# Patient Record
Sex: Female | Born: 1955 | Race: White | Hispanic: No | Smoking: Former smoker
Health system: Southern US, Community
[De-identification: ages and names within clinical notes are randomized; demographics above are authoritative.]

## PROBLEM LIST (undated history)

## (undated) DIAGNOSIS — G4730 Sleep apnea, unspecified: Secondary | ICD-10-CM

## (undated) DIAGNOSIS — T783XXA Angioneurotic edema, initial encounter: Secondary | ICD-10-CM

## (undated) DIAGNOSIS — L509 Urticaria, unspecified: Secondary | ICD-10-CM

## (undated) DIAGNOSIS — E039 Hypothyroidism, unspecified: Secondary | ICD-10-CM

## (undated) DIAGNOSIS — Z8669 Personal history of other diseases of the nervous system and sense organs: Secondary | ICD-10-CM

## (undated) DIAGNOSIS — I10 Essential (primary) hypertension: Secondary | ICD-10-CM

## (undated) DIAGNOSIS — F3181 Bipolar II disorder: Secondary | ICD-10-CM

## (undated) DIAGNOSIS — I313 Pericardial effusion (noninflammatory): Secondary | ICD-10-CM

## (undated) DIAGNOSIS — F329 Major depressive disorder, single episode, unspecified: Secondary | ICD-10-CM

## (undated) DIAGNOSIS — F410 Panic disorder [episodic paroxysmal anxiety] without agoraphobia: Secondary | ICD-10-CM

## (undated) DIAGNOSIS — R000 Tachycardia, unspecified: Secondary | ICD-10-CM

## (undated) DIAGNOSIS — T8859XA Other complications of anesthesia, initial encounter: Secondary | ICD-10-CM

## (undated) DIAGNOSIS — T56891A Toxic effect of other metals, accidental (unintentional), initial encounter: Secondary | ICD-10-CM

## (undated) DIAGNOSIS — G4733 Obstructive sleep apnea (adult) (pediatric): Secondary | ICD-10-CM

## (undated) DIAGNOSIS — E785 Hyperlipidemia, unspecified: Secondary | ICD-10-CM

## (undated) DIAGNOSIS — I309 Acute pericarditis, unspecified: Secondary | ICD-10-CM

## (undated) DIAGNOSIS — F419 Anxiety disorder, unspecified: Secondary | ICD-10-CM

## (undated) DIAGNOSIS — T4145XA Adverse effect of unspecified anesthetic, initial encounter: Secondary | ICD-10-CM

## (undated) HISTORY — PX: CERVICAL FUSION: SHX112

## (undated) HISTORY — DX: Obstructive sleep apnea (adult) (pediatric): G47.33

## (undated) HISTORY — PX: VAGINAL HYSTERECTOMY: SUR661

## (undated) HISTORY — DX: Angioneurotic edema, initial encounter: T78.3XXA

## (undated) HISTORY — DX: Tachycardia, unspecified: R00.0

## (undated) HISTORY — PX: NASAL SINUS SURGERY: SHX719

## (undated) HISTORY — DX: Toxic effect of other metals, accidental (unintentional), initial encounter: T56.891A

## (undated) HISTORY — DX: Bipolar II disorder: F31.81

## (undated) HISTORY — DX: Urticaria, unspecified: L50.9

## (undated) HISTORY — DX: Panic disorder (episodic paroxysmal anxiety): F41.0

## (undated) HISTORY — PX: BREAST SURGERY: SHX581

## (undated) HISTORY — PX: CHOLECYSTECTOMY: SHX55

## (undated) SURGERY — COLONOSCOPY
Anesthesia: Moderate Sedation

---

## 2012-11-02 DIAGNOSIS — R079 Chest pain, unspecified: Secondary | ICD-10-CM

## 2013-04-16 ENCOUNTER — Ambulatory Visit (INDEPENDENT_AMBULATORY_CARE_PROVIDER_SITE_OTHER): Payer: Medicare Other | Admitting: Psychiatry

## 2013-04-16 ENCOUNTER — Encounter (HOSPITAL_COMMUNITY): Payer: Self-pay | Admitting: Psychiatry

## 2013-04-16 VITALS — BP 140/80 | Ht 67.0 in | Wt 157.0 lb

## 2013-04-16 DIAGNOSIS — F332 Major depressive disorder, recurrent severe without psychotic features: Secondary | ICD-10-CM

## 2013-04-16 DIAGNOSIS — F32A Depression, unspecified: Secondary | ICD-10-CM

## 2013-04-16 DIAGNOSIS — F329 Major depressive disorder, single episode, unspecified: Secondary | ICD-10-CM

## 2013-04-16 HISTORY — DX: Major depressive disorder, single episode, unspecified: F32.9

## 2013-04-16 MED ORDER — ALPRAZOLAM 1 MG PO TABS
2.0000 mg | ORAL_TABLET | Freq: Every day | ORAL | Status: DC
Start: 2013-04-16 — End: 2013-04-16

## 2013-04-16 MED ORDER — VENLAFAXINE HCL ER 75 MG PO CP24: ORAL_CAPSULE | ORAL | Status: DC

## 2013-04-16 MED ORDER — ARIPIPRAZOLE 5 MG PO TABS: 5.0000 mg | ORAL_TABLET | Freq: Every day | ORAL | Status: DC

## 2013-04-16 MED ORDER — BUPROPION HCL ER (SR) 150 MG PO TB12: 150.0000 mg | ORAL_TABLET | Freq: Every day | ORAL | Status: DC

## 2013-04-16 MED ORDER — ALPRAZOLAM 1 MG PO TABS: 2.0000 mg | ORAL_TABLET | Freq: Every day | ORAL | Status: DC
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
## 2013-05-04 ENCOUNTER — Ambulatory Visit (INDEPENDENT_AMBULATORY_CARE_PROVIDER_SITE_OTHER): Payer: Medicare Other | Admitting: Psychiatry

## 2013-05-04 ENCOUNTER — Encounter (HOSPITAL_COMMUNITY): Payer: Self-pay | Admitting: Psychiatry

## 2013-05-04 DIAGNOSIS — F339 Major depressive disorder, recurrent, unspecified: Secondary | ICD-10-CM
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
## 2013-05-04 NOTE — Patient Instructions (Signed)
Discussed orally 

## 2013-05-14 ENCOUNTER — Encounter (HOSPITAL_COMMUNITY): Payer: Self-pay | Admitting: Psychiatry

## 2013-05-14 ENCOUNTER — Ambulatory Visit (INDEPENDENT_AMBULATORY_CARE_PROVIDER_SITE_OTHER): Payer: Medicare Other | Admitting: Psychiatry

## 2013-05-14 VITALS — BP 120/80 | Ht 67.0 in | Wt 160.0 lb

## 2013-05-14 DIAGNOSIS — F339 Major depressive disorder, recurrent, unspecified: Secondary | ICD-10-CM

## 2013-05-14 DIAGNOSIS — F332 Major depressive disorder, recurrent severe without psychotic features: Secondary | ICD-10-CM

## 2013-05-14 MED ORDER — VENLAFAXINE HCL ER 225 MG PO TB24: 225.0000 mg | ORAL_TABLET | ORAL | Status: DC
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
## 2013-05-19 ENCOUNTER — Ambulatory Visit (HOSPITAL_COMMUNITY): Payer: Self-pay | Admitting: Psychiatry

## 2013-05-28 ENCOUNTER — Ambulatory Visit (INDEPENDENT_AMBULATORY_CARE_PROVIDER_SITE_OTHER): Payer: Medicare Other | Admitting: Psychiatry

## 2013-05-28 DIAGNOSIS — F339 Major depressive disorder, recurrent, unspecified: Secondary | ICD-10-CM
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
## 2013-05-28 NOTE — Patient Instructions (Signed)
Discussed orally 

## 2013-06-16 ENCOUNTER — Ambulatory Visit (INDEPENDENT_AMBULATORY_CARE_PROVIDER_SITE_OTHER): Payer: Medicare Other | Admitting: Psychiatry

## 2013-06-16 DIAGNOSIS — F339 Major depressive disorder, recurrent, unspecified: Secondary | ICD-10-CM | POA: Diagnosis not present

## 2013-06-17 NOTE — Patient Instructions (Signed)
Discussed orally 

## 2013-06-17 NOTE — Progress Notes (Signed)
   THERAPIST PROGRESS NOTE  Session Time: Wednesday 06/16/2013 3:15 PM - 4:00 PM  Participation Level: Active  Behavioral Response: CasualAlertEuthymic  Type of Therapy: Individual Therapy  Treatment Goals addressed:  Improve ability to identify and verbalize anger and underlying feelings  Interventions: CBT and Supportive  Summary: Teresa Galloway is a 58 y.o. female who presents with  with a long-standing history of recurrent periods of depression and one psychiatric hospitalization.. She reports most recent episode was in 2012 but reports worsening symptoms began about 3-4 months ago  Patient reports ongoing stress and financial issues related to separation from her husband in 2013. However, she has experienced decreased anger and anxiety in the past 2-3 weeks as husband has been more cooperative and prompt regarding financial support. She stills worry about the future as she does know if husband will remain cooperative. Patient reports she has begun dating. She also reports improved concentration stating that she has resumed her interest in reading. She expresses frustration with self due to poor motivation regarding completing certain tasks. She also continues to have difficulty identifying and managing emotions especially anger as she reports tendency to internalize.  .   Suicidal/Homicidal: No  Therapist Response: Therapist works with patient to develop treatment plan, explore thinking patterns and effects on mood and behavior, identify realistic expectations of self, and increase emotional awareness.  Plan: Return again in 3 weeks.  Diagnosis: Axis I: Major depressive disorder    Axis II: Deferred    Teresa Labus, LCSW 06/17/2013

## 2013-06-24 ENCOUNTER — Ambulatory Visit (INDEPENDENT_AMBULATORY_CARE_PROVIDER_SITE_OTHER): Payer: Medicare Other | Admitting: Psychiatry

## 2013-06-24 ENCOUNTER — Encounter (HOSPITAL_COMMUNITY): Payer: Self-pay | Admitting: Psychiatry

## 2013-06-24 VITALS — BP 130/80 | Ht 67.0 in | Wt 158.0 lb

## 2013-06-24 DIAGNOSIS — F339 Major depressive disorder, recurrent, unspecified: Secondary | ICD-10-CM

## 2013-06-24 DIAGNOSIS — F332 Major depressive disorder, recurrent severe without psychotic features: Secondary | ICD-10-CM

## 2013-06-24 MED ORDER — VENLAFAXINE HCL ER 225 MG PO TB24: 225.0000 mg | ORAL_TABLET | ORAL | Status: DC

## 2013-06-24 MED ORDER — ALPRAZOLAM 1 MG PO TABS: 2.0000 mg | ORAL_TABLET | Freq: Every day | ORAL | Status: DC

## 2013-06-24 MED ORDER — BUPROPION HCL ER (SR) 150 MG PO TB12: 150.0000 mg | ORAL_TABLET | Freq: Every day | ORAL | Status: DC

## 2013-06-24 MED ORDER — ARIPIPRAZOLE 5 MG PO TABS: 5.0000 mg | ORAL_TABLET | Freq: Every day | ORAL | Status: DC
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
## 2013-08-24 ENCOUNTER — Ambulatory Visit (HOSPITAL_COMMUNITY): Payer: Self-pay | Admitting: Psychiatry

## 2013-12-07 DIAGNOSIS — I309 Acute pericarditis, unspecified: Secondary | ICD-10-CM

## 2013-12-07 HISTORY — DX: Acute pericarditis, unspecified: I30.9

## 2014-01-02 ENCOUNTER — Encounter (HOSPITAL_COMMUNITY): Payer: Self-pay | Admitting: Cardiology

## 2014-01-02 ENCOUNTER — Observation Stay (HOSPITAL_COMMUNITY)
Admission: AD | Admit: 2014-01-02 | Discharge: 2014-01-03 | Disposition: A | Discharge status: Home / Self Care | Payer: Medicare Other | Source: Other Acute Inpatient Hospital | Attending: Cardiology | Admitting: Cardiology

## 2014-01-02 DIAGNOSIS — I4581 Long QT syndrome: Secondary | ICD-10-CM

## 2014-01-02 DIAGNOSIS — I3139 Other pericardial effusion (noninflammatory): Secondary | ICD-10-CM | POA: Diagnosis present

## 2014-01-02 DIAGNOSIS — Z79899 Other long term (current) drug therapy: Secondary | ICD-10-CM | POA: Diagnosis not present

## 2014-01-02 DIAGNOSIS — R072 Precordial pain: Secondary | ICD-10-CM | POA: Diagnosis not present

## 2014-01-02 DIAGNOSIS — Z8669 Personal history of other diseases of the nervous system and sense organs: Secondary | ICD-10-CM

## 2014-01-02 DIAGNOSIS — Z87891 Personal history of nicotine dependence: Secondary | ICD-10-CM | POA: Insufficient documentation

## 2014-01-02 DIAGNOSIS — E079 Disorder of thyroid, unspecified: Secondary | ICD-10-CM | POA: Diagnosis not present

## 2014-01-02 DIAGNOSIS — I319 Disease of pericardium, unspecified: Principal | ICD-10-CM | POA: Insufficient documentation

## 2014-01-02 DIAGNOSIS — I309 Acute pericarditis, unspecified: Secondary | ICD-10-CM

## 2014-01-02 DIAGNOSIS — I313 Pericardial effusion (noninflammatory): Secondary | ICD-10-CM | POA: Diagnosis present

## 2014-01-02 DIAGNOSIS — R079 Chest pain, unspecified: Secondary | ICD-10-CM | POA: Diagnosis present

## 2014-01-02 DIAGNOSIS — E039 Hypothyroidism, unspecified: Secondary | ICD-10-CM | POA: Diagnosis present

## 2014-01-02 HISTORY — DX: Personal history of other diseases of the nervous system and sense organs: Z86.69

## 2014-01-02 HISTORY — DX: Hypothyroidism, unspecified: E03.9

## 2014-01-02 HISTORY — DX: Pericardial effusion (noninflammatory): I31.3

## 2014-01-02 HISTORY — DX: Acute pericarditis, unspecified: I30.9

## 2014-01-02 HISTORY — DX: Anxiety disorder, unspecified: F41.9

## 2014-01-02 HISTORY — DX: Hyperlipidemia, unspecified: E78.5

## 2014-01-02 HISTORY — DX: Major depressive disorder, single episode, unspecified: F32.9

## 2014-01-02 LAB — TROPONIN I: Troponin I: <0.3 ng/mL (ref <0.30)

## 2014-01-02 LAB — TSH: TSH: 2.61 u[IU]/mL (ref 0.350–4.500)

## 2014-01-02 LAB — SEDIMENTATION RATE: Sed Rate: 42 mm/hr — ABNORMAL HIGH (ref 0–22)

## 2014-01-02 MED ORDER — PROPRANOLOL HCL ER BEADS 80 MG PO CP24: 80.0000 mg | ORAL_CAPSULE | Freq: Every morning | ORAL | Status: DC

## 2014-01-02 MED ORDER — LEVOTHYROXINE SODIUM 75 MCG PO TABS
75.0000 ug | ORAL_TABLET | Freq: Every day | ORAL | Status: DC
  Administered 2014-01-02 – 2014-01-03 (×2): 75 ug via ORAL
  Filled 2014-01-02 (×3): qty 1

## 2014-01-02 MED ORDER — VENLAFAXINE HCL ER 150 MG PO CP24
300.0000 mg | ORAL_CAPSULE | Freq: Every day | ORAL | Status: DC
  Administered 2014-01-02 – 2014-01-03 (×2): 300 mg via ORAL
  Filled 2014-01-02 (×3): qty 2

## 2014-01-02 MED ORDER — MORPHINE SULFATE 2 MG/ML IJ SOLN
1.0000 mg | INTRAMUSCULAR | Status: DC | PRN
  Administered 2014-01-02 (×2): 2 mg via INTRAVENOUS
  Filled 2014-01-02 (×2): qty 1

## 2014-01-02 MED ORDER — PANTOPRAZOLE SODIUM 40 MG PO TBEC
40.0000 mg | DELAYED_RELEASE_TABLET | Freq: Every day | ORAL | Status: DC
Start: 2014-01-02 — End: 2014-01-03
  Administered 2014-01-02 – 2014-01-03 (×2): 40 mg via ORAL
  Filled 2014-01-02 (×2): qty 1

## 2014-01-02 MED ORDER — VENLAFAXINE HCL ER 150 MG PO CP24: 150.0000 mg | ORAL_CAPSULE | Freq: Two times a day (BID) | ORAL | Status: DC

## 2014-01-02 MED ORDER — COLCHICINE 0.6 MG PO TABS
0.6000 mg | ORAL_TABLET | Freq: Two times a day (BID) | ORAL | Status: DC
  Administered 2014-01-02 – 2014-01-03 (×3): 0.6 mg via ORAL
  Filled 2014-01-02 (×4): qty 1

## 2014-01-02 MED ORDER — ACETAMINOPHEN 325 MG PO TABS: 650.0000 mg | ORAL_TABLET | ORAL | Status: DC | PRN

## 2014-01-02 MED ORDER — ALPRAZOLAM 0.5 MG PO TABS
0.5000 mg | ORAL_TABLET | Freq: Two times a day (BID) | ORAL | Status: DC | PRN
  Administered 2014-01-02 – 2014-01-03 (×2): 0.5 mg via ORAL
  Filled 2014-01-02 (×2): qty 1

## 2014-01-02 MED ORDER — IBUPROFEN 400 MG PO TABS
400.0000 mg | ORAL_TABLET | Freq: Three times a day (TID) | ORAL | Status: DC
  Administered 2014-01-02 (×3): 400 mg via ORAL
  Filled 2014-01-02 (×6): qty 1

## 2014-01-02 MED ORDER — BUPROPION HCL ER (SR) 150 MG PO TB12
150.0000 mg | ORAL_TABLET | Freq: Every day | ORAL | Status: DC
  Administered 2014-01-02 – 2014-01-03 (×2): 150 mg via ORAL
  Filled 2014-01-02 (×2): qty 1

## 2014-01-02 MED ORDER — ONDANSETRON HCL 4 MG/2ML IJ SOLN: 4.0000 mg | Freq: Four times a day (QID) | INTRAMUSCULAR | Status: DC | PRN

## 2014-01-02 MED ORDER — PROPRANOLOL HCL ER 80 MG PO CP24
80.0000 mg | ORAL_CAPSULE | Freq: Every day | ORAL | Status: DC
  Administered 2014-01-02: 80 mg via ORAL
  Filled 2014-01-02 (×2): qty 1

## 2014-01-02 NOTE — Progress Notes (Signed)
  Echocardiogram 2D Echocardiogram has been performed.  Mauricio Po 01/02/2014, 9:39 AM

## 2014-01-02 NOTE — Progress Notes (Signed)
Subjective:  Still was inserted chest pain, worse if bends forward. No tachycardia.  Objective:  Vital Signs in the last 24 hours: BP 84/57  Pulse 84  Temp(Src) 97.7 F (36.5 C) (Oral)  Resp 16  Ht 5' 6.5" (1.689 m)  Wt 70.943 kg (156 lb 6.4 oz)  BMI 24.87 kg/m2  SpO2 99%  Physical Exam: Pleasant, mildly anxious white female in no acute distress Lungs:  Clear Neck: Neck veins not elevated Cardiac:  Regular rhythm, normal S1 and S2, no S3, no rub heard Abdomen:  Soft, nontender, no masses Extremities:  No edema present  Weight Filed Weights   01/02/14 0552  Weight: 70.943 kg (156 lb 6.4 oz)   Telemetry: Sinus rhythm  Assessment/Plan:  1. Pleuritic chest pain which is positional suggestive of pericarditis  2. Moderate pericardial effusion by CT scan-echo pending, blood pressure is on the low side 3. Hypothyroidism under treatment  Recommendations:  Await echocardiogram. Obtain ANA, sedimentation rate and TSH. Continue anti-inflammatory and colchicine .     Kerry Hough  MD [facility]  01/02/2014, 8:43 AM

## 2014-01-02 NOTE — Progress Notes (Signed)
Utilization Review Completed.   Zephaniah Lubrano, RN, BSN Nurse Case Manager  

## 2014-01-02 NOTE — H&P (Signed)
CARDIOLOGY ADMISSION NOTE  Patient ID: CADIENCE Galloway MRN: [id number] DOB/AGE: July 04, 1955 58 y.o.  Admit date: 01/02/2014 Primary Physician   Dr. Woody Seller Primary Cardiologist   None Chief Complaint    Chest pain  HPI:  The patient has no past cardiac history.  She did have a diagnosis of pleurisy recently.  She said that two days ago chest pain started that was the same as her pleuritic pain.  It was worse with deep breathing and bending over.  It would be 9/10 with a deep breath.  It was sharp and mid chest.  She could not take a deep breath but was not having PND or orthopnea.  She denies neck or arm pain.  She has had low grade fever.  She presented to [facility].  CT of the chest did not demonstrate PE but did suggest a moderate pericardial effusion.  Cardiac enzymes were negative x 1.  BNP was normal.     Past Medical History  Diagnosis Date  . Thyroid disease   . EPPIRJJO(841.6)     Past Surgical History  Procedure Laterality Date  . Abdominal hysterectomy    . Breast surgery    . Spine surgery    . Cholecystectomy    . Nasal sinus surgery      Allergies  Allergen Reactions  . Sulfa Antibiotics    No current facility-administered medications on file prior to encounter.   Current Outpatient Prescriptions on File Prior to Encounter  Medication Sig Dispense Refill  . ALPRAZolam (XANAX) 1 MG tablet Take 2 tablets (2 mg total) by mouth at bedtime.  60 tablet  2  . ARIPiprazole (ABILIFY) 5 MG tablet Take 1 tablet (5 mg total) by mouth daily.  30 tablet  2  . buPROPion (WELLBUTRIN SR) 150 MG 12 hr tablet Take 1 tablet (150 mg total) by mouth daily.  30 tablet  2  . esomeprazole (NEXIUM) 40 MG capsule Take 40 mg by mouth daily at 12 noon.      Marland Kitchen levothyroxine (SYNTHROID, LEVOTHROID) 75 MCG tablet Take 75 mcg by mouth daily before breakfast.      . propranolol (INNOPRAN XL) 80 MG 24 hr capsule Take 80 mg by mouth every morning.      . Venlafaxine HCl 225 MG TB24 Take 1 tablet (225  mg total) by mouth every morning.  30 each  2   History   Social History  . Marital Status: Married    Spouse Name: N/A    Number of Children: N/A  . Years of Education: N/A   Occupational History  . Not on file.   Social History Main Topics  . Smoking status: Former Research scientist (life sciences)  . Smokeless tobacco: Not on file  . Alcohol Use: 1.8 oz/week    3 Glasses of wine per week  . Drug Use: No  . Sexual Activity: No   Other Topics Concern  . Not on file   Social History Narrative  . No narrative on file    Family History  Problem Relation Age of Onset  . Depression Mother   . Depression Father   . Depression Sister   . Anxiety disorder Sister   . Alcohol abuse Maternal Uncle   . Alcohol abuse Paternal Uncle   . Alcohol abuse Maternal Grandfather   . Depression Maternal Grandmother      ROS:  Heavy sweating.  Otherwise as stated in the HPI and negative for all other systems.  Physical Exam: Blood pressure  84/57, pulse 84, temperature 97.7 F (36.5 C), temperature source Oral, height 5' 6.5" (1.689 m), weight 156 lb 6.4 oz (70.943 kg), SpO2 99.00%.  GENERAL:  Well appearing HEENT:  Pupils equal round and reactive, fundi not visualized, oral mucosa unremarkable NECK:  No jugular venous distention, waveform within normal limits, carotid upstroke brisk and symmetric, no bruits, no thyromegaly LYMPHATICS:  No cervical, inguinal adenopathy LUNGS:  Clear to auscultation bilaterally BACK:  No CVA tenderness CHEST:  Unremarkable HEART:  PMI not displaced or sustained,S1 and S2 within normal limits, no S3, no S4, no clicks, no rubs, no murmurs ABD:  Flat, positive bowel sounds normal in frequency in pitch, no bruits, no rebound, no guarding, no midline pulsatile mass, no hepatomegaly, no splenomegaly EXT:  2 plus pulses throughout, no edema, no cyanosis no clubbing SKIN:  No rashes no nodules NEURO:  Cranial nerves II through XII grossly intact, motor grossly intact throughout PSYCH:   Cognitively intact, oriented to person place and time  Labs:  Na 133, BUN 16, creat .77, potassium 4.1, WBC 11.3, Hbg 12.6, platelets 238.      Radiology:  CT:  Emphysematous changes, fibrosis, moderate effusion, no pulmonary embolism  EKG:  NSR, RATE 104, AXIS wnl, QT prolonged, nonspecific diffuse ST T wave changes.  01/01/2014  ASSESSMENT AND PLAN:    CHEST PAIN:  I suspect that this is pleuritic and related possibly to pericarditis although I do not appreciate a rub and there are no diagnostic EKG changes.  I will treat with Motrin and colchicine.  Check echocardiogram.  Continue to cycle enzymes.  PROLONGED QT:   Avoid further QT prolonging drugs.  I suspect this is med related.  She has had no symptoms to suggest syndrome.  DEPRESSION:  Continue previous meds.   SignedMinus Breeding 01/02/2014, 6:16 AM

## 2014-01-03 ENCOUNTER — Encounter (HOSPITAL_COMMUNITY): Payer: Self-pay | Admitting: Physician Assistant

## 2014-01-03 DIAGNOSIS — E785 Hyperlipidemia, unspecified: Secondary | ICD-10-CM | POA: Insufficient documentation

## 2014-01-03 DIAGNOSIS — I319 Disease of pericardium, unspecified: Secondary | ICD-10-CM | POA: Diagnosis not present

## 2014-01-03 DIAGNOSIS — I3139 Other pericardial effusion (noninflammatory): Secondary | ICD-10-CM

## 2014-01-03 DIAGNOSIS — F411 Generalized anxiety disorder: Secondary | ICD-10-CM | POA: Insufficient documentation

## 2014-01-03 DIAGNOSIS — R072 Precordial pain: Secondary | ICD-10-CM

## 2014-01-03 DIAGNOSIS — I309 Acute pericarditis, unspecified: Secondary | ICD-10-CM

## 2014-01-03 DIAGNOSIS — E039 Hypothyroidism, unspecified: Secondary | ICD-10-CM | POA: Diagnosis present

## 2014-01-03 DIAGNOSIS — I313 Pericardial effusion (noninflammatory): Secondary | ICD-10-CM

## 2014-01-03 DIAGNOSIS — Z8669 Personal history of other diseases of the nervous system and sense organs: Secondary | ICD-10-CM

## 2014-01-03 DIAGNOSIS — F419 Anxiety disorder, unspecified: Secondary | ICD-10-CM | POA: Insufficient documentation

## 2014-01-03 HISTORY — DX: Pericardial effusion (noninflammatory): I31.3

## 2014-01-03 HISTORY — DX: Other pericardial effusion (noninflammatory): I31.39

## 2014-01-03 LAB — ANA: Anti Nuclear Antibody(ANA): NEGATIVE

## 2014-01-03 MED ORDER — IBUPROFEN 600 MG PO TABS
600.0000 mg | ORAL_TABLET | Freq: Three times a day (TID) | ORAL | Status: DC
  Administered 2014-01-03: 600 mg via ORAL
  Filled 2014-01-03 (×3): qty 1

## 2014-01-03 MED ORDER — COLCHICINE 0.6 MG PO TABS: 0.6000 mg | ORAL_TABLET | Freq: Two times a day (BID) | ORAL | Status: DC

## 2014-01-03 MED ORDER — PROPRANOLOL HCL ER 60 MG PO CP24: 60.0000 mg | ORAL_CAPSULE | Freq: Every day | ORAL | Status: DC

## 2014-01-03 MED ORDER — IBUPROFEN 600 MG PO TABS: 600.0000 mg | ORAL_TABLET | Freq: Three times a day (TID) | ORAL | Status: DC

## 2014-01-03 MED ORDER — PROPRANOLOL HCL ER 60 MG PO CP24
60.0000 mg | ORAL_CAPSULE | Freq: Every day | ORAL | Status: DC
  Filled 2014-01-03: qty 1

## 2014-01-03 NOTE — Progress Notes (Signed)
  Patient: Teresa Galloway / Admit Date: 01/02/2014 / Date of Encounter: 01/03/2014, 8:00 AM   Subjective: Still with mild CP on inspiration but nowhere near where it was before. No SOB, orthopnea, LEE. Reports dx of pleurisy about a month ago with improvement, then this syndrome.   Objective: Telemetry: NSR Physical Exam: Blood pressure 95/51, pulse 88, temperature 98.3 F (36.8 C), temperature source Oral, resp. rate 18, height 5' 6.5" (1.689 m), weight 156 lb (70.761 kg), SpO2 98.00%. General: Well developed, well nourished, in no acute distress. Well-appearing. Head: Normocephalic, atraumatic, sclera non-icteric, no xanthomas, nares are without discharge. Neck: JVP not elevated. Lungs: Clear bilaterally to auscultation without wheezes, rales, or rhonchi. Breathing is unlabored. Heart: RRR S1 S2 without murmurs, rubs, or gallops.  Abdomen: Soft, non-tender, non-distended with normoactive bowel sounds. No rebound/guarding. Extremities: No clubbing or cyanosis. No edema. Distal pedal pulses are 2+ and equal bilaterally. Neuro: Alert and oriented X 3. Moves all extremities spontaneously. Psych:  Responds to questions appropriately with a normal affect.   Intake/Output Summary (Last 24 hours) at 01/03/14 0800 Last data filed at 01/02/14 2836  Gross per 24 hour  Intake    120 ml  Output      0 ml  Net    120 ml    Inpatient Medications:  . buPROPion  150 mg Oral Daily  . colchicine  0.6 mg Oral BID  . ibuprofen  400 mg Oral TID  . levothyroxine  75 mcg Oral QAC breakfast  . pantoprazole  40 mg Oral Daily  . propranolol ER  80 mg Oral Daily  . venlafaxine XR  300 mg Oral Q breakfast   Infusions:    Labs: troponins negative. ESR 32. TSH 2.610.   Recent Labs  01/02/14 0850 01/02/14 1051 01/02/14 1524  TROPONINI <0.30 <0.30 <0.30   Radiology/Studies:  2D echo 01/02/14 - Left ventricle: The cavity size was normal. Systolic function was normal. The estimated ejection  fraction was in the range of 55% to 60%. Wall motion was normal; there were no regional wall motion abnormalities. - Pericardium, extracardiac: A moderate pericardial effusion was identified. There was no echocardiographic evidence for tamponade physiology.    Assessment and Plan  1. Acute pericarditis 2. Pericardial effusion, moderate by echo 01/02/14 3. Hypothyroidism, TSH wnl 01/02/14 4. Migraines, on propranolol  ESR mildly elevated at 42. ANA pending - may not be back by time of DC. Being treated with moderate dose ibuprofen ($RemoveBeforeD'400mg'[id number]$ ) TID and colchicine. Might consider going up a little on the ibuprofen at DC. Would continue this until seen back in the office, and continue colchicine for 3 months. Echo with moderate effusion but no tamponade physiology. BP soft but not tachycardic - will cut propranolol to $RemoveBefore'60mg'[id number]$  daily. She prefers to f/u in [place] where she lives.  Signed, Melina Copa PA-C

## 2014-01-03 NOTE — Progress Notes (Signed)
Discharge Summary   Patient ID: Teresa Galloway MRN: [id number], DOB/AGE: 08/06/1955 58 y.o. Admit date: 01/02/2014 D/C date:     01/03/2014  Primary Care Provider: No PCP Per Patient Primary Cardiologist: Will be Dr. Harl Bowie in [place]  Primary Discharge Diagnoses:  1. Acute pericarditis - chest pain due to this 2. Pericardial effusion, moderate by echo 01/02/14  3. Hypothyroidism, TSH wnl 01/02/14  4. Migraines, on propranolol - dose decreased due to soft BPs  Secondary Discharge Diagnoses:  1. Reported h/o dyslipidemia 2. Depression 3. Anxiety  Hospital Course: [name] is a 58 y/o F with history of hypothyroidism, migraines, dyslipidemia, depression/anxiety who presented to [facility] initially on 01/01/2014 with chest pain. She did have a diagnosis of pleurisy recently but had improved from that. Two days prior to this admission, the chest pain started up again that was the same sharp quality as her pleuritic pain although it radiated upward to her chest instead of staying in the lower sternal region. It was worse with deep breathing up to 9/10 as well as bending over. She denied neck or arm pain. She has had low grade fever. At [facility], CT of the chest did not demonstrate PE but did suggest a moderate pericardial effusion. Cardiac enzymes were negative x 1. BNP was normal and EKG showed NSR with nonspecific ST-T changes. QTc was reported as prolonged at 503 on [facility] EKG, but handcalculated was 384m. She did not have any symptoms to suggest syndrome. WBC was 11.3. [facility]. Troponins were negative x 3. TSH was wnl. She was started on ibuproften TID and colchicine with clinical improvement in symptoms. ESR 42. 2D echo showed EF 55-60%, no RWMA, moderate pericardial effusion. She had softer BPs so her propranolol was cut from 844mto 6053maily (she takes this for migraines). She was not tachycardic and had no  evidence of tamponade or hemodynamic compromise. Today she feels much better. Dr. TurRadford Paxs seen and examined the patient today and feels she is stable for discharge. ANA is pending at time of DC.  The plan is to continue ibuprofen 600m64mD until re-evaluated in the office, and to continue colchicine for 3 months. She will be seen back in the office in about a week at which time we can determine timing of f/u echo to reassess effusion. She does not currently work so a work note was not needed. I gave her the following specific instructions on her D/c paperwork: - Since your blood pressure was on the lower side, we lowered your dose of Propranolol (new prescription). - You will continue ibuprofen every 8 hours *scheduled* until seen in the office - at your appointment, talk to your doctor about whether you can stop this medicine or taper down. Please take this with food. If you notice any unusual rectal bleeding, dark stools, or stomach upset, call your doctor. - The plan is to take colchicine for 3 months. - In general, you should be careful about taking black cohosh and ibuprofen together since black cohosh can increase risk of liver damage. If possible, please stop black cohosh while taking ibuprofen.    Discharge Vitals: Blood pressure 115/52, pulse 87, temperature 98.4 F (36.9 C), temperature source Oral, resp. rate 18, height 5' 6.5" (1.689 m), weight 156 lb (70.761 kg), SpO2 97.00%.  Labs: Albumin 4.0, alk phos 76, ALT 25, AST 18, BNP 56, BUN 16, Calcium 8.7, Cl 101, CPK  0.8, Cr 0.77, D-dimer 0.58, glucose 111, Hgb 12.6, Hct 37.5, Na 133, Total bili 1.0, Total protein 7.0, troponin negative, WBC 11.3 BNP 56   Recent Labs  01/02/14 0850 01/02/14 1051 01/02/14 1524  TROPONINI <0.30 <0.30 <0.30    Diagnostic Studies/Procedures   CTA of Chest 01/02/14: no evidence of significant PE. Moderate sized pericardial effusion. Emphysematous changes and fibrosis in the lungs. Atelectasis in the  lung bases.  CXR 01/01/14: no active CP disease.  2D Echo 01/02/14 - Left ventricle: The cavity size was normal. Systolic function was normal. The estimated ejection fraction was in the range of 55% to 60%. Wall motion was normal; there were no regional wall motion abnormalities. - Pericardium, extracardiac: A moderate pericardial effusion was identified. There was no echocardiographic evidence for tamponade physiology.  Discharge Medications   Current Discharge Medication List    START taking these medications   Details  colchicine 0.6 MG tablet Take 1 tablet (0.6 mg total) by mouth 2 (two) times daily. Qty: 60 tablet, Refills: 2    propranolol ER (INDERAL LA) 60 MG 24 hr capsule Take 1 capsule (60 mg total) by mouth daily. Qty: 30 capsule, Refills: 2      CONTINUE these medications which have CHANGED   Details  ibuprofen (ADVIL,MOTRIN) 600 MG tablet Take 1 tablet (600 mg total) by mouth every 8 (eight) hours. Qty: 45 tablet, Refills: 0      CONTINUE these medications which have NOT CHANGED   Details  ALPRAZolam (XANAX) 0.5 MG tablet Take 0.5 mg by mouth at bedtime as needed for anxiety.    Black Cohosh 40 MG CAPS Take 1 capsule by mouth 2 (two) times daily.    buPROPion (WELLBUTRIN SR) 200 MG 12 hr tablet Take 200 mg by mouth daily.    HYDROcodone-acetaminophen (NORCO/VICODIN) 5-325 MG per tablet Take 2 tablets by mouth every 6 (six) hours as needed for moderate pain.    levothyroxine (SYNTHROID, LEVOTHROID) 75 MCG tablet Take 75 mcg by mouth daily before breakfast.    pantoprazole (PROTONIX) 40 MG tablet Take 40 mg by mouth daily.    venlafaxine XR (EFFEXOR-XR) 150 MG 24 hr capsule Take 300 mg by mouth daily with breakfast.      STOP taking these medications     propranolol (INNOPRAN XL) 80 MG 24 hr capsule - replaced with 66m capsule      esomeprazole (NEXIUM) 40 MG capsule - patient now taking protonix at home         Disposition   The patient will be  discharged in stable condition to home. Discharge Instructions   Diet - low sodium heart healthy    Complete by:  As directed      Increase activity slowly    Complete by:  As directed   - Since your blood pressure was on the lower side, we lowered your dose of Propranolol (new prescription). - You will continue ibuprofen every 8 hours *scheduled* until seen in the office - at your appointment, talk to your doctor about whether you can stop this medicine or taper down. Please take this with food. If you notice any unusual rectal bleeding, dark stools, or stomach upset, call your doctor. - The plan is to take colchicine for 3 months. - In general, you should be careful about taking black cohosh and ibuprofen together since black cohosh can increase risk of liver damage. If possible, please stop black cohosh while taking ibuprofen.  Follow-up Information   Follow up with Arnoldo Lenis, MD. ([facility] office) - 01/12/14 at 2:20pm)    Specialty:  Cardiology   Contact information:   [place] [id number] [phone]         Duration of Discharge Encounter: Greater than 30 minutes including physician and PA time.  Signed, Mercie Balsley PA-C 01/03/2014, 9:12 AM

## 2014-01-03 NOTE — Progress Notes (Signed)
Patient seen and personally examined and chart reviewed.  Chest pain better this am on Ibuprofen and colchicine.  2D echo with moderate pericardial effusion with no tamponade.  EKG with nonspecific T wave changes in V1 and V2 most likely due to pericarditis.  Sed rate elevated at 42.  Cardiac enzymes neg x 3.  Symptomatically improved and stable for D/C.  She will followup with Cardiology in [place] in 1 week with repeat echo to make sure effusion is resolving.

## 2014-01-12 ENCOUNTER — Telehealth: Payer: Self-pay | Admitting: *Deleted

## 2014-01-12 ENCOUNTER — Ambulatory Visit: Payer: Self-pay | Admitting: Cardiology

## 2014-01-12 ENCOUNTER — Encounter: Payer: Self-pay | Admitting: Cardiology

## 2014-01-12 ENCOUNTER — Ambulatory Visit (INDEPENDENT_AMBULATORY_CARE_PROVIDER_SITE_OTHER): Payer: Medicare Other | Admitting: Cardiology

## 2014-01-12 VITALS — BP 118/74 | HR 108 | Ht 66.0 in | Wt 155.0 lb

## 2014-01-12 DIAGNOSIS — I313 Pericardial effusion (noninflammatory): Secondary | ICD-10-CM

## 2014-01-12 DIAGNOSIS — I309 Acute pericarditis, unspecified: Secondary | ICD-10-CM

## 2014-01-12 DIAGNOSIS — I3139 Other pericardial effusion (noninflammatory): Secondary | ICD-10-CM

## 2014-01-12 DIAGNOSIS — I319 Disease of pericardium, unspecified: Secondary | ICD-10-CM

## 2014-01-12 MED ORDER — COLCHICINE 0.6 MG PO TABS: 0.6000 mg | ORAL_TABLET | Freq: Every day | ORAL | Status: DC

## 2014-01-12 NOTE — Patient Instructions (Signed)
   Stop Ibuprofen  Decrease Colchicine to 0.6mg  once a day. New sent to pharmacy. Continue all other medications.   Your physician has requested that you have an echocardiogram. Echocardiography is a painless test that uses sound waves to create images of your heart. It provides your doctor with information about the size and shape of your heart and how well your heart's chambers and valves are working. This procedure takes approximately one hour. There are no restrictions for this procedure. Office will contact with results via phone or letter.   Your physician wants you to follow up in:  3 months.  You will receive a reminder letter in the mail one-two months in advance.  If you don't receive a letter, please call our office to schedule the follow up appointment.

## 2014-01-12 NOTE — Telephone Encounter (Signed)
It is fine to have a flu shot   Zandra Abts MD

## 2014-01-12 NOTE — Telephone Encounter (Signed)
Pt.notified

## 2014-01-12 NOTE — Telephone Encounter (Signed)
Pt called today asking if it would okay for her to have a flu shot? Pt states that while she was in the hospital she was told to hold off because of everything that was going on with her.  Please advise.

## 2014-01-12 NOTE — Progress Notes (Signed)
Clinical Summary [name] is a 58 y.o.female last seen by PA Dunn, this is our first visit together. She is seen for the following medical problems.  1. Pericarditis - seen in [facility] 12/2013 with chest pain, clinically suspicious for pericarditis. CT at the time showed a moderate pericardial effusion, cardiac enzymes negative. EKG non-specific ST/T changes. ESR 42, ANA negative. Transferred to [facility] for management.  - echo LVEF 55-60%, moderate effusion, no tamponade physiology - started on ibuprofen and colchicine for pericarditis with improved symptoms. Plan from discharge symptoms was ibuprofen 660m tid, with extended course of colchicine x 3 months. Diarrhea started a few days ago. No stomach pain, just frequent loose stools - compliant with meds. Chest pain resolved since Saturday.      Past Medical History  Diagnosis Date  . Hypothyroid   . Hx of migraines     a. On propranolol.  . Dyslipidemia   . Depression   . Anxiety   . Acute pericarditis     a. Dx 12/2013.  .Marland KitchenPericardial effusion     a. Dx 12/2013 - moderate without hemodynamic compromise.     Allergies  Allergen Reactions  . Other Swelling    Walnuts  . Seroquel [Quetiapine] Other (See Comments)    Pychosis (foggy, uncoordinated, slurred speech)  . Sulfa Antibiotics   . Sunscreens   . Tape      Current Outpatient Prescriptions  Medication Sig Dispense Refill  . ALPRAZolam (XANAX) 0.5 MG tablet Take 0.5 mg by mouth at bedtime as needed for anxiety.      . Black Cohosh 40 MG CAPS Take 1 capsule by mouth 2 (two) times daily.      .Marland KitchenbuPROPion (WELLBUTRIN SR) 200 MG 12 hr tablet Take 200 mg by mouth daily.      . colchicine 0.6 MG tablet Take 1 tablet (0.6 mg total) by mouth 2 (two) times daily.  60 tablet  2  . HYDROcodone-acetaminophen (NORCO/VICODIN) 5-325 MG per tablet Take 2 tablets by mouth every 6 (six) hours as needed for moderate pain.      .Marland Kitchenibuprofen (ADVIL,MOTRIN) 600 MG tablet Take 1  tablet (600 mg total) by mouth every 8 (eight) hours.  45 tablet  0  . levothyroxine (SYNTHROID, LEVOTHROID) 75 MCG tablet Take 75 mcg by mouth daily before breakfast.      . pantoprazole (PROTONIX) 40 MG tablet Take 40 mg by mouth daily.      . propranolol ER (INDERAL LA) 60 MG 24 hr capsule Take 1 capsule (60 mg total) by mouth daily.  30 capsule  2  . venlafaxine XR (EFFEXOR-XR) 150 MG 24 hr capsule Take 300 mg by mouth daily with breakfast.       No current facility-administered medications for this visit.     Past Surgical History  Procedure Laterality Date  . Abdominal hysterectomy    . Breast surgery    . Cervical fusion    . Cholecystectomy    . Nasal sinus surgery       Allergies  Allergen Reactions  . Other Swelling    Walnuts  . Seroquel [Quetiapine] Other (See Comments)    Pychosis (foggy, uncoordinated, slurred speech)  . Sulfa Antibiotics   . Sunscreens   . Tape       Family History  Problem Relation Age of Onset  . Depression Mother   . Depression Father   . Depression Sister   . Anxiety disorder Sister   .  Alcohol abuse Maternal Uncle   . Alcohol abuse Paternal Uncle   . Alcohol abuse Maternal Grandfather   . Depression Maternal Grandmother      Social History [name] reports that she has quit smoking. Her smoking use included Cigarettes. She has a 30 pack-year smoking history. She does not have any smokeless tobacco history on file. [name] reports that she drinks about 1.8 ounces of alcohol per week.   Review of Systems CONSTITUTIONAL: No weight loss, fever, chills, weakness or fatigue.  HEENT: Eyes: No visual loss, blurred vision, double vision or yellow sclerae.No hearing loss, sneezing, congestion, runny nose or sore throat.  SKIN: No rash or itching.  CARDIOVASCULAR: per HPI RESPIRATORY: No shortness of breath, cough or sputum.  GASTROINTESTINAL: No anorexia, nausea, vomiting or diarrhea. No abdominal pain or blood.    GENITOURINARY: No burning on urination, no polyuria NEUROLOGICAL: No headache, dizziness, syncope, paralysis, ataxia, numbness or tingling in the extremities. No change in bowel or bladder control.  MUSCULOSKELETAL: No muscle, back pain, joint pain or stiffness.  LYMPHATICS: No enlarged nodes. No history of splenectomy.  PSYCHIATRIC: No history of depression or anxiety.  ENDOCRINOLOGIC: No reports of sweating, cold or heat intolerance. No polyuria or polydipsia.  Marland Kitchen   Physical Examination p 108 bp 118/74 Wt 155 lbs BMI 25 Gen: resting comfortably, no acute distress HEENT: no scleral icterus, pupils equal round and reactive, no palptable cervical adenopathy,  CV: regular, rate 105, no m/r/g, no JVD Resp: Clear to auscultation bilaterally GI: abdomen is soft, non-tender, non-distended, normal bowel sounds, no hepatosplenomegaly MSK: extremities are warm, no edema.  Skin: warm, no rash Neuro:  no focal deficits Psych: appropriate affect   Diagnostic Studies 01/02/14 Echo Study Conclusions  - Left ventricle: The cavity size was normal. Systolic function was normal. The estimated ejection fraction was in the range of 55% to 60%. Wall motion was normal; there were no regional wall motion abnormalities. - Pericardium, extracardiac: A moderate pericardial effusion was identified. There was no echocardiographic evidence for tamponade physiology.      Assessment and Plan  1. Acute Pericarditis - likely idiopathic/viral, symptoms have resolved on NSAIDs and colchicine. Wills top ibuprofen, continue colchicine for  61month, will decrease to once daily due to diarrhea - repeat echo next week to reassess effusion size  2. Sinus tachycardia - patient very anxious and stressed today regarding her health. Also likely dehydrated from ongoing diarrhea. Denies any lightheadedness, dizziness, or SOB.  Encouraged to drink more fluids, includin electrolyte rich fluids.   F/u 3  months     JArnoldo Lenis M.D.

## 2014-01-27 ENCOUNTER — Other Ambulatory Visit (INDEPENDENT_AMBULATORY_CARE_PROVIDER_SITE_OTHER): Payer: Medicare Other

## 2014-01-27 ENCOUNTER — Other Ambulatory Visit: Payer: Self-pay

## 2014-01-27 DIAGNOSIS — I313 Pericardial effusion (noninflammatory): Secondary | ICD-10-CM

## 2014-01-27 DIAGNOSIS — I319 Disease of pericardium, unspecified: Secondary | ICD-10-CM

## 2014-01-27 DIAGNOSIS — I3139 Other pericardial effusion (noninflammatory): Secondary | ICD-10-CM

## 2014-02-02 ENCOUNTER — Telehealth: Payer: Self-pay | Admitting: *Deleted

## 2014-02-02 NOTE — Telephone Encounter (Signed)
Message copied by Merlene Laughter on Wed Feb 02, 2014  1:29 PM ------      Message from: [place] F      Created: Mon Jan 31, 2014 10:32 AM       Echo looks good, the fluid around her heart is nearly gone. Continue current meds            Zandra Abts MD ------

## 2014-02-02 NOTE — Telephone Encounter (Signed)
Patient informed. 

## 2014-03-01 ENCOUNTER — Encounter (HOSPITAL_COMMUNITY): Payer: Self-pay | Admitting: Emergency Medicine

## 2014-03-01 ENCOUNTER — Emergency Department (HOSPITAL_COMMUNITY)
Admission: EM | Admit: 2014-03-01 | Discharge: 2014-03-01 | Discharge status: Against Medical Advice | Payer: Medicare Other | Attending: Emergency Medicine | Admitting: Emergency Medicine

## 2014-03-01 ENCOUNTER — Emergency Department (HOSPITAL_COMMUNITY): Payer: Medicare Other

## 2014-03-01 DIAGNOSIS — F419 Anxiety disorder, unspecified: Secondary | ICD-10-CM | POA: Diagnosis not present

## 2014-03-01 DIAGNOSIS — I3139 Other pericardial effusion (noninflammatory): Secondary | ICD-10-CM

## 2014-03-01 DIAGNOSIS — R000 Tachycardia, unspecified: Secondary | ICD-10-CM | POA: Insufficient documentation

## 2014-03-01 DIAGNOSIS — Z87891 Personal history of nicotine dependence: Secondary | ICD-10-CM | POA: Diagnosis not present

## 2014-03-01 DIAGNOSIS — F329 Major depressive disorder, single episode, unspecified: Secondary | ICD-10-CM | POA: Insufficient documentation

## 2014-03-01 DIAGNOSIS — Z79899 Other long term (current) drug therapy: Secondary | ICD-10-CM | POA: Diagnosis not present

## 2014-03-01 DIAGNOSIS — I313 Pericardial effusion (noninflammatory): Secondary | ICD-10-CM | POA: Diagnosis not present

## 2014-03-01 DIAGNOSIS — E039 Hypothyroidism, unspecified: Secondary | ICD-10-CM | POA: Insufficient documentation

## 2014-03-01 DIAGNOSIS — I471 Supraventricular tachycardia: Secondary | ICD-10-CM

## 2014-03-01 DIAGNOSIS — R079 Chest pain, unspecified: Secondary | ICD-10-CM

## 2014-03-01 DIAGNOSIS — I319 Disease of pericardium, unspecified: Secondary | ICD-10-CM | POA: Insufficient documentation

## 2014-03-01 DIAGNOSIS — I309 Acute pericarditis, unspecified: Secondary | ICD-10-CM | POA: Insufficient documentation

## 2014-03-01 DIAGNOSIS — G43909 Migraine, unspecified, not intractable, without status migrainosus: Secondary | ICD-10-CM | POA: Insufficient documentation

## 2014-03-01 LAB — COMPREHENSIVE METABOLIC PANEL
ALBUMIN: 3.8 g/dL (ref 3.5–5.2)
ALT: 18 U/L (ref 0–35)
ANION GAP: 15 (ref 5–15)
AST: 18 U/L (ref 0–37)
Alkaline Phosphatase: 124 U/L — ABNORMAL HIGH (ref 39–117)
BUN: 18 mg/dL (ref 6–23)
CO2: 22 mEq/L (ref 19–32)
CREATININE: 0.69 mg/dL (ref 0.50–1.10)
Calcium: 9.3 mg/dL (ref 8.4–10.5)
Chloride: 99 mEq/L (ref 96–112)
GFR calc Af Amer: >90 mL/min (ref >90)
GFR calc non Af Amer: >90 mL/min (ref >90)
Glucose, Bld: 126 mg/dL — ABNORMAL HIGH (ref 70–99)
POTASSIUM: 3.9 meq/L (ref 3.7–5.3)
Sodium: 136 mEq/L — ABNORMAL LOW (ref 137–147)
Total Bilirubin: <0.2 mg/dL — ABNORMAL LOW (ref 0.3–1.2)
Total Protein: 7.1 g/dL (ref 6.0–8.3)

## 2014-03-01 LAB — CBC WITH DIFFERENTIAL/PLATELET
Basophils Absolute: 0 10*3/uL (ref 0.0–0.1)
Basophils Relative: 0 % (ref 0–1)
Eosinophils Absolute: 0.2 10*3/uL (ref 0.0–0.7)
Eosinophils Relative: 2 % (ref 0–5)
HEMATOCRIT: 41.8 % (ref 36.0–46.0)
HEMOGLOBIN: 13.7 g/dL (ref 12.0–15.0)
LYMPHS PCT: 19 % (ref 12–46)
Lymphs Abs: 2.8 10*3/uL (ref 0.7–4.0)
MCH: 28.8 pg (ref 26.0–34.0)
MCHC: 32.8 g/dL (ref 30.0–36.0)
MCV: 88 fL (ref 78.0–100.0)
MONO ABS: 0.8 10*3/uL (ref 0.1–1.0)
MONOS PCT: 6 % (ref 3–12)
NEUTROS ABS: 10.8 10*3/uL — AB (ref 1.7–7.7)
Neutrophils Relative %: 73 % (ref 43–77)
Platelets: 289 10*3/uL (ref 150–400)
RBC: 4.75 MIL/uL (ref 3.87–5.11)
RDW: 14 % (ref 11.5–15.5)
WBC: 14.7 10*3/uL — AB (ref 4.0–10.5)

## 2014-03-01 LAB — SEDIMENTATION RATE: Sed Rate: 11 mm/hr (ref 0–22)

## 2014-03-01 LAB — TROPONIN I: Troponin I: <0.3 ng/mL (ref <0.30)

## 2014-03-01 NOTE — ED Notes (Signed)
Pt has been upstairs here at [facility] since Nov. 5th with a few days off to stay with her mom.  States today around 9am her chest began to hurt.."stabbing and in waves" stating it's similar to previous pericarditis episodes.  States she is under extreme stress as well.  She got her flu shot this am and is wondering if it's from that.  Denies any other symptoms.

## 2014-03-01 NOTE — Progress Notes (Signed)
Echocardiogram 2D Echocardiogram has been performed.  Joelene Millin 03/01/2014, 5:41 PM

## 2014-03-01 NOTE — Progress Notes (Signed)
Addendum to the consult note; Stat echo performed in the ER showed slightly increased amount of the pericardial effusion, now moderate circumferential, however no signs of tamponade.  The patient can be discharged home with Ibuprofen 400 mg po TID and colchicine 0.6 mg po BID. We will arrange for a follow up appointment with Dr Harl Bowie.  Dorothy Spark 03/01/2014

## 2014-03-01 NOTE — ED Notes (Signed)
Pt had just taken one of her own xanax pills and states her chest pain is starting to subside unless she takes a deep breath, then she can feel it.

## 2014-03-01 NOTE — ED Provider Notes (Signed)
CSN: [id number]     Arrival date & time 03/01/14  1407 History   First MD Initiated Contact with Patient 03/01/14 1500     Chief Complaint  Patient presents with  . Chest Pain     (Consider location/radiation/quality/duration/timing/severity/associated sxs/prior Treatment) HPI  Patient reports this morning about 9 AM while she was taking care of her mother who is been hospitalized since November 2 she started getting a pain in the center of her chest that radiates upward towards the suprasternal notch and sometimes to the left upper chest region. She describes the pain as stabbing. When she has the pain deep breathing makes the pain worse. She states the pain comes and goes off and on in waves and the pain itself only last about 2-3 seconds at a time. She denies any fever. She has had a cough for the past 3 weeks and is coughing up clear sputum. She also has been having clear rhinorrhea and some sneezing that she attributes to her allergies. She has been taking Allegra and Benadryl for that. She denies sore throat, pain or swelling her extremities. She states nothing she does makes the pain come on, nothing she does makes the pain feel better. She states she had this pain before when she was diagnosed at the end of September with pericarditis with pericardial effusion. She was just released recently by her cardiologist. She was taking ibuprofen and colchicine, she now is still taking the colchicine for the next couple of months.   PCP [facility] Dr Percival Spanish  Past Medical History  Diagnosis Date  . Hypothyroid   . Hx of migraines     a. On propranolol.  . Dyslipidemia   . Depression   . Anxiety   . Acute pericarditis     a. Dx 12/2013.  Marland Kitchen Pericardial effusion     a. Dx 12/2013 - moderate without hemodynamic compromise.   Past Surgical History  Procedure Laterality Date  . Abdominal hysterectomy    . Breast surgery    . Cervical fusion    . Cholecystectomy    . Nasal sinus  surgery     Family History  Problem Relation Age of Onset  . Depression Mother   . Depression Father   . Depression Sister   . Anxiety disorder Sister   . Alcohol abuse Maternal Uncle   . Alcohol abuse Paternal Uncle   . Alcohol abuse Maternal Grandfather   . Depression Maternal Grandmother    History  Substance Use Topics  . Smoking status: Former Smoker -- 1.00 packs/day for 30 years    Types: Cigarettes    Start date: 05/13/1975    Quit date: 07/07/2012  . Smokeless tobacco: Never Used     Comment: Quit 1 year ago  . Alcohol Use: 1.8 oz/week    3 Glasses of wine per week   On disability for mental health  OB History    No data available     Review of Systems  All other systems reviewed and are negative.     Allergies  Other; Sulfa antibiotics; Sunscreens; Seroquel; and Tape  Home Medications   Prior to Admission medications   Medication Sig Start Date End Date Taking? Authorizing Provider  diphenhydrAMINE (BENADRYL) 50 MG capsule Take 50 mg by mouth every 6 (six) hours as needed for allergies.   Yes Historical Provider, MD  fexofenadine (ALLEGRA) 180 MG tablet Take 180 mg by mouth daily.   Yes Historical Provider, MD  levothyroxine (SYNTHROID,  LEVOTHROID) 75 MCG tablet Take 75 mcg by mouth daily before breakfast.   Yes Historical Provider, MD  ALPRAZolam (XANAX) 0.5 MG tablet Take 0.5 mg by mouth at bedtime as needed for anxiety.    Historical Provider, MD  buPROPion (WELLBUTRIN SR) 200 MG 12 hr tablet Take 200 mg by mouth daily.    Historical Provider, MD  colchicine 0.6 MG tablet Take 1 tablet (0.6 mg total) by mouth daily. 01/12/14   Arnoldo Lenis, MD  Homeopathic Products (LEG CRAMP COMPLEX PO) Take 1 capsule by mouth daily as needed.    Historical Provider, MD  HYDROcodone-acetaminophen (NORCO/VICODIN) 5-325 MG per tablet Take 2 tablets by mouth every 6 (six) hours as needed for moderate pain.    Historical Provider, MD  pantoprazole (PROTONIX) 40 MG  tablet Take 40 mg by mouth daily.    Historical Provider, MD  propranolol ER (INDERAL LA) 60 MG 24 hr capsule Take 1 capsule (60 mg total) by mouth daily. 01/03/14   Dayna N Dunn, PA-C  venlafaxine XR (EFFEXOR-XR) 150 MG 24 hr capsule Take 300 mg by mouth daily with breakfast.    Historical Provider, MD   BP 129/66 mmHg  Pulse 102  Temp(Src) 98.6 F (37 C) (Oral)  Resp 18  Wt 155 lb (70.308 kg)  SpO2 96%  Vital signs normal except tachcardia  Physical Exam  Constitutional: She is oriented to person, place, and time. She appears well-developed and well-nourished.  Non-toxic appearance. She does not appear ill. No distress.  HENT:  Head: Normocephalic and atraumatic.  Right Ear: External ear normal.  Left Ear: External ear normal.  Nose: Nose normal. No mucosal edema or rhinorrhea.  Mouth/Throat: Oropharynx is clear and moist and mucous membranes are normal. No dental abscesses or uvula swelling.  Eyes: Conjunctivae and EOM are normal. Pupils are equal, round, and reactive to light.  Neck: Normal range of motion and full passive range of motion without pain. Neck supple.  Cardiovascular: Normal rate, regular rhythm and normal heart sounds.  Exam reveals no gallop and no friction rub.   No murmur heard. No rubs heard listening while laying down and sitting up  Pulmonary/Chest: Effort normal and breath sounds normal. No respiratory distress. She has no decreased breath sounds. She has no wheezes. She has no rhonchi. She has no rales. She exhibits no tenderness and no crepitus.  Abdominal: Soft. Normal appearance and bowel sounds are normal. She exhibits no distension. There is no tenderness. There is no rebound and no guarding.  Musculoskeletal: Normal range of motion. She exhibits no edema or tenderness.  Moves all extremities well.   Neurological: She is alert and oriented to person, place, and time. She has normal strength. No cranial nerve deficit.  Skin: Skin is warm, dry and intact.  No rash noted. No erythema. No pallor.  Psychiatric: She has a normal mood and affect. Her speech is normal and behavior is normal. Her mood appears not anxious.  Nursing note and vitals reviewed.   ED Course  Procedures (including critical care time)  Plan I first entered the room the patient was wanting to leave without getting any testing done, she states the nurse tried twice to get an IV and blood work without success. She then said she would stay as long she did not have to have an IV placed. Nurse reports patient was extremely anxious. Patient took one of her own Xanax.  17:30 Pt has been seen by Cardiology who have arranged for her  to get echocardiogram tonight.  Nurses report pt is wanting to leave to go upstairs to stay with her mother.   17:45 the echo tech is arriving to [facility] to do her test, pt is going to stay.   18:50 Echo has been read by Dr Meda Coffee, will call her to see what her plan will be with these echo results.   22:50 Dr Meda Coffee did not call back, pt left AMA at 19:42.    Transthoracic Echocardiography  Patient:  Bobetta, Korf MR #:    [id number] Study Date: 03/01/2014 Gender:   F Age:    8 Height:   167.6 cm Weight:   70.3 kg BSA:    1.82 m^2 Pt. Status: Room:  ATTENDING  Janice Norrie SONOGRAPHER Jimmy Reel, RDCS ORDERING   Ena Dawley, M.D. PERFORMING  [facility], Inpatient  cc:  ------------------------------------------------------------------- LV EF: 60% -  65%  ------------------------------------------------------------------- Indications:   Pericardial effusion 423.9. Pericarditis - acute idiopathic 420.91.  ------------------------------------------------------------------- History:  PMH:  Chest pain. Tachycardia. Risk factors: Dyslipidemia.  ------------------------------------------------------------------- Study Conclusions  - Left ventricle: The cavity size was normal. Systolic function  was normal. The estimated ejection fraction was in the range of 60% to 65%. Wall motion was normal; there were no regional wall motion abnormalities. Doppler parameters are consistent with abnormal left ventricular relaxation (grade 1 diastolic dysfunction). - Pericardium, extracardiac: A moderate pericardial effusion was identified circumferential to the heart. The fluid exhibited a fibrinous appearance.There was no evidence of hemodynamic compromise. There was no chamber collapse. Respirophasic change in stroke volume was normal. Features were not consistent with tamponade physiology.  Impressions:  - Compared to the prior study, there has been a slight increase in pericardial effusion along the posterior wall.   Labs Review Results for orders placed or performed during the hospital encounter of 03/01/14  Troponin I  Result Value Ref Range   Troponin I <0.30 <0.30 ng/mL  Comprehensive metabolic panel  Result Value Ref Range   Sodium 136 (L) 137 - 147 mEq/L   Potassium 3.9 3.7 - 5.3 mEq/L   Chloride 99 96 - 112 mEq/L   CO2 22 19 - 32 mEq/L   Glucose, Bld 126 (H) 70 - 99 mg/dL   BUN 18 6 - 23 mg/dL   Creatinine, Ser 0.69 0.50 - 1.10 mg/dL   Calcium 9.3 8.4 - 10.5 mg/dL   Total Protein 7.1 6.0 - 8.3 g/dL   Albumin 3.8 3.5 - 5.2 g/dL   AST 18 0 - 37 U/L   ALT 18 0 - 35 U/L   Alkaline Phosphatase 124 (H) 39 - 117 U/L   Total Bilirubin <0.2 (L) 0.3 - 1.2 mg/dL   GFR calc non Af Amer >90 >90 mL/min   GFR calc Af Amer >90 >90 mL/min   Anion gap 15 5 - 15  CBC with Differential  Result Value Ref Range   WBC 14.7 (H) 4.0 - 10.5 K/uL   RBC 4.75 3.87 - 5.11 MIL/uL   Hemoglobin 13.7 12.0 - 15.0 g/dL   HCT 41.8 36.0 - 46.0 %   MCV 88.0 78.0 - 100.0 fL   MCH 28.8 26.0 - 34.0 pg   MCHC 32.8 30.0 - 36.0 g/dL   RDW 14.0 11.5 - 15.5 %   Platelets 289 150 - 400 K/uL   Neutrophils Relative % 73 43 - 77 %   Neutro Abs 10.8 (H) 1.7 - 7.7 K/uL   Lymphocytes  Relative 19 12 - 46 %  Lymphs Abs 2.8 0.7 - 4.0 K/uL   Monocytes Relative 6 3 - 12 %   Monocytes Absolute 0.8 0.1 - 1.0 K/uL   Eosinophils Relative 2 0 - 5 %   Eosinophils Absolute 0.2 0.0 - 0.7 K/uL   Basophils Relative 0 0 - 1 %   Basophils Absolute 0.0 0.0 - 0.1 K/uL  Sedimentation rate  Result Value Ref Range   Sed Rate 11 0 - 22 mm/hr   Laboratory interpretation all normal except leukocytosis, SED rate now normal.      Imaging Review Dg Chest 2 View  03/01/2014   CLINICAL DATA:  Left upper chest pain, history of pericardial effusion  EXAM: CHEST  2 VIEW  COMPARISON:  01/01/2014  FINDINGS: Cardiomediastinal silhouette is unremarkable. No acute infiltrate or pleural effusion. No pulmonary edema. Bony thorax is unremarkable. Again noted metallic fixation plate cervical spine.  IMPRESSION: No active cardiopulmonary disease.   Electronically Signed   By: Lahoma Crocker M.D.   On: 03/01/2014 16:09     EKG Interpretation   Date/Time:  Tuesday March 01 2014 14:15:45 EST Ventricular Rate:  121 PR Interval:  174 QRS Duration: 66 QT Interval:  386 QTC Calculation: 548 R Axis:   69 Text Interpretation:  Sinus tachycardia Probable left atrial enlargement  Low voltage, precordial leads Borderline repolarization abnormality  Prolonged QT interval Since last tracing rate faster Confirmed by MILLER   MD, BRIAN ([id number]) on 03/01/2014 2:56:49 PM      MDM   Final diagnoses:  Chest pain, unspecified chest pain type  Chest pain  Pericarditis  Pericardial effusion    Pt left AMA  Rolland Porter, MD, Abram Sander     Janice Norrie, MD 03/01/14 2307

## 2014-03-01 NOTE — ED Notes (Signed)
Attempted to start IV with lab draw x 2 without success.  Pt had been very anxious about being stuck.

## 2014-03-01 NOTE — ED Notes (Signed)
Patient wanting to leave to go be with her mother who is in hospital upstairs. Patient reassured that ECG tech is here and she should stay to have test done. Agreed that she would stay a little while longer.

## 2014-03-01 NOTE — Consult Note (Signed)
CARDIOLOGY CONSULT NOTE   Patient ID: Teresa Galloway MRN: [id number], DOB/AGE: [birth year]   Admit date: 03/01/2014 Date of Consult: 03/01/2014  Primary Physician: Glenda Chroman., MD Primary Cardiologist: Dr Carlyle Dolly  Reason for consult:  Chest pain   Problem List  Past Medical History  Diagnosis Date  . Hypothyroid   . Hx of migraines     a. On propranolol.  . Dyslipidemia   . Depression   . Anxiety   . Acute pericarditis     a. Dx 12/2013.  Marland Kitchen Pericardial effusion     a. Dx 12/2013 - moderate without hemodynamic compromise.    Past Surgical History  Procedure Laterality Date  . Abdominal hysterectomy    . Breast surgery    . Cervical fusion    . Cholecystectomy    . Nasal sinus surgery       Allergies  Allergies  Allergen Reactions  . Other Swelling    Walnuts  . Sulfa Antibiotics Shortness Of Breath    Swelling of lips   . Sunscreens Anaphylaxis and Hives    Titanium and zinc oxides Eye swelling   . Seroquel [Quetiapine] Other (See Comments)    Pychosis (foggy, uncoordinated, slurred speech)  . Tape Hives    HPI   [name] is a 58 y.o.female with h/o acute pericarditis , seen in [place] 12/2013 with chest pain, clinically suspicious for pericarditis. CT at the time showed a moderate pericardial effusion, cardiac enzymes negative. EKG non-specific ST/T changes. ESR 42, ANA negative. Transferred to [facility] for management. Echo showed LVEF 55-60%, moderate effusion, no tamponade physiology, she was started on ibuprofen and colchicine for pericarditis with improved symptoms.  She was seen by Dr Harl Bowie on 01/12/14 in the clinic her pain has resolved at that time. No stomach pain, just frequent loose stools. She was continued on Colchicine. She was tachycardic at the time - believed to be sec to dehydration from diarrhea.  Repeat echo showed normal systolic function, LVEF  83% to 65%. Wall motion wasnormal; there were no regional wall motion  abnormalities.Only trivial pericardial effusion was identified.  Today she presented to [facility] ER with recurring chest pain. Pt has been in [place] visiting her mother today when around 9 am her chest began to hurt.."stabbing and in waves" stating it's similar to previous pericarditis episodes. States she is under extreme stress as well. She got her flu shot this am and is wondering if it's from that. Denies any other symptoms.The last episode of pain 5 minutes ago, no associated SOB, dizziness or palpitations.  Inpatient Medications  Family History Family History  Problem Relation Age of Onset  . Depression Mother   . Depression Father   . Depression Sister   . Anxiety disorder Sister   . Alcohol abuse Maternal Uncle   . Alcohol abuse Paternal Uncle   . Alcohol abuse Maternal Grandfather   . Depression Maternal Grandmother      Social History History   Social History  . Marital Status: Married    Spouse Name: N/A    Number of Children: N/A  . Years of Education: N/A   Occupational History  . Not on file.   Social History Main Topics  . Smoking status: Former Smoker -- 1.00 packs/day for 30 years    Types: Cigarettes    Start date: 05/13/1975    Quit date: 07/07/2012  . Smokeless tobacco: Never Used     Comment: Quit 1 year ago  .  Alcohol Use: 1.8 oz/week    3 Glasses of wine per week  . Drug Use: No  . Sexual Activity: No   Other Topics Concern  . Not on file   Social History Narrative   She lives alone.      Review of Systems  General:  No chills, fever, night sweats or weight changes.  Cardiovascular:  No chest pain, dyspnea on exertion, edema, orthopnea, palpitations, paroxysmal nocturnal dyspnea. Dermatological: No rash, lesions/masses Respiratory: No cough, dyspnea Urologic: No hematuria, dysuria Abdominal:   No nausea, vomiting, diarrhea, bright red blood per rectum, melena, or hematemesis Neurologic:  No visual changes, wkns, changes in mental status. All  other systems reviewed and are otherwise negative except as noted above.  Physical Exam  Blood pressure 134/85, pulse 109, temperature 97.4 F (36.3 C), temperature source Oral, resp. rate 20, weight 155 lb (70.308 kg), SpO2 97 %.  General: Pleasant, NAD Psych: Normal affect. Neuro: Alert and oriented X 3. Moves all extremities spontaneously. HEENT: Normal  Neck: Supple without bruits or JVD. Lungs:  Resp regular and unlabored, CTA. Heart: RRR no s3, s4, or murmurs.distant heart sounds, no rub. Abdomen: Soft, non-tender, non-distended, BS + x 4.  Extremities: No clubbing, cyanosis or edema. DP/PT/Radials 2+ and equal bilaterally.  Labs None  Radiology/Studies  No results found.  Echocardiogram 01/27/2014 Left ventricle: Systolic function was normal. The estimated ejection fraction was in the range of 60% to 65%. Wall motion was normal; there were no regional wall motion abnormalities. - Pericardium, extracardiac: A trivial pericardial effusion was identified.  ECG: Sinus tachycardia, negative T waves in V2-5, low voltage in precordial leads   ASSESSMENT AND PLAN  58 year old female   1. Recurrent acute pericarditis - off ibuprofen for the last month, on colchicine 0.6 mg po BID. Continue colchicine, restart Ibuprofen 400 mg po TID. There is low voltage ECG, sinus tachycardia and distant heart sounds on clinical exam. BP 110-143 mmHg. The patient is under significant stress and requesting not to admitted as she needs to take her mother post surgery home.  We will arrange for a stat echocardiogram tonight to evaluate amount of pericardial effusion and if any tamponade physiology. If ok, she can be discharged on NSAIDS/colchicine combination with an early follow up in the clinic with Dr. Harl Bowie.  2. Hyperlipidemia - diet only 3. Hypothyroidism - on Synthroid 4. Anxiety - PRN Xanax  Signed, Dorothy Spark, MD, [facility] 03/01/2014, 3:41 PM

## 2014-03-01 NOTE — ED Notes (Signed)
Patient signed out AMA refusing any further treatment due to the fact that her mother is also in hospital and she 'needed to be with her.' Patient acknowledged that she had worsening pericardial effusion and stated that she would follow up with her cardiologist this week. Patient alert, oriented, ambulatory and well appearing upon leaving.

## 2014-03-01 NOTE — ED Notes (Signed)
ECHO tech at bedside.

## 2014-03-02 ENCOUNTER — Telehealth: Payer: Self-pay | Admitting: *Deleted

## 2014-03-02 MED ORDER — IBUPROFEN 400 MG PO TABS: 400.0000 mg | ORAL_TABLET | Freq: Two times a day (BID) | ORAL | Status: DC

## 2014-03-02 NOTE — Telephone Encounter (Signed)
Patient calling with c/o acute recurrent pericarditis.  Stated that she did go to ED at [facility], but did not feel she could stay due to her mother being admitted there.  Patient stated that she left without being treated because she did not feel she could wait any longer & needed to be with her mother.  Would like for Dr. Harl Bowie to advise her on what to do.

## 2014-03-02 NOTE — Telephone Encounter (Signed)
Read ER note by Dr Meda Coffee who saw her. Her recs were for ibuprofen 400mg  po tid and colchicine 0.6mg  bid. I agree with this, ibuprofen is over the counter. We can call in prescription for colchcine. Previously twice a day colchicine caused her diarrhea, I would start once a day and see how she tolerates it. She needs f/u with me or Curt Bears in 2 weeks.    Zandra Abts MD

## 2014-03-02 NOTE — Telephone Encounter (Signed)
Patient notified.  States she already has Colchicine at home from before.  Follow up scheduled for 03/24/14 with Dr. Harl Bowie.

## 2014-03-21 ENCOUNTER — Ambulatory Visit (INDEPENDENT_AMBULATORY_CARE_PROVIDER_SITE_OTHER): Payer: Medicare Other | Admitting: Cardiology

## 2014-03-21 ENCOUNTER — Encounter: Payer: Self-pay | Admitting: Cardiology

## 2014-03-21 VITALS — BP 124/80 | HR 103 | Ht 66.0 in | Wt 149.0 lb

## 2014-03-21 DIAGNOSIS — I309 Acute pericarditis, unspecified: Secondary | ICD-10-CM

## 2014-03-21 DIAGNOSIS — I319 Disease of pericardium, unspecified: Secondary | ICD-10-CM

## 2014-03-21 DIAGNOSIS — I313 Pericardial effusion (noninflammatory): Secondary | ICD-10-CM

## 2014-03-21 DIAGNOSIS — I3139 Other pericardial effusion (noninflammatory): Secondary | ICD-10-CM

## 2014-03-21 NOTE — Progress Notes (Signed)
Clinical Summary [name] is a 58 y.o.female seen today for follow up of the following medical problems.   1. Pericarditis - seen in [place] 12/2013 with chest pain, clinically suspicious for pericarditis. CT at the time showed a moderate pericardial effusion, cardiac enzymes negative. EKG non-specific ST/T changes. ESR 42, ANA negative. Transferred to [facility] for management.  - echo LVEF 55-60%, moderate effusion, no tamponade physiology - started on ibuprofen and colchicine for pericarditis with improved symptoms. Could not tolerate bid colchicine, changed to once daily  - symptoms initially resolved, repeat echo showed near resolution of effusion - towards end of Nov chest pain symptoms returned, seen in ER where echo showed recurrence of effusion. Restarted on ibufproen and colchicine at that time. - since restarting symptoms are improving but not resolved.    Past Medical History  Diagnosis Date  . Hypothyroid   . Hx of migraines     a. On propranolol.  . Dyslipidemia   . Depression   . Anxiety   . Acute pericarditis     a. Dx 12/2013.  Marland Kitchen Pericardial effusion     a. Dx 12/2013 - moderate without hemodynamic compromise.     Allergies  Allergen Reactions  . Other Swelling    Walnuts  . Sulfa Antibiotics Shortness Of Breath    Swelling of lips   . Sunscreens Anaphylaxis and Hives    Titanium and zinc oxides Eye swelling   . Seroquel [Quetiapine] Other (See Comments)    Pychosis (foggy, uncoordinated, slurred speech)  . Tape Hives     Current Outpatient Prescriptions  Medication Sig Dispense Refill  . ALPRAZolam (XANAX) 0.5 MG tablet Take 0.5 mg by mouth at bedtime as needed for anxiety.    Marland Kitchen buPROPion (WELLBUTRIN SR) 200 MG 12 hr tablet Take 200 mg by mouth daily.    . colchicine 0.6 MG tablet Take 1 tablet (0.6 mg total) by mouth daily. 30 tablet 6  . diphenhydrAMINE (BENADRYL) 50 MG capsule Take 50 mg by mouth every 6 (six) hours as needed for  allergies.    . fexofenadine (ALLEGRA) 180 MG tablet Take 180 mg by mouth daily.    . Homeopathic Products (LEG CRAMP COMPLEX PO) Take 1 capsule by mouth daily as needed.    Marland Kitchen HYDROcodone-acetaminophen (NORCO/VICODIN) 5-325 MG per tablet Take 2 tablets by mouth every 6 (six) hours as needed for moderate pain.    Marland Kitchen ibuprofen (ADVIL,MOTRIN) 400 MG tablet Take 1 tablet (400 mg total) by mouth 2 (two) times daily.    Marland Kitchen levothyroxine (SYNTHROID, LEVOTHROID) 75 MCG tablet Take 75 mcg by mouth daily before breakfast.    . pantoprazole (PROTONIX) 40 MG tablet Take 40 mg by mouth daily.    . propranolol ER (INDERAL LA) 60 MG 24 hr capsule Take 1 capsule (60 mg total) by mouth daily. 30 capsule 2  . venlafaxine XR (EFFEXOR-XR) 150 MG 24 hr capsule Take 300 mg by mouth daily with breakfast.     No current facility-administered medications for this visit.     Past Surgical History  Procedure Laterality Date  . Abdominal hysterectomy    . Breast surgery    . Cervical fusion    . Cholecystectomy    . Nasal sinus surgery       Allergies  Allergen Reactions  . Other Swelling    Walnuts  . Sulfa Antibiotics Shortness Of Breath    Swelling of lips   . Sunscreens Anaphylaxis and Hives  Titanium and zinc oxides Eye swelling   . Seroquel [Quetiapine] Other (See Comments)    Pychosis (foggy, uncoordinated, slurred speech)  . Tape Hives      Family History  Problem Relation Age of Onset  . Depression Mother   . Depression Father   . Depression Sister   . Anxiety disorder Sister   . Alcohol abuse Maternal Uncle   . Alcohol abuse Paternal Uncle   . Alcohol abuse Maternal Grandfather   . Depression Maternal Grandmother      Social History [name] reports that she quit smoking about 20 months ago. Her smoking use included Cigarettes. She started smoking about 38 years ago. She has a 30 pack-year smoking history. She has never used smokeless tobacco. [name] reports that she  drinks about 1.8 oz of alcohol per week.   Review of Systems CONSTITUTIONAL: No weight loss, fever, chills, weakness or fatigue.  HEENT: Eyes: No visual loss, blurred vision, double vision or yellow sclerae.No hearing loss, sneezing, congestion, runny nose or sore throat.  SKIN: No rash or itching.  CARDIOVASCULAR: per HPI RESPIRATORY: No shortness of breath, cough or sputum.  GASTROINTESTINAL: No anorexia, nausea, vomiting or diarrhea. No abdominal pain or blood.  GENITOURINARY: No burning on urination, no polyuria NEUROLOGICAL: No headache, dizziness, syncope, paralysis, ataxia, numbness or tingling in the extremities. No change in bowel or bladder control.  MUSCULOSKELETAL: No muscle, back pain, joint pain or stiffness.  LYMPHATICS: No enlarged nodes. No history of splenectomy.  PSYCHIATRIC: No history of depression or anxiety.  ENDOCRINOLOGIC: No reports of sweating, cold or heat intolerance. No polyuria or polydipsia.  Marland Kitchen   Physical Examination p 103 bp 124/80 Wt 149 lbs BMI 24 Gen: resting comfortably, no acute distress HEENT: no scleral icterus, pupils equal round and reactive, no palptable cervical adenopathy,  CV: RRR, no m/r/g, no JVD, no carotid bruits Resp: Clear to auscultation bilaterally GI: abdomen is soft, non-tender, non-distended, normal bowel sounds, no hepatosplenomegaly MSK: extremities are warm, no edema.  Skin: warm, no rash Neuro:  no focal deficits Psych: appropriate affect   Diagnostic Studies 01/02/14 Echo Study Conclusions  - Left ventricle: The cavity size was normal. Systolic function was normal. The estimated ejection fraction was in the range of 55% to 60%. Wall motion was normal; there were no regional wall motion abnormalities. - Pericardium, extracardiac: A moderate pericardial effusion was identified. There was no echocardiographic evidence for tamponade physiology.  02/2014 Echo Study Conclusions  - Left ventricle: The cavity size was  normal. Systolic function was normal. The estimated ejection fraction was in the range of 60% to 65%. Wall motion was normal; there were no regional wall motion abnormalities. Doppler parameters are consistent with abnormal left ventricular relaxation (grade 1 diastolic dysfunction). - Pericardium, extracardiac: A moderate pericardial effusion was identified circumferential to the heart. The fluid exhibited a fibrinous appearance.There was no evidence of hemodynamic compromise. There was no chamber collapse. Respirophasic change in stroke volume was normal. Features were not consistent with tamponade physiology.  Impressions:  - Compared to the prior study, there has been a slight increase in pericardial effusion along the posterior wall.  Assessment and Plan  1. Recurrent Pericarditis - restarted on NSAIDs and colchicine, symptoms improving. She was not able to tolerate bid dosing of colchicine previosuly due to diarrhea, suspect that increased her risk of recurrence. Continue NSAIDs x 1 month. I have asked her to try bid colchicine every other day to see how tolerated.  - repeat  limited echo in 2 weeks to follow effusion      F/u 3 months   Arnoldo Lenis, M.D

## 2014-03-21 NOTE — Patient Instructions (Signed)
Your physician recommends that you schedule a follow-up appointment in: 3 months. Your physician recommends that you continue on your current medications as directed. Please refer to the Current Medication list given to you today. Your physician has requested that you have a limited echocardiogram. Echocardiography is a painless test that uses sound waves to create images of your heart. It provides your doctor with information about the size and shape of your heart and how well your heart's chambers and valves are working. This procedure takes approximately one hour. There are no restrictions for this procedure.

## 2014-03-24 ENCOUNTER — Ambulatory Visit: Payer: Self-pay | Admitting: Cardiology

## 2014-04-06 ENCOUNTER — Other Ambulatory Visit (INDEPENDENT_AMBULATORY_CARE_PROVIDER_SITE_OTHER): Payer: Medicare Other

## 2014-04-06 ENCOUNTER — Other Ambulatory Visit: Payer: Self-pay | Admitting: Physician Assistant

## 2014-04-06 ENCOUNTER — Other Ambulatory Visit: Payer: Self-pay

## 2014-04-06 DIAGNOSIS — I309 Acute pericarditis, unspecified: Secondary | ICD-10-CM

## 2014-04-06 DIAGNOSIS — I3139 Other pericardial effusion (noninflammatory): Secondary | ICD-10-CM

## 2014-04-06 DIAGNOSIS — I319 Disease of pericardium, unspecified: Secondary | ICD-10-CM

## 2014-04-06 DIAGNOSIS — I313 Pericardial effusion (noninflammatory): Secondary | ICD-10-CM

## 2014-04-13 ENCOUNTER — Telehealth: Payer: Self-pay | Admitting: *Deleted

## 2014-04-13 NOTE — Telephone Encounter (Signed)
Notes Recorded by Laurine Blazer, LPN on 0/11/8108 at 31:59 AM Patient notified and verbalized understanding. Already has 3 month follow up scheduled for March with Dr. Harl Bowie.

## 2014-04-13 NOTE — Telephone Encounter (Signed)
-----   Message from Arnoldo Lenis, MD sent at 04/11/2014  4:14 PM EST ----- Echo looks good, there is no significant fluid around the heart. Hopefully symptoms will improve with medications   Zandra Abts MD

## 2014-06-01 ENCOUNTER — Encounter (INDEPENDENT_AMBULATORY_CARE_PROVIDER_SITE_OTHER): Payer: Medicare Other | Admitting: Ophthalmology

## 2014-06-01 DIAGNOSIS — H338 Other retinal detachments: Secondary | ICD-10-CM

## 2014-06-01 DIAGNOSIS — Z79899 Other long term (current) drug therapy: Secondary | ICD-10-CM | POA: Diagnosis not present

## 2014-06-01 DIAGNOSIS — H43813 Vitreous degeneration, bilateral: Secondary | ICD-10-CM

## 2014-06-16 ENCOUNTER — Ambulatory Visit (INDEPENDENT_AMBULATORY_CARE_PROVIDER_SITE_OTHER): Payer: Medicare Other | Admitting: Cardiology

## 2014-06-16 ENCOUNTER — Encounter: Payer: Self-pay | Admitting: Cardiology

## 2014-06-16 VITALS — BP 104/69 | HR 93 | Ht 66.0 in | Wt 160.0 lb

## 2014-06-16 DIAGNOSIS — I319 Disease of pericardium, unspecified: Secondary | ICD-10-CM | POA: Diagnosis not present

## 2014-06-16 NOTE — Progress Notes (Signed)
Clinical Summary [name] is a 59 y.o.female seen today for follow up of the following medical problems.     1. Pericarditis - seen in [place] 12/2013 with chest pain, clinically suspicious for pericarditis. CT at the time showed a moderate pericardial effusion, cardiac enzymes negative. EKG non-specific ST/T changes. ESR 42, ANA negative. Transferred to [facility] for management.  - echo LVEF 55-60%, moderate effusion, no tamponade physiology at that time - started on ibuprofen and colchicine for pericarditis with improved symptoms and eventual resolution of symptoms  - suffered recurrence of pericarditis, restarted on ibuprofen and colchicine. Completed 2 weeks of NSAIDs, 3 months of colchicine. Symptoms now resolved since last visit, remains on colchicine only. Unable to tolerate bid colchicine due to GI symptoms, taking just once daily.   Past Medical History  Diagnosis Date  . Hypothyroid   . Hx of migraines     a. On propranolol.  . Dyslipidemia   . Depression   . Anxiety   . Acute pericarditis     a. Dx 12/2013.  Marland Kitchen Pericardial effusion     a. Dx 12/2013 - moderate without hemodynamic compromise.     Allergies  Allergen Reactions  . Other Swelling    Walnuts  . Sulfa Antibiotics Shortness Of Breath    Swelling of lips   . Sunscreens Anaphylaxis and Hives    Titanium and zinc oxides Eye swelling   . Seroquel [Quetiapine] Other (See Comments)    Pychosis (foggy, uncoordinated, slurred speech)  . Tape Hives     Current Outpatient Prescriptions  Medication Sig Dispense Refill  . ALPRAZolam (XANAX) 0.5 MG tablet Take 0.5 mg by mouth at bedtime as needed for anxiety.    Marland Kitchen buPROPion (WELLBUTRIN SR) 200 MG 12 hr tablet Take 200 mg by mouth daily.    . colchicine 0.6 MG tablet Take 1 tablet (0.6 mg total) by mouth daily. 30 tablet 6  . diphenhydrAMINE (BENADRYL) 50 MG capsule Take 50 mg by mouth every 6 (six) hours as needed for allergies.    Marland Kitchen  HYDROcodone-acetaminophen (NORCO/VICODIN) 5-325 MG per tablet Take 2 tablets by mouth every 6 (six) hours as needed for moderate pain.    Marland Kitchen ibuprofen (ADVIL,MOTRIN) 800 MG tablet Take 800 mg by mouth 2 (two) times daily.    Marland Kitchen levothyroxine (SYNTHROID, LEVOTHROID) 75 MCG tablet Take 75 mcg by mouth daily before breakfast.    . pantoprazole (PROTONIX) 40 MG tablet Take 40 mg by mouth daily.    . propranolol ER (INDERAL LA) 60 MG 24 hr capsule TAKE ONE CAPSULE BY MOUTH DAILY 30 capsule 3  . ROPINIRole HCl (REQUIP PO) Take 1 tablet by mouth at bedtime.    Marland Kitchen venlafaxine XR (EFFEXOR-XR) 150 MG 24 hr capsule Take 300 mg by mouth daily with breakfast.     No current facility-administered medications for this visit.     Past Surgical History  Procedure Laterality Date  . Abdominal hysterectomy    . Breast surgery    . Cervical fusion    . Cholecystectomy    . Nasal sinus surgery       Allergies  Allergen Reactions  . Other Swelling    Walnuts  . Sulfa Antibiotics Shortness Of Breath    Swelling of lips   . Sunscreens Anaphylaxis and Hives    Titanium and zinc oxides Eye swelling   . Seroquel [Quetiapine] Other (See Comments)    Pychosis (foggy, uncoordinated, slurred speech)  . Tape  Hives      Family History  Problem Relation Age of Onset  . Depression Mother   . Depression Father   . Depression Sister   . Anxiety disorder Sister   . Alcohol abuse Maternal Uncle   . Alcohol abuse Paternal Uncle   . Alcohol abuse Maternal Grandfather   . Depression Maternal Grandmother      Social History [name] reports that she quit smoking about 23 months ago. Her smoking use included Cigarettes. She started smoking about 39 years ago. She has a 30 pack-year smoking history. She has never used smokeless tobacco. [name] reports that she drinks about 1.8 oz of alcohol per week.   Review of Systems CONSTITUTIONAL: No weight loss, fever, chills, weakness or fatigue.  HEENT:  Eyes: No visual loss, blurred vision, double vision or yellow sclerae.No hearing loss, sneezing, congestion, runny nose or sore throat.  SKIN: No rash or itching.  CARDIOVASCULAR: per HPI  RESPIRATORY: No shortness of breath, cough or sputum.  GASTROINTESTINAL: No anorexia, nausea, vomiting or diarrhea. No abdominal pain or blood.  GENITOURINARY: No burning on urination, no polyuria NEUROLOGICAL: No headache, dizziness, syncope, paralysis, ataxia, numbness or tingling in the extremities. No change in bowel or bladder control.  MUSCULOSKELETAL: No muscle, back pain, joint pain or stiffness.  LYMPHATICS: No enlarged nodes. No history of splenectomy.  PSYCHIATRIC: No history of depression or anxiety.  ENDOCRINOLOGIC: No reports of sweating, cold or heat intolerance. No polyuria or polydipsia.  Marland Kitchen   Physical Examination p 93 bp 104/69 Wt 160 lbs BMI 26 Gen: resting comfortably, no acute distress HEENT: no scleral icterus, pupils equal round and reactive, no palptable cervical adenopathy,  CV: RRR, no m/r/g, no JVD, no carotid bruits Resp: Clear to auscultation bilaterally GI: abdomen is soft, non-tender, non-distended, normal bowel sounds, no hepatosplenomegaly MSK: extremities are warm, no edema.  Skin: warm, no rash Neuro:  no focal deficits Psych: appropriate affect   Diagnostic Studies 01/02/14 Echo Study Conclusions  - Left ventricle: The cavity size was normal. Systolic function was normal. The estimated ejection fraction was in the range of 55% to 60%. Wall motion was normal; there were no regional wall motion abnormalities. - Pericardium, extracardiac: A moderate pericardial effusion was identified. There was no echocardiographic evidence for tamponade physiology.  02/2014 Echo Study Conclusions  - Left ventricle: The cavity size was normal. Systolic function was normal. The estimated ejection fraction was in the range of 60% to 65%. Wall motion was normal; there  were no regional wall motion abnormalities. Doppler parameters are consistent with abnormal left ventricular relaxation (grade 1 diastolic dysfunction). - Pericardium, extracardiac: A moderate pericardial effusion was identified circumferential to the heart. The fluid exhibited a fibrinous appearance.There was no evidence of hemodynamic compromise. There was no chamber collapse. Respirophasic change in stroke volume was normal. Features were not consistent with tamponade physiology.  Impressions:  - Compared to the prior study, there has been a slight increase in pericardial effusion along the posterior wall.  03/2014 Echo Study Conclusions  - Procedure narrative: Transthoracic echocardiography. Image quality was fair, with suboptimal valvular visualization. - Left ventricle: The cavity size appeared normal. Intracavitary diameter and wall thickness were not obtained. Systolic function was vigorous. The estimated ejection fraction was in the range of 65% to 70%. Wall motion was normal; there were no regional wall motion abnormalities. - Pericardium, extracardiac: Not well visualized. There appeared to be a trivial to small circumferential pericardial effusion. There was no evidence of  hemodynamic compromise, with no right atrial or ventricular diastolic collapse. There was normal respiratory variation of the IVC. There was suboptimal valvular visualization for the assessment of inflow variation.  Assessment and Plan   1. Recurrent Pericarditis - symptoms now resolved, completed 2 weeks of NSAIDs and 3 months of colchicine. Will d/c colchicine at this time.   F/u as needed.         Arnoldo Lenis, M.D.

## 2014-06-16 NOTE — Patient Instructions (Signed)
Your physician recommends that you schedule a follow-up appointment as needed with Dr. Harl Bowie  Your physician has recommended you make the following change in your medication:   STOP [facility] LIST  Thank you for choosing [facility]!!

## 2015-05-01 DIAGNOSIS — J42 Unspecified chronic bronchitis: Secondary | ICD-10-CM | POA: Diagnosis not present

## 2015-05-24 DIAGNOSIS — H43393 Other vitreous opacities, bilateral: Secondary | ICD-10-CM | POA: Diagnosis not present

## 2015-05-24 DIAGNOSIS — H40033 Anatomical narrow angle, bilateral: Secondary | ICD-10-CM | POA: Diagnosis not present

## 2015-06-04 DIAGNOSIS — F419 Anxiety disorder, unspecified: Secondary | ICD-10-CM | POA: Diagnosis not present

## 2015-06-04 DIAGNOSIS — W57XXXA Bitten or stung by nonvenomous insect and other nonvenomous arthropods, initial encounter: Secondary | ICD-10-CM | POA: Diagnosis not present

## 2015-06-04 DIAGNOSIS — S80861A Insect bite (nonvenomous), right lower leg, initial encounter: Secondary | ICD-10-CM | POA: Diagnosis not present

## 2015-06-04 DIAGNOSIS — G4730 Sleep apnea, unspecified: Secondary | ICD-10-CM | POA: Diagnosis not present

## 2015-06-04 DIAGNOSIS — Z79899 Other long term (current) drug therapy: Secondary | ICD-10-CM | POA: Diagnosis not present

## 2015-06-04 DIAGNOSIS — F329 Major depressive disorder, single episode, unspecified: Secondary | ICD-10-CM | POA: Diagnosis not present

## 2015-06-04 DIAGNOSIS — R030 Elevated blood-pressure reading, without diagnosis of hypertension: Secondary | ICD-10-CM | POA: Diagnosis not present

## 2015-06-04 DIAGNOSIS — Z981 Arthrodesis status: Secondary | ICD-10-CM | POA: Diagnosis not present

## 2015-06-06 DIAGNOSIS — W57XXXA Bitten or stung by nonvenomous insect and other nonvenomous arthropods, initial encounter: Secondary | ICD-10-CM | POA: Diagnosis not present

## 2015-06-06 DIAGNOSIS — R21 Rash and other nonspecific skin eruption: Secondary | ICD-10-CM | POA: Diagnosis not present

## 2015-06-06 DIAGNOSIS — Z418 Encounter for other procedures for purposes other than remedying health state: Secondary | ICD-10-CM | POA: Diagnosis not present

## 2015-06-13 DIAGNOSIS — R110 Nausea: Secondary | ICD-10-CM | POA: Diagnosis not present

## 2015-06-13 DIAGNOSIS — Z789 Other specified health status: Secondary | ICD-10-CM | POA: Diagnosis not present

## 2015-06-13 DIAGNOSIS — Z299 Encounter for prophylactic measures, unspecified: Secondary | ICD-10-CM | POA: Diagnosis not present

## 2015-06-13 DIAGNOSIS — R197 Diarrhea, unspecified: Secondary | ICD-10-CM | POA: Diagnosis not present

## 2015-06-22 ENCOUNTER — Encounter (INDEPENDENT_AMBULATORY_CARE_PROVIDER_SITE_OTHER): Payer: PPO | Admitting: Ophthalmology

## 2015-06-22 DIAGNOSIS — H2513 Age-related nuclear cataract, bilateral: Secondary | ICD-10-CM | POA: Diagnosis not present

## 2015-06-22 DIAGNOSIS — H35361 Drusen (degenerative) of macula, right eye: Secondary | ICD-10-CM | POA: Diagnosis not present

## 2015-06-22 DIAGNOSIS — H43813 Vitreous degeneration, bilateral: Secondary | ICD-10-CM

## 2015-06-22 DIAGNOSIS — H35422 Microcystoid degeneration of retina, left eye: Secondary | ICD-10-CM

## 2015-06-27 DIAGNOSIS — I10 Essential (primary) hypertension: Secondary | ICD-10-CM | POA: Diagnosis not present

## 2015-06-27 DIAGNOSIS — F419 Anxiety disorder, unspecified: Secondary | ICD-10-CM | POA: Diagnosis not present

## 2015-07-18 DIAGNOSIS — Z299 Encounter for prophylactic measures, unspecified: Secondary | ICD-10-CM | POA: Diagnosis not present

## 2015-07-18 DIAGNOSIS — R5383 Other fatigue: Secondary | ICD-10-CM | POA: Diagnosis not present

## 2015-07-18 DIAGNOSIS — R197 Diarrhea, unspecified: Secondary | ICD-10-CM | POA: Diagnosis not present

## 2015-07-19 DIAGNOSIS — R5383 Other fatigue: Secondary | ICD-10-CM | POA: Diagnosis not present

## 2015-07-19 DIAGNOSIS — R197 Diarrhea, unspecified: Secondary | ICD-10-CM | POA: Diagnosis not present

## 2015-07-25 DIAGNOSIS — R197 Diarrhea, unspecified: Secondary | ICD-10-CM | POA: Diagnosis not present

## 2015-07-28 DIAGNOSIS — K529 Noninfective gastroenteritis and colitis, unspecified: Secondary | ICD-10-CM | POA: Diagnosis not present

## 2015-08-08 DIAGNOSIS — Z6824 Body mass index (BMI) 24.0-24.9, adult: Secondary | ICD-10-CM | POA: Diagnosis not present

## 2015-08-08 DIAGNOSIS — Z0000 Encounter for general adult medical examination without abnormal findings: Secondary | ICD-10-CM | POA: Diagnosis not present

## 2015-08-08 DIAGNOSIS — Z1389 Encounter for screening for other disorder: Secondary | ICD-10-CM | POA: Diagnosis not present

## 2015-08-08 DIAGNOSIS — Z7189 Other specified counseling: Secondary | ICD-10-CM | POA: Diagnosis not present

## 2015-08-08 DIAGNOSIS — Z299 Encounter for prophylactic measures, unspecified: Secondary | ICD-10-CM | POA: Diagnosis not present

## 2015-08-15 DIAGNOSIS — M818 Other osteoporosis without current pathological fracture: Secondary | ICD-10-CM | POA: Diagnosis not present

## 2015-09-07 DIAGNOSIS — G4733 Obstructive sleep apnea (adult) (pediatric): Secondary | ICD-10-CM | POA: Diagnosis not present

## 2015-09-15 DIAGNOSIS — Z87891 Personal history of nicotine dependence: Secondary | ICD-10-CM | POA: Diagnosis not present

## 2015-09-15 DIAGNOSIS — J329 Chronic sinusitis, unspecified: Secondary | ICD-10-CM | POA: Diagnosis not present

## 2015-11-13 ENCOUNTER — Ambulatory Visit (INDEPENDENT_AMBULATORY_CARE_PROVIDER_SITE_OTHER): Payer: PPO | Admitting: Obstetrics and Gynecology

## 2015-11-13 ENCOUNTER — Encounter: Payer: Self-pay | Admitting: Obstetrics and Gynecology

## 2015-11-13 VITALS — BP 128/82 | HR 82 | Ht 66.0 in | Wt 152.0 lb

## 2015-11-13 DIAGNOSIS — N930 Postcoital and contact bleeding: Secondary | ICD-10-CM | POA: Diagnosis not present

## 2015-11-13 NOTE — Progress Notes (Signed)
Patient ID: Teresa Galloway, female   DOB: November 01, 1955, 60 y.o.   MRN: [id number]   [facility] Visit  @DATE @            Patient name: Teresa Galloway MRN [id number]  Date of birth: 1955/12/23  CC & HPI:  SHERELL WHITLEDGE is a 60 y.o. female presenting today for post-coital bleeding and dyspareunia for the last month. She reports that she experiences bleeding for 2-3 days after intercourse. Pt reports that she noticed blood on her partner and the sheets after intercourse, and in the days following intercourse on toilet tissue. Pt states she experiences soreness at her vaginal entrance after intercourse. Pt states this a relatively new relationship for her.  She also reports her partner has larger than average genitals. Pt states she and her partner use lubricants and she denies issues with vaginal dryness.   ROS:  Review of Systems  Genitourinary:       +dyspareunia, vaginal bleeding   Pertinent History Reviewed:   Reviewed: Significant for vaginal hysterectomy  Medical         Past Medical History:  Diagnosis Date   Acute pericarditis    a. Dx 12/2013.   Anxiety    Depression    Dyslipidemia    Hx of migraines    a. On propranolol.   Hypothyroid    Pericardial effusion    a. Dx 12/2013 - moderate without hemodynamic compromise.                              Surgical Hx:    Past Surgical History:  Procedure Laterality Date   ABDOMINAL HYSTERECTOMY     BREAST SURGERY     CERVICAL FUSION     CHOLECYSTECTOMY     NASAL SINUS SURGERY     Medications: Reviewed & Updated - see associated section                       Current Outpatient Prescriptions:    ALPRAZolam (XANAX) 0.5 MG tablet, Take 1 mg by mouth at bedtime as needed for anxiety. , Disp: , Rfl:    ARIPiprazole (ABILIFY) 5 MG tablet, Take 5 mg by mouth daily., Disp: , Rfl:    citalopram (CELEXA) 10 MG tablet, Take 10 mg by mouth at bedtime., Disp: , Rfl:    diphenhydrAMINE (BENADRYL) 50 MG capsule, Take 50  mg by mouth every 6 (six) hours as needed for allergies., Disp: , Rfl:    ergocalciferol (VITAMIN D2) 50000 UNITS capsule, Take 50,000 Units by mouth once a week., Disp: , Rfl:    lamoTRIgine (LAMICTAL) 100 MG tablet, Take 100 mg by mouth 2 (two) times daily., Disp: , Rfl:    levothyroxine (SYNTHROID, LEVOTHROID) 75 MCG tablet, Take 75 mcg by mouth daily before breakfast., Disp: , Rfl:    pantoprazole (PROTONIX) 40 MG tablet, Take 40 mg by mouth daily., Disp: , Rfl:    propranolol ER (INDERAL LA) 60 MG 24 hr capsule, TAKE ONE CAPSULE BY MOUTH DAILY, Disp: 30 capsule, Rfl: 3   rizatriptan (MAXALT) 10 MG tablet, Take 10 mg by mouth as needed for migraine. May repeat in 2 hours if needed, Disp: , Rfl:    HYDROcodone-acetaminophen (NORCO/VICODIN) 5-325 MG per tablet, Take 2 tablets by mouth every 6 (six) hours as needed for moderate pain., Disp: , Rfl:    ROPINIRole HCl (REQUIP PO), Take 1  tablet by mouth at bedtime., Disp: , Rfl:    triamcinolone (NASACORT) 55 MCG/ACT AERO nasal inhaler, Place 2 sprays into the nose daily as needed., Disp: , Rfl:    venlafaxine XR (EFFEXOR-XR) 150 MG 24 hr capsule, Take 300 mg by mouth daily with breakfast., Disp: , Rfl:    Social History: Reviewed -  reports that she quit smoking about 3 years ago. Her smoking use included Cigarettes. She started smoking about 40 years ago. She has a 30.00 pack-year smoking history. She has never used smokeless tobacco.  Objective Findings:  Vitals: Blood pressure 128/82, pulse 82, height 5\' 6"  (1.676 m), weight 152 lb (68.9 kg).  Physical Examination: General appearance - alert, well appearing, and in no distress Mental status - alert, oriented to person, place, and time Abdomen - soft, nontender, nondistended, no masses or organomegaly Pelvic -  VULVA: normal appearing vulva with no masses,  VAGINA: normal appearing vagina with normal color, no lesions, good support, moderate amount of discharge present in the  vaginal vault, probable tear in posterior entrance, totally healed  CERVIX: surgically absent,  UTERUS: surgically absent, vaginal cuff well healed,  ADNEXA: normal adnexa in size, nontender and no masses Musculoskeletal - no joint tenderness, deformity or swelling Extremities - peripheral pulses normal, no pedal edema, no clubbing or cyanosis Skin - normal coloration and turgor, no rashes, no suspicious skin lesions noted  Discussed with pt strategies to continue to have intercourse comfortably. At end of discussion, pt had opportunity to ask questions and has no further questions at this time.   Greater than 50% was spent in counseling and coordination of care with the patient. Total time greater than: 15 minutes   Assessment & Plan:   A:  1. Dyspareunia, post-coital vaginal bleeding x 1 month 2. Wet prep and KOH collected + profuse white cells and clue cells BV 3. GC/CHL collected  P:  1. Will Rx 7 days bid Metronidazole  2. Will Rx Estrace, to be used daily for 2 weeks and q2-3d after that  3. Pt advised to self examine vaginal length and have partner do the same   4. Advised pt to find comfortable positions during intercourse  5. F/u in 6 weeks for recheck    By signing my name below, I, Hansel Feinstein, attest that this documentation has been prepared under the direction and in the presence of Jonnie Kind, MD. Electronically Signed: Hansel Feinstein, ED Scribe. 11/13/15. 2:28 PM.  I personally performed the services described in this documentation, which was SCRIBED in my presence. The recorded information has been reviewed and considered accurate. It has been edited as necessary during review. Jonnie Kind, MD

## 2015-11-14 ENCOUNTER — Telehealth: Payer: Self-pay | Admitting: Obstetrics and Gynecology

## 2015-11-14 DIAGNOSIS — N952 Postmenopausal atrophic vaginitis: Secondary | ICD-10-CM

## 2015-11-14 DIAGNOSIS — B9689 Other specified bacterial agents as the cause of diseases classified elsewhere: Secondary | ICD-10-CM

## 2015-11-14 DIAGNOSIS — N760 Acute vaginitis: Secondary | ICD-10-CM

## 2015-11-14 MED ORDER — ESTRADIOL 0.1 MG/GM VA CREA: 0.2500 | TOPICAL_CREAM | VAGINAL | 3 refills | Status: DC

## 2015-11-14 MED ORDER — METRONIDAZOLE 500 MG PO TABS: 500.0000 mg | ORAL_TABLET | Freq: Two times a day (BID) | ORAL | 0 refills | Status: DC

## 2015-11-14 NOTE — Telephone Encounter (Signed)
Pt called stating that Dr. Glo Herring was suppose to send a medication to her pharmacy yesterday and that it will be ready by 5pm. Pt checked her pharmacy and the medication is still not there. Pt states that she uses [facility] in [place]. Please contact pt

## 2015-11-14 NOTE — Telephone Encounter (Signed)
Pt informed of Rx escribed to pharmacy

## 2015-11-15 LAB — GC/CHLAMYDIA PROBE AMP
Chlamydia trachomatis, NAA: NEGATIVE
NEISSERIA GONORRHOEAE BY PCR: NEGATIVE

## 2015-11-20 ENCOUNTER — Telehealth: Payer: Self-pay | Admitting: Obstetrics and Gynecology

## 2015-11-20 NOTE — Telephone Encounter (Signed)
Pt states that Friday she started having pain in her hips, called the after hours nurse and was told to stop the medication and see Dr. Glo Herring as soon as she could. Pt states that once she stopped the medication the pain stopped.   Pt wants to know if she should start the medication again and see if that is really what caused the hip pain.

## 2015-11-21 ENCOUNTER — Telehealth: Payer: Self-pay | Admitting: *Deleted

## 2015-11-21 NOTE — Telephone Encounter (Signed)
Duplicate message. 

## 2015-11-28 NOTE — Telephone Encounter (Signed)
Since note does not describe the specific med that pt is concerned about, I could only leave message for pt to make appt (preferably) or leave name of med.

## 2015-11-29 DIAGNOSIS — M25511 Pain in right shoulder: Secondary | ICD-10-CM | POA: Diagnosis not present

## 2015-11-29 DIAGNOSIS — K219 Gastro-esophageal reflux disease without esophagitis: Secondary | ICD-10-CM | POA: Diagnosis not present

## 2015-11-29 DIAGNOSIS — J42 Unspecified chronic bronchitis: Secondary | ICD-10-CM | POA: Diagnosis not present

## 2015-11-29 DIAGNOSIS — E039 Hypothyroidism, unspecified: Secondary | ICD-10-CM | POA: Diagnosis not present

## 2015-12-06 ENCOUNTER — Telehealth: Payer: Self-pay | Admitting: Obstetrics and Gynecology

## 2015-12-06 NOTE — Telephone Encounter (Signed)
Pt states she has been using the Estrace cream daily since prescribed. Pt requesting a new Rx because she is almost out.   Pt informed per Dr. Glo Herring note from 11/13/2015 visit Estrace daily x 2 weeks than q 2-3 days. Pt verbalized understanding and states will still need new Rx.

## 2015-12-06 NOTE — Telephone Encounter (Signed)
I called the pharmacy and confirmed that 3 refils exist on Rx in their possession. Pharmacy to refil Rx and notify pt

## 2015-12-07 NOTE — Telephone Encounter (Signed)
Pt informed per Dr. Glo Herring Rx at Pharmacy. Pt verbalized understanding.

## 2015-12-19 ENCOUNTER — Encounter: Payer: Self-pay | Admitting: Obstetrics and Gynecology

## 2015-12-19 ENCOUNTER — Ambulatory Visit (INDEPENDENT_AMBULATORY_CARE_PROVIDER_SITE_OTHER): Payer: PPO | Admitting: Obstetrics and Gynecology

## 2015-12-19 DIAGNOSIS — N952 Postmenopausal atrophic vaginitis: Secondary | ICD-10-CM

## 2015-12-19 DIAGNOSIS — N9419 Other specified dyspareunia: Secondary | ICD-10-CM | POA: Diagnosis not present

## 2015-12-19 MED ORDER — ESTRADIOL 1 MG PO TABS
1.0000 mg | ORAL_TABLET | Freq: Every day | ORAL | 11 refills | Status: DC
Start: 2015-12-19 — End: 2018-01-16

## 2015-12-19 NOTE — Progress Notes (Signed)
[facility] Visit  12/19/15           Patient name: Teresa Galloway MRN [id number]  Date of birth: 05/25/1955  CC & HPI:  Teresa Galloway is a 60 y.o. female presenting today for follow up from her visit on 11/13/15 due to dyspareunia and post-coital bleeding. Pt notes that she hasn't been sexually active since her last visit due to her partner being out of the country. Pt denies checking her vaginal length since her last visit. Pt has associated symptoms of night sweats and hot flashes. Pt has tried Rx estrace cream and metronidazole for the relief of her symptoms. Pt denies dyspareunia due to lack of intimacy, vaginal bleeding, and any other symptoms. Pt also reports that she would like to switch from estrace cream to estrace pill. Pt denies hx of heart disease, blood clots, or vascular disease.   ROS:  Review of Systems  Genitourinary:       -dyspareunia, vaginal bleeding     Pertinent History Reviewed:   Reviewed: Significant for abdominal hysterectomy Medical         Past Medical History:  Diagnosis Date  . Acute pericarditis    a. Dx 12/2013.  Marland Kitchen Anxiety   . Depression   . Dyslipidemia   . Hx of migraines    a. On propranolol.  . Hypothyroid   . Pericardial effusion    a. Dx 12/2013 - moderate without hemodynamic compromise.                              Surgical Hx:    Past Surgical History:  Procedure Laterality Date  . BREAST SURGERY    . CERVICAL FUSION    . CHOLECYSTECTOMY    . NASAL SINUS SURGERY    . VAGINAL HYSTERECTOMY     Medications: Reviewed & Updated - see associated section                       Current Outpatient Prescriptions:  .  ALPRAZolam (XANAX) 0.5 MG tablet, Take 1 mg by mouth at bedtime as needed for anxiety. , Disp: , Rfl:  .  ARIPiprazole (ABILIFY) 5 MG tablet, Take 5 mg by mouth daily., Disp: , Rfl:  .  citalopram (CELEXA) 10 MG tablet, Take 10 mg by mouth at bedtime., Disp: , Rfl:  .  diphenhydrAMINE (BENADRYL) 50 MG capsule, Take 50  mg by mouth every 6 (six) hours as needed for allergies., Disp: , Rfl:  .  EPIPEN 2-PAK 0.3 MG/0.3ML SOAJ injection, INJECT AS NEEDED., Disp: , Rfl: 1 .  ergocalciferol (VITAMIN D2) 50000 UNITS capsule, Take 50,000 Units by mouth once a week., Disp: , Rfl:  .  estradiol (ESTRACE VAGINAL) 0.1 MG/GM vaginal cream, Place [id number] Applicatorfuls vaginally 3 (three) times a week. This 0.5 g/ application, Disp: [id number] g, Rfl: 3 .  lamoTRIgine (LAMICTAL) 100 MG tablet, Take 100 mg by mouth 2 (two) times daily., Disp: , Rfl:  .  levothyroxine (SYNTHROID, LEVOTHROID) 75 MCG tablet, Take 75 mcg by mouth daily before breakfast., Disp: , Rfl:  .  pantoprazole (PROTONIX) 40 MG tablet, Take 40 mg by mouth daily., Disp: , Rfl:  .  propranolol ER (INDERAL LA) 60 MG 24 hr capsule, TAKE ONE CAPSULE BY MOUTH DAILY, Disp: 30 capsule, Rfl: 3 .  rizatriptan (MAXALT) 10 MG tablet, Take 10 mg by mouth as needed for migraine.  May repeat in 2 hours if needed, Disp: , Rfl:  .  ROPINIRole HCl (REQUIP PO), Take 1 tablet by mouth at bedtime., Disp: , Rfl:  .  venlafaxine XR (EFFEXOR-XR) 150 MG 24 hr capsule, Take 300 mg by mouth daily with breakfast., Disp: , Rfl:  .  metroNIDAZOLE (FLAGYL) 500 MG tablet, Take 1 tablet (500 mg total) by mouth 2 (two) times daily. (Patient not taking: Reported on 12/19/2015), Disp: 14 tablet, Rfl: 0   Social History: Reviewed -  reports that she quit smoking about 3 years ago. Her smoking use included Cigarettes. She started smoking about 40 years ago. She has a 30.00 pack-year smoking history. She has never used smokeless tobacco.  Objective Findings:  Vitals: Blood pressure 122/60, pulse 68, height 5\' 6"  (1.676 m), weight 152 lb 9.6 oz (69.2 kg).  Physical Examination:  General appearance - alert, well appearing, and in no distress and oriented to person, place, and time Mental status - alert, oriented to person, place, and time, normal mood, behavior, speech, dress, motor activity, and thought  processes Abdomen - soft, nontender, nondistended, no masses or organomegaly Pelvic - normal external genitalia, vulva, vagina, cervix, uterus and adnexa,  VULVA: normal appearing vulva with no masses, tenderness or lesions,  VAGINA: normal appearing vagina with normal color and discharge, no lesions, good support,  CERVIX: surgically absent,  UTERUS: surgically absent, vaginal cuff well healed,  ADNEXA: normal adnexa in size, nontender and no masses   Assessment & Plan:   A:  1. Resolved atrophic vaginitis  2. Resolved post-coital vaginal bleeding  P:  1. D/c estrace cream to oral estrace  2. Follow up PRN or 1 year    By signing my name below, I, Soijett Blue, attest that this documentation has been prepared under the direction and in the presence of Jonnie Kind, MD. Electronically Signed: [place], ED Scribe. 12/19/15. 2:01 PM.   I personally performed the services described in this documentation, which was SCRIBED in my presence. The recorded information has been reviewed and considered accurate. It has been edited as necessary during review. Jonnie Kind, MD

## 2016-01-01 DIAGNOSIS — Z6824 Body mass index (BMI) 24.0-24.9, adult: Secondary | ICD-10-CM | POA: Diagnosis not present

## 2016-01-01 DIAGNOSIS — Z90710 Acquired absence of both cervix and uterus: Secondary | ICD-10-CM | POA: Diagnosis not present

## 2016-01-01 DIAGNOSIS — M25552 Pain in left hip: Secondary | ICD-10-CM | POA: Diagnosis not present

## 2016-01-01 DIAGNOSIS — Z713 Dietary counseling and surveillance: Secondary | ICD-10-CM | POA: Diagnosis not present

## 2016-01-09 ENCOUNTER — Telehealth: Payer: Self-pay | Admitting: *Deleted

## 2016-01-09 MED ORDER — ESTRADIOL 10 MCG VA TABS: ORAL_TABLET | VAGINAL | 2 refills | Status: DC

## 2016-01-09 NOTE — Telephone Encounter (Signed)
Pt requests vagifem, will rx in Dr Johnnye Sima absence

## 2016-01-09 NOTE — Telephone Encounter (Signed)
Pt requesting Rx for Vagifem.

## 2016-02-01 ENCOUNTER — Ambulatory Visit (INDEPENDENT_AMBULATORY_CARE_PROVIDER_SITE_OTHER): Payer: PPO | Admitting: Obstetrics and Gynecology

## 2016-02-01 ENCOUNTER — Encounter: Payer: Self-pay | Admitting: Obstetrics and Gynecology

## 2016-02-01 VITALS — BP 110/78 | HR 70 | Wt 153.0 lb

## 2016-02-01 DIAGNOSIS — N952 Postmenopausal atrophic vaginitis: Secondary | ICD-10-CM

## 2016-02-01 DIAGNOSIS — F603 Borderline personality disorder: Secondary | ICD-10-CM

## 2016-02-01 DIAGNOSIS — F419 Anxiety disorder, unspecified: Secondary | ICD-10-CM

## 2016-02-01 NOTE — Progress Notes (Addendum)
[facility] Visit  02/01/16          Patient name: Teresa Galloway MRN [id number]  Date of birth: Nov 15, 1955  CC & HPI:  Teresa Galloway is a 60 y.o. female presenting today for recent emotional changes for the past few weeks. Patient states she is having episodes of being tearful and crying such as when watching certain television shows. She questions whether this is related to being on oral estrace.   She does note a history of depression and anxiety which she states has been well-managed with medication for many years.   ROS:  ROS +dysphoric mood Recently d/c of primary relationship with partner who lived in [place]. Not ready to resume initimate relationships.  Pertinent History Reviewed:   Reviewed: Significant for depression Medical         Past Medical History:  Diagnosis Date  . Acute pericarditis    a. Dx 12/2013.  Marland Kitchen Anxiety   . Depression   . Dyslipidemia   . Hx of migraines    a. On propranolol.  . Hypothyroid   . Pericardial effusion    a. Dx 12/2013 - moderate without hemodynamic compromise.                              Surgical Hx:    Past Surgical History:  Procedure Laterality Date  . BREAST SURGERY    . CERVICAL FUSION    . CHOLECYSTECTOMY    . NASAL SINUS SURGERY    . VAGINAL HYSTERECTOMY     Medications: Reviewed & Updated - see associated section                       Current Outpatient Prescriptions:  .  ALPRAZolam (XANAX) 0.5 MG tablet, Take 1 mg by mouth at bedtime as needed for anxiety. , Disp: , Rfl:  .  ARIPiprazole (ABILIFY) 5 MG tablet, Take 5 mg by mouth daily., Disp: , Rfl:  .  citalopram (CELEXA) 10 MG tablet, Take 10 mg by mouth at bedtime., Disp: , Rfl:  .  diphenhydrAMINE (BENADRYL) 50 MG capsule, Take 50 mg by mouth every 6 (six) hours as needed for allergies., Disp: , Rfl:  .  EPIPEN 2-PAK 0.3 MG/0.3ML SOAJ injection, INJECT AS NEEDED., Disp: , Rfl: 1 .  ergocalciferol (VITAMIN D2) 50000 UNITS capsule, Take 50,000 Units by mouth  once a week., Disp: , Rfl:  .  estradiol (ESTRACE) 1 MG tablet, Take 1 tablet (1 mg total) by mouth daily., Disp: 30 tablet, Rfl: 11 .  lamoTRIgine (LAMICTAL) 100 MG tablet, Take 100 mg by mouth 2 (two) times daily., Disp: , Rfl:  .  pantoprazole (PROTONIX) 40 MG tablet, Take 40 mg by mouth daily., Disp: , Rfl:  .  propranolol ER (INDERAL LA) 60 MG 24 hr capsule, TAKE ONE CAPSULE BY MOUTH DAILY, Disp: 30 capsule, Rfl: 3 .  rizatriptan (MAXALT) 10 MG tablet, Take 10 mg by mouth as needed for migraine. May repeat in 2 hours if needed, Disp: , Rfl:  .  ESTRACE VAGINAL 0.1 MG/GM vaginal cream, , Disp: , Rfl:  .  Estradiol 10 MCG TABS vaginal tablet, Place 1 tablet in vagina daily for 2 weeks then 1 2 x weekly (Patient not taking: Reported on 02/01/2016), Disp: 18 tablet, Rfl: 2 .  levothyroxine (SYNTHROID, LEVOTHROID) 75 MCG tablet, Take 75 mcg by mouth daily before breakfast., Disp: ,  Rfl:  .  ROPINIRole HCl (REQUIP PO), Take 1 tablet by mouth at bedtime., Disp: , Rfl:  .  venlafaxine XR (EFFEXOR-XR) 150 MG 24 hr capsule, Take 300 mg by mouth daily with breakfast., Disp: , Rfl:    Social History: Reviewed -  reports that she quit smoking about 3 years ago. Her smoking use included Cigarettes. She started smoking about 40 years ago. She has a 30.00 pack-year smoking history. She has never used smokeless tobacco.  Objective Findings:  Vitals: Blood pressure 110/78, pulse 70, weight 153 lb (69.4 kg).  Physical Examination: not indicated    Assessment & Plan:   A:  1. Emotional instability, possibly hormonal related 2   Atrophic vaginitis  P:  1. Cease taking oral estradiol for 2 weeks, after that begin taking vaginal estradiol 2. Advised to try cornhuskers lotion for vaginal lubrication and continue with use of vaginal estradiol 2x/wk  The provider spent over 25 minutes with the visit, including pre visit review, documentation, with >than 50% spent in counseling and coordination of care.    By signing my name below, I, Teresa Galloway, attest that this documentation has been prepared under the direction and in the presence of Jonnie Kind, MD. Electronically Signed: Sonum Galloway, Education administrator. 02/01/16. 11:03 AM.  I personally performed the services described in this documentation, which was SCRIBED in my presence. The recorded information has been reviewed and considered accurate. It has been edited as necessary during review. Jonnie Kind, MD

## 2016-03-07 DIAGNOSIS — Z6824 Body mass index (BMI) 24.0-24.9, adult: Secondary | ICD-10-CM | POA: Diagnosis not present

## 2016-03-07 DIAGNOSIS — J069 Acute upper respiratory infection, unspecified: Secondary | ICD-10-CM | POA: Diagnosis not present

## 2016-03-07 DIAGNOSIS — Z299 Encounter for prophylactic measures, unspecified: Secondary | ICD-10-CM | POA: Diagnosis not present

## 2016-03-07 DIAGNOSIS — Z87891 Personal history of nicotine dependence: Secondary | ICD-10-CM | POA: Diagnosis not present

## 2016-03-07 DIAGNOSIS — Z713 Dietary counseling and surveillance: Secondary | ICD-10-CM | POA: Diagnosis not present

## 2016-03-29 ENCOUNTER — Other Ambulatory Visit: Payer: Self-pay | Admitting: Obstetrics and Gynecology

## 2016-03-29 ENCOUNTER — Telehealth: Payer: Self-pay | Admitting: Obstetrics and Gynecology

## 2016-03-29 MED ORDER — ESTRADIOL 10 MCG VA TABS: ORAL_TABLET | VAGINAL | 11 refills | Status: DC

## 2016-03-29 NOTE — Progress Notes (Signed)
refil vagifem tablets. X 1 yr./

## 2016-03-29 NOTE — Telephone Encounter (Signed)
Spoke with pt. Pt is on Vagifem 10 mcg that she inserts vaginally. She uses it twice a week. She will run out Thursday. Can you refill? Thanks!! [place]

## 2016-04-03 DIAGNOSIS — I10 Essential (primary) hypertension: Secondary | ICD-10-CM | POA: Diagnosis not present

## 2016-04-03 DIAGNOSIS — Z299 Encounter for prophylactic measures, unspecified: Secondary | ICD-10-CM | POA: Diagnosis not present

## 2016-04-03 DIAGNOSIS — Z789 Other specified health status: Secondary | ICD-10-CM | POA: Diagnosis not present

## 2016-04-03 DIAGNOSIS — Z87891 Personal history of nicotine dependence: Secondary | ICD-10-CM | POA: Diagnosis not present

## 2016-04-03 DIAGNOSIS — J069 Acute upper respiratory infection, unspecified: Secondary | ICD-10-CM | POA: Diagnosis not present

## 2016-04-03 DIAGNOSIS — G4700 Insomnia, unspecified: Secondary | ICD-10-CM | POA: Diagnosis not present

## 2016-04-04 ENCOUNTER — Other Ambulatory Visit: Payer: Self-pay | Admitting: Obstetrics and Gynecology

## 2016-04-05 ENCOUNTER — Telehealth: Payer: Self-pay | Admitting: Obstetrics and Gynecology

## 2016-04-05 NOTE — Telephone Encounter (Signed)
Called patient to inform her prescription was sent to [facility] in [place]

## 2016-04-06 NOTE — Telephone Encounter (Signed)
Needs f/u appt 

## 2016-04-07 NOTE — Telephone Encounter (Signed)
Informed patient prescription was sent to pharmacy. Pt states she has already got it filled.

## 2016-04-16 ENCOUNTER — Other Ambulatory Visit: Payer: Self-pay | Admitting: *Deleted

## 2016-05-24 DIAGNOSIS — H04123 Dry eye syndrome of bilateral lacrimal glands: Secondary | ICD-10-CM | POA: Diagnosis not present

## 2016-05-24 DIAGNOSIS — H40033 Anatomical narrow angle, bilateral: Secondary | ICD-10-CM | POA: Diagnosis not present

## 2016-06-11 DIAGNOSIS — Z6824 Body mass index (BMI) 24.0-24.9, adult: Secondary | ICD-10-CM | POA: Diagnosis not present

## 2016-06-11 DIAGNOSIS — R0989 Other specified symptoms and signs involving the circulatory and respiratory systems: Secondary | ICD-10-CM | POA: Diagnosis not present

## 2016-06-11 DIAGNOSIS — F329 Major depressive disorder, single episode, unspecified: Secondary | ICD-10-CM | POA: Diagnosis not present

## 2016-06-11 DIAGNOSIS — Z299 Encounter for prophylactic measures, unspecified: Secondary | ICD-10-CM | POA: Diagnosis not present

## 2016-06-11 DIAGNOSIS — E039 Hypothyroidism, unspecified: Secondary | ICD-10-CM | POA: Diagnosis not present

## 2016-06-11 DIAGNOSIS — Z87891 Personal history of nicotine dependence: Secondary | ICD-10-CM | POA: Diagnosis not present

## 2016-06-11 DIAGNOSIS — Z713 Dietary counseling and surveillance: Secondary | ICD-10-CM | POA: Diagnosis not present

## 2016-06-11 DIAGNOSIS — J069 Acute upper respiratory infection, unspecified: Secondary | ICD-10-CM | POA: Diagnosis not present

## 2016-06-11 DIAGNOSIS — K219 Gastro-esophageal reflux disease without esophagitis: Secondary | ICD-10-CM | POA: Diagnosis not present

## 2016-06-20 ENCOUNTER — Other Ambulatory Visit: Payer: Self-pay | Admitting: Obstetrics & Gynecology

## 2016-08-21 DIAGNOSIS — Z6824 Body mass index (BMI) 24.0-24.9, adult: Secondary | ICD-10-CM | POA: Diagnosis not present

## 2016-08-21 DIAGNOSIS — Z1211 Encounter for screening for malignant neoplasm of colon: Secondary | ICD-10-CM | POA: Diagnosis not present

## 2016-08-21 DIAGNOSIS — G43909 Migraine, unspecified, not intractable, without status migrainosus: Secondary | ICD-10-CM | POA: Diagnosis not present

## 2016-08-21 DIAGNOSIS — F329 Major depressive disorder, single episode, unspecified: Secondary | ICD-10-CM | POA: Diagnosis not present

## 2016-08-21 DIAGNOSIS — G2581 Restless legs syndrome: Secondary | ICD-10-CM | POA: Diagnosis not present

## 2016-08-21 DIAGNOSIS — Z299 Encounter for prophylactic measures, unspecified: Secondary | ICD-10-CM | POA: Diagnosis not present

## 2016-08-21 DIAGNOSIS — Z7189 Other specified counseling: Secondary | ICD-10-CM | POA: Diagnosis not present

## 2016-08-21 DIAGNOSIS — Z0000 Encounter for general adult medical examination without abnormal findings: Secondary | ICD-10-CM | POA: Diagnosis not present

## 2016-08-21 DIAGNOSIS — G4730 Sleep apnea, unspecified: Secondary | ICD-10-CM | POA: Diagnosis not present

## 2016-08-21 DIAGNOSIS — Z1389 Encounter for screening for other disorder: Secondary | ICD-10-CM | POA: Diagnosis not present

## 2016-08-21 DIAGNOSIS — E039 Hypothyroidism, unspecified: Secondary | ICD-10-CM | POA: Diagnosis not present

## 2016-11-12 ENCOUNTER — Other Ambulatory Visit: Payer: Self-pay | Admitting: Nurse Practitioner

## 2016-11-12 DIAGNOSIS — Z9882 Breast implant status: Secondary | ICD-10-CM

## 2016-11-19 DIAGNOSIS — E039 Hypothyroidism, unspecified: Secondary | ICD-10-CM | POA: Diagnosis not present

## 2016-11-19 DIAGNOSIS — F419 Anxiety disorder, unspecified: Secondary | ICD-10-CM | POA: Diagnosis not present

## 2016-11-19 DIAGNOSIS — Z6823 Body mass index (BMI) 23.0-23.9, adult: Secondary | ICD-10-CM | POA: Diagnosis not present

## 2016-11-19 DIAGNOSIS — M25532 Pain in left wrist: Secondary | ICD-10-CM | POA: Diagnosis not present

## 2016-11-19 DIAGNOSIS — Z299 Encounter for prophylactic measures, unspecified: Secondary | ICD-10-CM | POA: Diagnosis not present

## 2016-11-20 DIAGNOSIS — M25632 Stiffness of left wrist, not elsewhere classified: Secondary | ICD-10-CM | POA: Diagnosis not present

## 2016-11-20 DIAGNOSIS — M25532 Pain in left wrist: Secondary | ICD-10-CM | POA: Diagnosis not present

## 2016-11-27 ENCOUNTER — Ambulatory Visit
Admission: RE | Admit: 2016-11-27 | Discharge: 2016-11-27 | Disposition: A | Discharge status: Home / Self Care | Payer: Medicare Other | Source: Ambulatory Visit | Attending: Nurse Practitioner | Admitting: Nurse Practitioner

## 2016-11-27 DIAGNOSIS — N6489 Other specified disorders of breast: Secondary | ICD-10-CM | POA: Diagnosis not present

## 2016-11-27 DIAGNOSIS — Z9882 Breast implant status: Secondary | ICD-10-CM

## 2016-11-27 MED ORDER — GADOBENATE DIMEGLUMINE 529 MG/ML IV SOLN
15.0000 mL | Freq: Once | INTRAVENOUS | Status: AC | PRN
  Administered 2016-11-27: 15 mL via INTRAVENOUS

## 2017-01-01 ENCOUNTER — Encounter (INDEPENDENT_AMBULATORY_CARE_PROVIDER_SITE_OTHER): Payer: Self-pay

## 2017-01-01 ENCOUNTER — Encounter: Payer: Self-pay | Admitting: Obstetrics and Gynecology

## 2017-01-01 ENCOUNTER — Ambulatory Visit (INDEPENDENT_AMBULATORY_CARE_PROVIDER_SITE_OTHER): Payer: PPO | Admitting: Obstetrics and Gynecology

## 2017-01-01 VITALS — BP 110/60 | HR 92 | Ht 66.0 in | Wt 146.0 lb

## 2017-01-01 DIAGNOSIS — N952 Postmenopausal atrophic vaginitis: Secondary | ICD-10-CM

## 2017-01-01 DIAGNOSIS — Z01411 Encounter for gynecological examination (general) (routine) with abnormal findings: Secondary | ICD-10-CM

## 2017-01-01 NOTE — Progress Notes (Addendum)
Patient ID: Teresa Galloway, female   DOB: 1955/10/23, 61 y.o.   MRN: [id number]  Assessment:  1. Annual Gyn Exam 2. Dyspareunia 3. Post menopausal status 4. Short vaginal length status post hysterectomy  Plan:  1. return annually or prn 2.   Annual mammogram advised after age 46 3.   Self exam at home 4.   Increase dosage of Yuvafem and refill Rx. Send to [facility] in [place] Subjective:  Teresa Galloway is a 61 y.o. female No obstetric history on file. who presents for annual exam. No LMP recorded. Patient has had a hysterectomy. She still has her ovaries.   The patient has complaints today of anxiety and notes that she has had a lot of caffeine. She reports having issues with intercourse. At her last visit on 02/01/2016, she reported that her boyfriend was very large () and they were unable to have intercourse. She was prescribed estradiol which she has been using for the last year with mild relief. Her boyfriend lives in another country and will be visiting soon. Pt denies any other sx at this time.   The following portions of the patient's history were reviewed and updated as appropriate: allergies, current medications, past family history, past medical history, past social history, past surgical history and problem list.  Past Medical History:  Diagnosis Date  . Acute pericarditis    a. Dx 12/2013.  Marland Kitchen Anxiety   . Depression   . Dyslipidemia   . Hx of migraines    a. On propranolol.  . Hypothyroid   . Pericardial effusion    a. Dx 12/2013 - moderate without hemodynamic compromise.    Past Surgical History:  Procedure Laterality Date  . BREAST SURGERY    . CERVICAL FUSION    . CHOLECYSTECTOMY    . NASAL SINUS SURGERY    . VAGINAL HYSTERECTOMY       Current Outpatient Prescriptions:  .  ALPRAZolam (XANAX) 0.5 MG tablet, Take 1 mg by mouth at bedtime as needed for anxiety. , Disp: , Rfl:  .  citalopram (CELEXA) 10 MG tablet, Take 10 mg by mouth at bedtime., Disp: , Rfl:  .   clonazePAM (KLONOPIN) 0.5 MG tablet, Take 0.5 mg by mouth 2 (two) times daily as needed for anxiety., Disp: , Rfl:  .  ergocalciferol (VITAMIN D2) 50000 UNITS capsule, Take 50,000 Units by mouth once a week., Disp: , Rfl:  .  levothyroxine (SYNTHROID, LEVOTHROID) 75 MCG tablet, Take 75 mcg by mouth daily before breakfast., Disp: , Rfl:  .  modafinil (PROVIGIL) 100 MG tablet, Take 100 mg by mouth daily., Disp: , Rfl:  .  omeprazole (PRILOSEC) 20 MG capsule, Take 20 mg by mouth daily., Disp: , Rfl:  .  propranolol ER (INDERAL LA) 60 MG 24 hr capsule, TAKE ONE CAPSULE BY MOUTH DAILY, Disp: 30 capsule, Rfl: 3 .  ROPINIRole HCl (REQUIP PO), Take 1 tablet by mouth at bedtime., Disp: , Rfl:  .  YUVAFEM 10 MCG TABS vaginal tablet, INSERT ONE TABLET VAGINALLY ONCE DAILY FOR 2 WEEKS, THEN INSERT ONE TABLET TWICE A WEEK FOR 2 WEEKS, Disp: 18 tablet, Rfl: 2 .  ARIPiprazole (ABILIFY) 5 MG tablet, Take 5 mg by mouth daily., Disp: , Rfl:  .  diphenhydrAMINE (BENADRYL) 50 MG capsule, Take 50 mg by mouth every 6 (six) hours as needed for allergies., Disp: , Rfl:  .  EPIPEN 2-PAK 0.3 MG/0.3ML SOAJ injection, INJECT AS NEEDED., Disp: , Rfl: 1 .  estradiol (  ESTRACE) 1 MG tablet, Take 1 tablet (1 mg total) by mouth daily., Disp: 30 tablet, Rfl: 11 .  lamoTRIgine (LAMICTAL) 100 MG tablet, Take 100 mg by mouth 2 (two) times daily., Disp: , Rfl:  .  pantoprazole (PROTONIX) 40 MG tablet, Take 40 mg by mouth daily., Disp: , Rfl:  .  rizatriptan (MAXALT) 10 MG tablet, Take 10 mg by mouth as needed for migraine. May repeat in 2 hours if needed, Disp: , Rfl:  .  venlafaxine XR (EFFEXOR-XR) 150 MG 24 hr capsule, Take 300 mg by mouth daily with breakfast., Disp: , Rfl:   Review of Systems Constitutional: negative Gastrointestinal: negative Genitourinary: negative  Objective:  BP 110/60   Pulse 92   Ht 5\' 6"  (1.676 m)   Wt 146 lb (66.2 kg)   BMI 23.57 kg/m    BMI: Body mass index is 23.57 kg/m.  General Appearance:  Alert, appropriate appearance for age. No acute distress HEENT: Grossly normal Neck / Thyroid:  Cardiovascular: RRR; normal S1, S2, no murmur Lungs: CTA bilaterally Back: No CVAT Breast Exam: bilateral implants present Gastrointestinal: Soft, non-tender, no masses or organomegaly Pelvic Exam:  External genitalia: Small cluster of non tender inflammed area 1 cm diameter on left labia majora Excellent muscle tone hymen remnant present as pt nulligravida Rectovaginal: good support Lymphatic Exam: Non-palpable nodes in neck, clavicular, axillary, or inguinal regions  Skin: no rash or abnormalities Neurologic: Normal gait and speech, no tremor  Psychiatric: Alert and oriented, appropriate affect.  Urinalysis:Not done   Lengthy discussion on sexual positions, tips and advice.   Mallory Shirk. MD Pgr [phone] 12:04 PM  By signing my name below, I, Margit Banda, attest that this documentation has been prepared under the direction and in the presence of Jonnie Kind, MD. Electronically Signed: Margit Banda, Medical Scribe. 01/01/17. 12:04 PM.  I personally performed the services described in this documentation, which was SCRIBED in my presence. The recorded information has been reviewed and considered accurate. It has been edited as necessary during review. Jonnie Kind, MD

## 2017-01-02 ENCOUNTER — Telehealth: Payer: Self-pay | Admitting: Obstetrics & Gynecology

## 2017-01-02 DIAGNOSIS — Z299 Encounter for prophylactic measures, unspecified: Secondary | ICD-10-CM | POA: Diagnosis not present

## 2017-01-02 DIAGNOSIS — Z6823 Body mass index (BMI) 23.0-23.9, adult: Secondary | ICD-10-CM | POA: Diagnosis not present

## 2017-01-02 DIAGNOSIS — F329 Major depressive disorder, single episode, unspecified: Secondary | ICD-10-CM | POA: Diagnosis not present

## 2017-01-02 DIAGNOSIS — Z87891 Personal history of nicotine dependence: Secondary | ICD-10-CM | POA: Diagnosis not present

## 2017-01-02 DIAGNOSIS — J209 Acute bronchitis, unspecified: Secondary | ICD-10-CM | POA: Diagnosis not present

## 2017-01-02 DIAGNOSIS — G4730 Sleep apnea, unspecified: Secondary | ICD-10-CM | POA: Diagnosis not present

## 2017-01-02 NOTE — Telephone Encounter (Signed)
Yuvafem 8mcg #40 insert 1 tablet vaginally x3 months with 3 refills called to pharmacy per Dr Glo Herring.

## 2017-01-03 ENCOUNTER — Telehealth: Payer: Self-pay | Admitting: Obstetrics and Gynecology

## 2017-01-03 NOTE — Telephone Encounter (Signed)
Teresa Galloway from [facility] called stating that Dr. Glo Herring has prescribed her a medication that she could use every other day. Dr. Glo Herring has prescribed her medication 3 times a week. Pt would like direction changed for less co pay. Please contact pt

## 2017-01-03 NOTE — Telephone Encounter (Signed)
Pharmacy states copay will be $160 for 3 times a week dosage but if changed to every other day will be $85. Per Dr Glo Herring ok to change to every other day.

## 2017-01-09 DIAGNOSIS — Z87891 Personal history of nicotine dependence: Secondary | ICD-10-CM | POA: Diagnosis not present

## 2017-01-09 DIAGNOSIS — Z6823 Body mass index (BMI) 23.0-23.9, adult: Secondary | ICD-10-CM | POA: Diagnosis not present

## 2017-01-09 DIAGNOSIS — Z299 Encounter for prophylactic measures, unspecified: Secondary | ICD-10-CM | POA: Diagnosis not present

## 2017-01-09 DIAGNOSIS — J209 Acute bronchitis, unspecified: Secondary | ICD-10-CM | POA: Diagnosis not present

## 2017-01-09 DIAGNOSIS — Z713 Dietary counseling and surveillance: Secondary | ICD-10-CM | POA: Diagnosis not present

## 2017-01-13 MED ORDER — ESTRADIOL 10 MCG VA TABS: 20.0000 ug | ORAL_TABLET | Freq: Every day | VAGINAL | 11 refills | Status: DC

## 2017-02-14 ENCOUNTER — Telehealth: Payer: Self-pay | Admitting: Obstetrics and Gynecology

## 2017-02-14 NOTE — Telephone Encounter (Signed)
Patient called stating that she needs a call from Dr. Glo Galloway regarding her medication that they discussed, Pt states that the directions on the bottle is not what they discussed. Please contact pt

## 2017-02-14 NOTE — Telephone Encounter (Signed)
Pt called wanting to clarify her medication dosage. She states that what is written on the bottle is not what Dr Glo Herring told her to do. She states he told her to insert one tablet every other night. Advised pt to continue taking as they discussed.

## 2017-02-19 DIAGNOSIS — J42 Unspecified chronic bronchitis: Secondary | ICD-10-CM | POA: Diagnosis not present

## 2017-02-19 DIAGNOSIS — Z6822 Body mass index (BMI) 22.0-22.9, adult: Secondary | ICD-10-CM | POA: Diagnosis not present

## 2017-02-19 DIAGNOSIS — F419 Anxiety disorder, unspecified: Secondary | ICD-10-CM | POA: Diagnosis not present

## 2017-02-19 DIAGNOSIS — R42 Dizziness and giddiness: Secondary | ICD-10-CM | POA: Diagnosis not present

## 2017-02-19 DIAGNOSIS — Z299 Encounter for prophylactic measures, unspecified: Secondary | ICD-10-CM | POA: Diagnosis not present

## 2017-02-20 DIAGNOSIS — F419 Anxiety disorder, unspecified: Secondary | ICD-10-CM | POA: Diagnosis not present

## 2017-02-20 DIAGNOSIS — R42 Dizziness and giddiness: Secondary | ICD-10-CM | POA: Diagnosis not present

## 2017-04-03 DIAGNOSIS — H6692 Otitis media, unspecified, left ear: Secondary | ICD-10-CM | POA: Diagnosis not present

## 2017-04-03 DIAGNOSIS — Z6822 Body mass index (BMI) 22.0-22.9, adult: Secondary | ICD-10-CM | POA: Diagnosis not present

## 2017-04-03 DIAGNOSIS — Z299 Encounter for prophylactic measures, unspecified: Secondary | ICD-10-CM | POA: Diagnosis not present

## 2017-04-03 DIAGNOSIS — Z9882 Breast implant status: Secondary | ICD-10-CM | POA: Diagnosis not present

## 2017-04-03 DIAGNOSIS — Z87891 Personal history of nicotine dependence: Secondary | ICD-10-CM | POA: Diagnosis not present

## 2017-04-03 DIAGNOSIS — Z1239 Encounter for other screening for malignant neoplasm of breast: Secondary | ICD-10-CM | POA: Diagnosis not present

## 2017-04-21 ENCOUNTER — Ambulatory Visit: Payer: PPO | Admitting: Obstetrics and Gynecology

## 2017-04-23 ENCOUNTER — Ambulatory Visit: Payer: PPO | Admitting: Obstetrics and Gynecology

## 2017-04-30 ENCOUNTER — Ambulatory Visit: Payer: PPO | Admitting: Obstetrics and Gynecology

## 2017-04-30 ENCOUNTER — Encounter: Payer: Self-pay | Admitting: Obstetrics and Gynecology

## 2017-04-30 ENCOUNTER — Encounter (INDEPENDENT_AMBULATORY_CARE_PROVIDER_SITE_OTHER): Payer: Self-pay

## 2017-04-30 VITALS — BP 132/72 | HR 70 | Ht 66.0 in | Wt 142.2 lb

## 2017-04-30 DIAGNOSIS — N952 Postmenopausal atrophic vaginitis: Secondary | ICD-10-CM | POA: Diagnosis not present

## 2017-04-30 DIAGNOSIS — E2839 Other primary ovarian failure: Secondary | ICD-10-CM

## 2017-04-30 NOTE — Progress Notes (Signed)
See above note

## 2017-04-30 NOTE — Progress Notes (Signed)
Patient ID: Teresa Galloway, female   DOB: Jan 27, 1956, 62 y.o.   MRN: [id number]   [facility] Visit  @DATE @            Patient name: Teresa Galloway MRN [id number]  Date of birth: 01-17-1956  CC & HPI:  Teresa Galloway is a 62 y.o. female presenting today to discuss Yuvafem medication that she was put on to alleviate her discomfort during intercourse with her "large" partner. Per chart review, she was seen on 01/01/2017. At that time she was prescribed the medication. She reports that it costs her $85 a month after insurance. She is no longer sexually active and is wondering if she still needs to be on the medication. Teresa Galloway also adds that she doesn't like to put something in her body if she doesn't need to. The patient reports that she is very stressed. She denies fever, chills, or any other symptoms or complaints at this time.   ROS:  ROS -fever -chills All systems are negative except as noted in the HPI and PMH.   Pertinent History Reviewed:   Reviewed: Medical         Past Medical History:  Diagnosis Date  . Acute pericarditis    a. Dx 12/2013.  Marland Kitchen Anxiety   . Depression   . Dyslipidemia   . Hx of migraines    a. On propranolol.  . Hypothyroid   . Pericardial effusion    a. Dx 12/2013 - moderate without hemodynamic compromise.                              Surgical Hx:    Past Surgical History:  Procedure Laterality Date  . BREAST SURGERY    . CERVICAL FUSION    . CHOLECYSTECTOMY    . NASAL SINUS SURGERY    . VAGINAL HYSTERECTOMY     Medications: Reviewed & Updated - see associated section                       Current Outpatient Medications:  .  buPROPion (WELLBUTRIN SR) 200 MG 12 hr tablet, , Disp: , Rfl:  .  citalopram (CELEXA) 10 MG tablet, Take 10 mg by mouth at bedtime., Disp: , Rfl:  .  clonazePAM (KLONOPIN) 0.5 MG tablet, Take 0.5 mg by mouth 2 (two) times daily as needed for anxiety., Disp: , Rfl:  .  ergocalciferol (VITAMIN D2) 50000 UNITS capsule, Take  50,000 Units by mouth once a week., Disp: , Rfl:  .  lamoTRIgine (LAMICTAL) 100 MG tablet, Take 100 mg by mouth 2 (two) times daily., Disp: , Rfl:  .  levothyroxine (SYNTHROID, LEVOTHROID) 75 MCG tablet, Take 75 mcg by mouth daily before breakfast., Disp: , Rfl:  .  omeprazole (PRILOSEC) 20 MG capsule, Take 20 mg by mouth daily., Disp: , Rfl:  .  propranolol ER (INDERAL LA) 60 MG 24 hr capsule, TAKE ONE CAPSULE BY MOUTH DAILY, Disp: 30 capsule, Rfl: 3 .  ROPINIRole HCl (REQUIP PO), Take 1 tablet by mouth at bedtime., Disp: , Rfl:  .  YUVAFEM 10 MCG TABS vaginal tablet, INSERT ONE TABLET VAGINALLY ONCE DAILY FOR 2 WEEKS, THEN INSERT ONE TABLET TWICE A WEEK FOR 2 WEEKS, Disp: 18 tablet, Rfl: 2 .  ALPRAZolam (XANAX) 0.5 MG tablet, Take 1 mg by mouth at bedtime as needed for anxiety. , Disp: , Rfl:  .  ARIPiprazole (ABILIFY)  5 MG tablet, Take 5 mg by mouth daily., Disp: , Rfl:  .  diphenhydrAMINE (BENADRYL) 50 MG capsule, Take 50 mg by mouth every 6 (six) hours as needed for allergies., Disp: , Rfl:  .  EPIPEN 2-PAK 0.3 MG/0.3ML SOAJ injection, INJECT AS NEEDED., Disp: , Rfl: 1 .  estradiol (ESTRACE) 1 MG tablet, Take 1 tablet (1 mg total) by mouth daily., Disp: 30 tablet, Rfl: 11 .  Estradiol (YUVAFEM) 10 MCG TABS vaginal tablet, Place 2 tablets (20 mcg total) vaginally at bedtime. (Patient not taking: Reported on 04/30/2017), Disp: 60 tablet, Rfl: 11 .  modafinil (PROVIGIL) 100 MG tablet, Take 100 mg by mouth daily., Disp: , Rfl:  .  pantoprazole (PROTONIX) 40 MG tablet, Take 40 mg by mouth daily., Disp: , Rfl:  .  rizatriptan (MAXALT) 10 MG tablet, Take 10 mg by mouth as needed for migraine. May repeat in 2 hours if needed, Disp: , Rfl:  .  venlafaxine XR (EFFEXOR-XR) 150 MG 24 hr capsule, Take 300 mg by mouth daily with breakfast., Disp: , Rfl:    Social History: Reviewed -  reports that she quit smoking about 4 years ago. Her smoking use included cigarettes. She started smoking about 41 years ago.  She has a 30.00 pack-year smoking history. she has never used smokeless tobacco.  Objective Findings:  Vitals: Blood pressure 132/72, pulse 70, height 5\' 6"  (1.676 m), weight 142 lb 3.2 oz (64.5 kg).  PHYSICAL EXAMINATION General appearance - alert, well appearing, and in no distress, oriented to person, place, and time and overweight Mental status - alert, oriented to person, place, and time, normal mood, behavior, speech, dress, motor activity, and thought processes, affect appropriate to mood  PELVIC DEFERRED  Assessment & Plan:   A:  1. Post menopausal hormone therapy, discontinue  P:  1. Stop Yuvafem, pt will call if anything changes 2. PRN    By signing my name below, I, Margit Banda, attest that this documentation has been prepared under the direction and in the presence of Jonnie Kind, MD. Electronically Signed: Margit Banda, Medical Scribe. 04/30/17. 2:23 PM.  I personally performed the services described in this documentation, which was SCRIBED in my presence. The recorded information has been reviewed and considered accurate. It has been edited as necessary during review. Jonnie Kind, MD

## 2017-05-23 DIAGNOSIS — H2513 Age-related nuclear cataract, bilateral: Secondary | ICD-10-CM | POA: Diagnosis not present

## 2017-05-23 DIAGNOSIS — H40033 Anatomical narrow angle, bilateral: Secondary | ICD-10-CM | POA: Diagnosis not present

## 2017-05-29 DIAGNOSIS — Z6822 Body mass index (BMI) 22.0-22.9, adult: Secondary | ICD-10-CM | POA: Diagnosis not present

## 2017-05-29 DIAGNOSIS — G4700 Insomnia, unspecified: Secondary | ICD-10-CM | POA: Diagnosis not present

## 2017-05-29 DIAGNOSIS — R110 Nausea: Secondary | ICD-10-CM | POA: Diagnosis not present

## 2017-05-29 DIAGNOSIS — F329 Major depressive disorder, single episode, unspecified: Secondary | ICD-10-CM | POA: Diagnosis not present

## 2017-05-29 DIAGNOSIS — F419 Anxiety disorder, unspecified: Secondary | ICD-10-CM | POA: Diagnosis not present

## 2017-05-29 DIAGNOSIS — Z299 Encounter for prophylactic measures, unspecified: Secondary | ICD-10-CM | POA: Diagnosis not present

## 2017-05-29 DIAGNOSIS — K219 Gastro-esophageal reflux disease without esophagitis: Secondary | ICD-10-CM | POA: Diagnosis not present

## 2017-06-13 DIAGNOSIS — H2511 Age-related nuclear cataract, right eye: Secondary | ICD-10-CM | POA: Diagnosis not present

## 2017-06-13 DIAGNOSIS — H25811 Combined forms of age-related cataract, right eye: Secondary | ICD-10-CM | POA: Diagnosis not present

## 2017-06-19 DIAGNOSIS — H04123 Dry eye syndrome of bilateral lacrimal glands: Secondary | ICD-10-CM | POA: Diagnosis not present

## 2017-06-27 DIAGNOSIS — H2512 Age-related nuclear cataract, left eye: Secondary | ICD-10-CM | POA: Diagnosis not present

## 2017-06-27 DIAGNOSIS — H25812 Combined forms of age-related cataract, left eye: Secondary | ICD-10-CM | POA: Diagnosis not present

## 2017-07-03 DIAGNOSIS — H04123 Dry eye syndrome of bilateral lacrimal glands: Secondary | ICD-10-CM | POA: Diagnosis not present

## 2017-07-07 DIAGNOSIS — H10013 Acute follicular conjunctivitis, bilateral: Secondary | ICD-10-CM | POA: Diagnosis not present

## 2017-07-14 DIAGNOSIS — H10013 Acute follicular conjunctivitis, bilateral: Secondary | ICD-10-CM | POA: Diagnosis not present

## 2017-07-31 DIAGNOSIS — H10013 Acute follicular conjunctivitis, bilateral: Secondary | ICD-10-CM | POA: Diagnosis not present

## 2017-08-15 DIAGNOSIS — H04123 Dry eye syndrome of bilateral lacrimal glands: Secondary | ICD-10-CM | POA: Diagnosis not present

## 2017-08-20 DIAGNOSIS — Z299 Encounter for prophylactic measures, unspecified: Secondary | ICD-10-CM | POA: Diagnosis not present

## 2017-08-20 DIAGNOSIS — J069 Acute upper respiratory infection, unspecified: Secondary | ICD-10-CM | POA: Diagnosis not present

## 2017-08-20 DIAGNOSIS — M26609 Unspecified temporomandibular joint disorder, unspecified side: Secondary | ICD-10-CM | POA: Diagnosis not present

## 2017-08-20 DIAGNOSIS — E039 Hypothyroidism, unspecified: Secondary | ICD-10-CM | POA: Diagnosis not present

## 2017-08-20 DIAGNOSIS — Z6822 Body mass index (BMI) 22.0-22.9, adult: Secondary | ICD-10-CM | POA: Diagnosis not present

## 2017-08-20 DIAGNOSIS — R030 Elevated blood-pressure reading, without diagnosis of hypertension: Secondary | ICD-10-CM | POA: Diagnosis not present

## 2017-08-21 DIAGNOSIS — H26492 Other secondary cataract, left eye: Secondary | ICD-10-CM | POA: Diagnosis not present

## 2017-08-28 DIAGNOSIS — J42 Unspecified chronic bronchitis: Secondary | ICD-10-CM | POA: Diagnosis not present

## 2017-08-28 DIAGNOSIS — M2669 Other specified disorders of temporomandibular joint: Secondary | ICD-10-CM | POA: Diagnosis not present

## 2017-08-28 DIAGNOSIS — Z6822 Body mass index (BMI) 22.0-22.9, adult: Secondary | ICD-10-CM | POA: Diagnosis not present

## 2017-08-28 DIAGNOSIS — Z299 Encounter for prophylactic measures, unspecified: Secondary | ICD-10-CM | POA: Diagnosis not present

## 2017-08-28 DIAGNOSIS — F419 Anxiety disorder, unspecified: Secondary | ICD-10-CM | POA: Diagnosis not present

## 2017-10-10 DIAGNOSIS — E039 Hypothyroidism, unspecified: Secondary | ICD-10-CM | POA: Diagnosis not present

## 2017-10-10 DIAGNOSIS — R030 Elevated blood-pressure reading, without diagnosis of hypertension: Secondary | ICD-10-CM | POA: Diagnosis not present

## 2017-10-10 DIAGNOSIS — Z6822 Body mass index (BMI) 22.0-22.9, adult: Secondary | ICD-10-CM | POA: Diagnosis not present

## 2017-10-10 DIAGNOSIS — K2970 Gastritis, unspecified, without bleeding: Secondary | ICD-10-CM | POA: Diagnosis not present

## 2017-10-10 DIAGNOSIS — Z299 Encounter for prophylactic measures, unspecified: Secondary | ICD-10-CM | POA: Diagnosis not present

## 2017-10-14 DIAGNOSIS — H04123 Dry eye syndrome of bilateral lacrimal glands: Secondary | ICD-10-CM | POA: Diagnosis not present

## 2017-10-24 DIAGNOSIS — K580 Irritable bowel syndrome with diarrhea: Secondary | ICD-10-CM | POA: Diagnosis not present

## 2017-10-24 DIAGNOSIS — I10 Essential (primary) hypertension: Secondary | ICD-10-CM | POA: Diagnosis not present

## 2017-10-24 DIAGNOSIS — K429 Umbilical hernia without obstruction or gangrene: Secondary | ICD-10-CM | POA: Diagnosis not present

## 2017-10-24 DIAGNOSIS — F419 Anxiety disorder, unspecified: Secondary | ICD-10-CM | POA: Diagnosis not present

## 2017-10-24 DIAGNOSIS — E039 Hypothyroidism, unspecified: Secondary | ICD-10-CM | POA: Diagnosis not present

## 2017-10-24 DIAGNOSIS — Z87891 Personal history of nicotine dependence: Secondary | ICD-10-CM | POA: Diagnosis not present

## 2017-10-24 DIAGNOSIS — Z79899 Other long term (current) drug therapy: Secondary | ICD-10-CM | POA: Diagnosis not present

## 2017-10-24 DIAGNOSIS — F329 Major depressive disorder, single episode, unspecified: Secondary | ICD-10-CM | POA: Diagnosis not present

## 2017-10-24 DIAGNOSIS — R112 Nausea with vomiting, unspecified: Secondary | ICD-10-CM | POA: Diagnosis not present

## 2017-10-24 DIAGNOSIS — R55 Syncope and collapse: Secondary | ICD-10-CM | POA: Diagnosis not present

## 2017-10-28 DIAGNOSIS — H25812 Combined forms of age-related cataract, left eye: Secondary | ICD-10-CM | POA: Diagnosis not present

## 2017-10-28 DIAGNOSIS — Z01818 Encounter for other preprocedural examination: Secondary | ICD-10-CM | POA: Diagnosis not present

## 2017-10-29 DIAGNOSIS — Z6822 Body mass index (BMI) 22.0-22.9, adult: Secondary | ICD-10-CM | POA: Diagnosis not present

## 2017-10-29 DIAGNOSIS — Z713 Dietary counseling and surveillance: Secondary | ICD-10-CM | POA: Diagnosis not present

## 2017-10-29 DIAGNOSIS — R197 Diarrhea, unspecified: Secondary | ICD-10-CM | POA: Diagnosis not present

## 2017-10-29 DIAGNOSIS — Z299 Encounter for prophylactic measures, unspecified: Secondary | ICD-10-CM | POA: Diagnosis not present

## 2017-10-29 DIAGNOSIS — K589 Irritable bowel syndrome without diarrhea: Secondary | ICD-10-CM | POA: Diagnosis not present

## 2017-11-06 DIAGNOSIS — H04123 Dry eye syndrome of bilateral lacrimal glands: Secondary | ICD-10-CM | POA: Diagnosis not present

## 2017-11-06 DIAGNOSIS — T8529XA Other mechanical complication of intraocular lens, initial encounter: Secondary | ICD-10-CM | POA: Diagnosis not present

## 2017-11-06 DIAGNOSIS — H5789 Other specified disorders of eye and adnexa: Secondary | ICD-10-CM | POA: Diagnosis not present

## 2017-11-28 ENCOUNTER — Ambulatory Visit (INDEPENDENT_AMBULATORY_CARE_PROVIDER_SITE_OTHER): Payer: PPO | Admitting: Internal Medicine

## 2017-11-28 ENCOUNTER — Other Ambulatory Visit (INDEPENDENT_AMBULATORY_CARE_PROVIDER_SITE_OTHER): Payer: Self-pay | Admitting: Internal Medicine

## 2017-11-28 ENCOUNTER — Encounter (INDEPENDENT_AMBULATORY_CARE_PROVIDER_SITE_OTHER): Payer: Self-pay | Admitting: Internal Medicine

## 2017-11-28 ENCOUNTER — Encounter (INDEPENDENT_AMBULATORY_CARE_PROVIDER_SITE_OTHER): Payer: Self-pay | Admitting: *Deleted

## 2017-11-28 ENCOUNTER — Telehealth (INDEPENDENT_AMBULATORY_CARE_PROVIDER_SITE_OTHER): Payer: Self-pay | Admitting: *Deleted

## 2017-11-28 VITALS — BP 112/60 | HR 72 | Temp 98.4°F | Ht 66.0 in | Wt 142.6 lb

## 2017-11-28 DIAGNOSIS — K580 Irritable bowel syndrome with diarrhea: Secondary | ICD-10-CM

## 2017-11-28 DIAGNOSIS — K589 Irritable bowel syndrome without diarrhea: Secondary | ICD-10-CM

## 2017-11-28 MED ORDER — SOD PHOS MONO-SOD PHOS DIBASIC 1.102-0.398 G PO TABS: 32.0000 | ORAL_TABLET | Freq: Once | ORAL | 0 refills | Status: AC

## 2017-11-28 MED ORDER — DICYCLOMINE HCL 10 MG PO CAPS: 10.0000 mg | ORAL_CAPSULE | Freq: Three times a day (TID) | ORAL | 4 refills | Status: AC

## 2017-11-28 NOTE — Progress Notes (Signed)
Subjective:    Patient ID: Teresa Galloway, female    DOB: [birth year], 62 y.o.   MRN: [id number] PCP Dr. Woody Seller HPI Referred b Dr. Woody Seller  For IBS. Saw Dr. Woody Seller 10/29/2017 and given Rx for Bentil 10mg  mg QID which she has finished. The Dicyclomine helped were her IBS.Seen in the ED 10/24/2017 with diarrhea, generalized abdominal pian, N and V x 3 weeks. Hx of IBS per ED records. CT abdomen/pelvis CM which was normal per records. She says her IBS will act up occasionally.  She has nausea with this.  She is not taking anything for her IBS.  Her appetite is okay. No weight loss. She is having a BM x 3 a day and are loose to water. She is not taking any Imodium. She says the Imodium will constipate her.  10/24/2017 H and H 15.5 and 45.2. Total bili 0.4, AST 12.7, ALT 12 ALP 97. Albumin 4.4 urine showed moderate amt of bacteria. She has never undergone a colonoscopy. Bit by a tick 2 yrs ago and was covered with an antibiotic and she has had bouts of diarrhea every since.  No family hx of colon.   Divorced, no children.  Disabled.  Review of Systems Past Medical History:  Diagnosis Date  . Acute pericarditis    a. Dx 12/2013.  Marland Kitchen Anxiety   . Depression   . Dyslipidemia   . Hx of migraines    a. On propranolol.  . Hypothyroid   . Pericardial effusion    a. Dx 12/2013 - moderate without hemodynamic compromise.    Past Surgical History:  Procedure Laterality Date  . BREAST SURGERY    . CERVICAL FUSION    . CHOLECYSTECTOMY    . NASAL SINUS SURGERY    . VAGINAL HYSTERECTOMY      Allergies  Allergen Reactions  . Other Swelling    Walnuts  . Sulfa Antibiotics Shortness Of Breath    Swelling of lips   . Sunscreens Anaphylaxis and Hives    Titanium and zinc oxides Eye swelling   . Seroquel [Quetiapine] Other (See Comments)    Pychosis (foggy, uncoordinated, slurred speech)  . Tape Hives    Current Outpatient Medications on File Prior to Visit  Medication Sig Dispense Refill  .  ALPRAZolam (XANAX) 0.5 MG tablet Take 1 mg by mouth at bedtime as needed for anxiety.     . citalopram (CELEXA) 10 MG tablet Take 10 mg by mouth at bedtime.    Marland Kitchen EPIPEN 2-PAK 0.3 MG/0.3ML SOAJ injection INJECT AS NEEDED.  1  . levothyroxine (SYNTHROID, LEVOTHROID) 75 MCG tablet Take 75 mcg by mouth daily before breakfast.    . omeprazole (PRILOSEC) 20 MG capsule Take 20 mg by mouth daily.    . pantoprazole (PROTONIX) 40 MG tablet Take 40 mg by mouth daily.    . rizatriptan (MAXALT) 10 MG tablet Take 10 mg by mouth as needed for migraine. May repeat in 2 hours if needed    . ROPINIRole HCl (REQUIP PO) Take 1 tablet by mouth at bedtime.    Marland Kitchen estradiol (ESTRACE) 1 MG tablet Take 1 tablet (1 mg total) by mouth daily. 30 tablet 11   No current facility-administered medications on file prior to visit.         Objective:   Physical Exam Blood pressure 112/60, pulse 72, temperature 98.4 F (36.9 C), height 5\' 6"  (1.676 m), weight 142 lb 9.6 oz (64.7 kg). Alert and oriented. Skin warm and  dry. Oral mucosa is moist.   . Sclera anicteric, conjunctivae is pink. Thyroid not enlarged. No cervical lymphadenopathy. Lungs clear. Heart regular rate and rhythm.  Abdomen is soft. Bowel sounds are positive. No hepatomegaly. No abdominal masses felt. No tenderness.  No edema to lower extremities.          Assessment & Plan:  IBS. Will give Rx for Dicyclomine 10mg  QID. She needs to consider when she wants a colonoscopy with propofol

## 2017-11-28 NOTE — Telephone Encounter (Addendum)
Patient needs osmo 

## 2017-11-28 NOTE — Patient Instructions (Signed)
Colonoscopy. The risks of bleeding, perforation and infection were reviewed with patient.  

## 2017-12-04 DIAGNOSIS — Z6822 Body mass index (BMI) 22.0-22.9, adult: Secondary | ICD-10-CM | POA: Diagnosis not present

## 2017-12-04 DIAGNOSIS — R5383 Other fatigue: Secondary | ICD-10-CM | POA: Diagnosis not present

## 2017-12-04 DIAGNOSIS — Z1339 Encounter for screening examination for other mental health and behavioral disorders: Secondary | ICD-10-CM | POA: Diagnosis not present

## 2017-12-04 DIAGNOSIS — Z1331 Encounter for screening for depression: Secondary | ICD-10-CM | POA: Diagnosis not present

## 2017-12-04 DIAGNOSIS — E7800 Pure hypercholesterolemia, unspecified: Secondary | ICD-10-CM | POA: Diagnosis not present

## 2017-12-04 DIAGNOSIS — Z7189 Other specified counseling: Secondary | ICD-10-CM | POA: Diagnosis not present

## 2017-12-04 DIAGNOSIS — E039 Hypothyroidism, unspecified: Secondary | ICD-10-CM | POA: Diagnosis not present

## 2017-12-04 DIAGNOSIS — Z299 Encounter for prophylactic measures, unspecified: Secondary | ICD-10-CM | POA: Diagnosis not present

## 2017-12-04 DIAGNOSIS — Z0000 Encounter for general adult medical examination without abnormal findings: Secondary | ICD-10-CM | POA: Diagnosis not present

## 2017-12-05 DIAGNOSIS — E7800 Pure hypercholesterolemia, unspecified: Secondary | ICD-10-CM | POA: Diagnosis not present

## 2017-12-05 DIAGNOSIS — E039 Hypothyroidism, unspecified: Secondary | ICD-10-CM | POA: Diagnosis not present

## 2017-12-05 DIAGNOSIS — Z79899 Other long term (current) drug therapy: Secondary | ICD-10-CM | POA: Diagnosis not present

## 2017-12-05 DIAGNOSIS — R5383 Other fatigue: Secondary | ICD-10-CM | POA: Diagnosis not present

## 2017-12-10 ENCOUNTER — Encounter (INDEPENDENT_AMBULATORY_CARE_PROVIDER_SITE_OTHER): Payer: Self-pay | Admitting: *Deleted

## 2017-12-12 DIAGNOSIS — H40033 Anatomical narrow angle, bilateral: Secondary | ICD-10-CM | POA: Diagnosis not present

## 2017-12-12 DIAGNOSIS — H04123 Dry eye syndrome of bilateral lacrimal glands: Secondary | ICD-10-CM | POA: Diagnosis not present

## 2017-12-16 ENCOUNTER — Other Ambulatory Visit (HOSPITAL_COMMUNITY): Payer: Self-pay

## 2017-12-18 DIAGNOSIS — G44219 Episodic tension-type headache, not intractable: Secondary | ICD-10-CM | POA: Diagnosis not present

## 2017-12-31 DIAGNOSIS — E2839 Other primary ovarian failure: Secondary | ICD-10-CM | POA: Diagnosis not present

## 2018-01-06 NOTE — Patient Instructions (Signed)
Teresa Galloway  01/06/2018     @PREFPERIOPPHARMACY @   Your procedure is scheduled on  01/16/2018   Report to [facility] at  615   A.M.  Call this number if you have problems the morning of surgery:  [phone]   Remember:  Do not eat or drink after midnight.  You may drink clear liquids until ( follow the instructions given to you) .  Clear liquids allowed are:                    Water, Juice (non-citric and without pulp), Carbonated beverages, Clear Tea, Black Coffee only, Plain Jell-O only, Gatorade and Plain Popsicles only    Take these medicines the morning of surgery with A SIP OF WATER  Xanax ( if needed), clexa, levothyroxine, claritin, propranolol, maxalt( if needed), requip.    Do not wear jewelry, make-up or nail polish.  Do not wear lotions, powders, or perfumes, or deodorant.  Do not shave 48 hours prior to surgery.  Men may shave face and neck.  Do not bring valuables to the hospital.  [facility] is not responsible for any belongings or valuables.  Contacts, dentures or bridgework may not be worn into surgery.  Leave your suitcase in the car.  After surgery it may be brought to your room.  For patients admitted to the hospital, discharge time will be determined by your treatment team.  Patients discharged the day of surgery will not be allowed to drive home.   Name and phone number of your driver:   family Special instructions:  Follow the diet and prep instructions given to you by Dr Olevia Perches office.  Please read over the following fact sheets that you were given. Anesthesia Post-op Instructions and Care and Recovery After Surgery       Colonoscopy, Adult A colonoscopy is an exam to look at the large intestine. It is done to check for problems, such as:  Lumps (tumors).  Growths (polyps).  Swelling (inflammation).  Bleeding.  What happens before the procedure? Eating and drinking Follow instructions from your doctor about eating  and drinking. These instructions may include:  A few days before the procedure - follow a low-fiber diet. ? Avoid nuts. ? Avoid seeds. ? Avoid dried fruit. ? Avoid raw fruits. ? Avoid vegetables.  1-3 days before the procedure - follow a clear liquid diet. Avoid liquids that have red or purple dye. Drink only clear liquids, such as: ? Clear broth or bouillon. ? Black coffee or tea. ? Clear juice. ? Clear soft drinks or sports drinks. ? Gelatin dessert. ? Popsicles.  On the day of the procedure - do not eat or drink anything during the 2 hours before the procedure.  Bowel prep If you were prescribed an oral bowel prep:  Take it as told by your doctor. Starting the day before your procedure, you will need to drink a lot of liquid. The liquid will cause you to poop (have bowel movements) until your poop is almost clear or light green.  If your skin or butt gets irritated from diarrhea, you may: ? Wipe the area with wipes that have medicine in them, such as adult wet wipes with aloe and vitamin E. ? Put something on your skin that soothes the area, such as petroleum jelly.  If you throw up (vomit) while drinking the bowel prep, take a break for up to 60 minutes. Then  begin the bowel prep again. If you keep throwing up and you cannot take the bowel prep without throwing up, call your doctor.  General instructions  Ask your doctor about changing or stopping your normal medicines. This is important if you take diabetes medicines or blood thinners.  Plan to have someone take you home from the hospital or clinic. What happens during the procedure?  An IV tube may be put into one of your veins.  You will be given medicine to help you relax (sedative).  To reduce your risk of infection: ? Your doctors will wash their hands. ? Your anal area will be washed with soap.  You will be asked to lie on your side with your knees bent.  Your doctor will get a long, thin, flexible tube  ready. The tube will have a camera and a light on the end.  The tube will be put into your anus.  The tube will be gently put into your large intestine.  Air will be delivered into your large intestine to keep it open. You may feel some pressure or cramping.  The camera will be used to take photos.  A small tissue sample may be removed from your body to be looked at under a microscope (biopsy). If any possible problems are found, the tissue will be sent to a lab for testing.  If small growths are found, your doctor may remove them and have them checked for cancer.  The tube that was put into your anus will be slowly removed. The procedure may vary among doctors and hospitals. What happens after the procedure?  Your doctor will check on you often until the medicines you were given have worn off.  Do not drive for 24 hours after the procedure.  You may have a small amount of blood in your poop.  You may pass gas.  You may have mild cramps or bloating in your belly (abdomen).  It is up to you to get the results of your procedure. Ask your doctor, or the department performing the procedure, when your results will be ready. This information is not intended to replace advice given to you by your health care provider. Make sure you discuss any questions you have with your health care provider. Document Released: 04/27/2010 Document Revised: 01/24/2016 Document Reviewed: 06/06/2015 Elsevier Interactive Patient Education  2017 [employer]  Colonoscopy, Adult, Care After This sheet gives you information about how to care for yourself after your procedure. Your health care provider may also give you more specific instructions. If you have problems or questions, contact your health care provider. What can I expect after the procedure? After the procedure, it is common to have:  A small amount of blood in your stool for 24 hours after the procedure.  Some gas.  Mild abdominal cramping  or bloating.  Follow these instructions at home: General instructions   For the first 24 hours after the procedure: ? Do not drive or use machinery. ? Do not sign important documents. ? Do not drink alcohol. ? Do your regular daily activities at a slower pace than normal. ? Eat soft, easy-to-digest foods. ? Rest often.  Take over-the-counter or prescription medicines only as told by your health care provider.  It is up to you to get the results of your procedure. Ask your health care provider, or the department performing the procedure, when your results will be ready. Relieving cramping and bloating  Try walking around when you have cramps  or feel bloated.  Apply heat to your abdomen as told by your health care provider. Use a heat source that your health care provider recommends, such as a moist heat pack or a heating pad. ? Place a towel between your skin and the heat source. ? Leave the heat on for 20-30 minutes. ? Remove the heat if your skin turns bright red. This is especially important if you are unable to feel pain, heat, or cold. You may have a greater risk of getting burned. Eating and drinking  Drink enough fluid to keep your urine clear or pale yellow.  Resume your normal diet as instructed by your health care provider. Avoid heavy or fried foods that are hard to digest.  Avoid drinking alcohol for as long as instructed by your health care provider. Contact a health care provider if:  You have blood in your stool 2-3 days after the procedure. Get help right away if:  You have more than a small spotting of blood in your stool.  You pass large blood clots in your stool.  Your abdomen is swollen.  You have nausea or vomiting.  You have a fever.  You have increasing abdominal pain that is not relieved with medicine. This information is not intended to replace advice given to you by your health care provider. Make sure you discuss any questions you have with  your health care provider. Document Released: 11/07/2003 Document Revised: 12/18/2015 Document Reviewed: 06/06/2015 Elsevier Interactive Patient Education  2018 [place] Anesthesia is a term that refers to techniques, procedures, and medicines that help a person stay safe and comfortable during a medical procedure. Monitored anesthesia care, or sedation, is one type of anesthesia. Your anesthesia specialist may recommend sedation if you will be having a procedure that does not require you to be unconscious, such as:  Cataract surgery.  A dental procedure.  A biopsy.  A colonoscopy.  During the procedure, you may receive a medicine to help you relax (sedative). There are three levels of sedation:  Mild sedation. At this level, you may feel awake and relaxed. You will be able to follow directions.  Moderate sedation. At this level, you will be sleepy. You may not remember the procedure.  Deep sedation. At this level, you will be asleep. You will not remember the procedure.  The more medicine you are given, the deeper your level of sedation will be. Depending on how you respond to the procedure, the anesthesia specialist may change your level of sedation or the type of anesthesia to fit your needs. An anesthesia specialist will monitor you closely during the procedure. Let your health care provider know about:  Any allergies you have.  All medicines you are taking, including vitamins, herbs, eye drops, creams, and over-the-counter medicines.  Any use of steroids (by mouth or as a cream).  Any problems you or family members have had with sedatives and anesthetic medicines.  Any blood disorders you have.  Any surgeries you have had.  Any medical conditions you have, such as sleep apnea.  Whether you are pregnant or may be pregnant.  Any use of cigarettes, alcohol, or street drugs. What are the risks? Generally, this is a safe procedure. However,  problems may occur, including:  Getting too much medicine (oversedation).  Nausea.  Allergic reaction to medicines.  Trouble breathing. If this happens, a breathing tube may be used to help with breathing. It will be removed when you are awake and  breathing on your own.  Heart trouble.  Lung trouble.  Before the procedure Staying hydrated Follow instructions from your health care provider about hydration, which may include:  Up to 2 hours before the procedure - you may continue to drink clear liquids, such as water, clear fruit juice, black coffee, and plain tea.  Eating and drinking restrictions Follow instructions from your health care provider about eating and drinking, which may include:  8 hours before the procedure - stop eating heavy meals or foods such as meat, fried foods, or fatty foods.  6 hours before the procedure - stop eating light meals or foods, such as toast or cereal.  6 hours before the procedure - stop drinking milk or drinks that contain milk.  2 hours before the procedure - stop drinking clear liquids.  Medicines Ask your health care provider about:  Changing or stopping your regular medicines. This is especially important if you are taking diabetes medicines or blood thinners.  Taking medicines such as aspirin and ibuprofen. These medicines can thin your blood. Do not take these medicines before your procedure if your health care provider instructs you not to.  Tests and exams  You will have a physical exam.  You may have blood tests done to show: ? How well your kidneys and liver are working. ? How well your blood can clot.  General instructions  Plan to have someone take you home from the hospital or clinic.  If you will be going home right after the procedure, plan to have someone with you for 24 hours.  What happens during the procedure?  Your blood pressure, heart rate, breathing, level of pain and overall condition will be  monitored.  An IV tube will be inserted into one of your veins.  Your anesthesia specialist will give you medicines as needed to keep you comfortable during the procedure. This may mean changing the level of sedation.  The procedure will be performed. After the procedure  Your blood pressure, heart rate, breathing rate, and blood oxygen level will be monitored until the medicines you were given have worn off.  Do not drive for 24 hours if you received a sedative.  You may: ? Feel sleepy, clumsy, or nauseous. ? Feel forgetful about what happened after the procedure. ? Have a sore throat if you had a breathing tube during the procedure. ? Vomit. This information is not intended to replace advice given to you by your health care provider. Make sure you discuss any questions you have with your health care provider. Document Released: 12/19/2004 Document Revised: 09/01/2015 Document Reviewed: 07/16/2015 Elsevier Interactive Patient Education  2018 [place], Care After These instructions provide you with information about caring for yourself after your procedure. Your health care provider may also give you more specific instructions. Your treatment has been planned according to current medical practices, but problems sometimes occur. Call your health care provider if you have any problems or questions after your procedure. What can I expect after the procedure? After your procedure, it is common to:  Feel sleepy for several hours.  Feel clumsy and have poor balance for several hours.  Feel forgetful about what happened after the procedure.  Have poor judgment for several hours.  Feel nauseous or vomit.  Have a sore throat if you had a breathing tube during the procedure.  Follow these instructions at home: For at least 24 hours after the procedure:   Do not: ? Participate in activities  in which you could fall or become injured. ? Drive. ? Use  heavy machinery. ? Drink alcohol. ? Take sleeping pills or medicines that cause drowsiness. ? Make important decisions or sign legal documents. ? Take care of children on your own.  Rest. Eating and drinking  Follow the diet that is recommended by your health care provider.  If you vomit, drink water, juice, or soup when you can drink without vomiting.  Make sure you have little or no nausea before eating solid foods. General instructions  Have a responsible adult stay with you until you are awake and alert.  Take over-the-counter and prescription medicines only as told by your health care provider.  If you smoke, do not smoke without supervision.  Keep all follow-up visits as told by your health care provider. This is important. Contact a health care provider if:  You keep feeling nauseous or you keep vomiting.  You feel light-headed.  You develop a rash.  You have a fever. Get help right away if:  You have trouble breathing. This information is not intended to replace advice given to you by your health care provider. Make sure you discuss any questions you have with your health care provider. Document Released: 07/16/2015 Document Revised: 11/15/2015 Document Reviewed: 07/16/2015 Elsevier Interactive Patient Education  [employer].

## 2018-01-09 ENCOUNTER — Encounter (HOSPITAL_COMMUNITY)
Admission: RE | Admit: 2018-01-09 | Discharge: 2018-01-09 | Disposition: A | Discharge status: Home / Self Care | Payer: PPO | Source: Ambulatory Visit | Attending: Internal Medicine | Admitting: Internal Medicine

## 2018-01-09 ENCOUNTER — Encounter (HOSPITAL_COMMUNITY): Payer: Self-pay

## 2018-01-09 DIAGNOSIS — Z01818 Encounter for other preprocedural examination: Secondary | ICD-10-CM | POA: Insufficient documentation

## 2018-01-09 HISTORY — DX: Adverse effect of unspecified anesthetic, initial encounter: T41.45XA

## 2018-01-09 HISTORY — DX: Other complications of anesthesia, initial encounter: T88.59XA

## 2018-01-09 LAB — CBC
HEMATOCRIT: 46 % (ref 36.0–46.0)
HEMOGLOBIN: 15.7 g/dL — AB (ref 12.0–15.0)
MCH: 31.3 pg (ref 26.0–34.0)
MCHC: 34.1 g/dL (ref 30.0–36.0)
MCV: 91.6 fL (ref 78.0–100.0)
Platelets: 290 10*3/uL (ref 150–400)
RBC: 5.02 MIL/uL (ref 3.87–5.11)
RDW: 12.5 % (ref 11.5–15.5)
WBC: 11.8 10*3/uL — ABNORMAL HIGH (ref 4.0–10.5)

## 2018-01-09 LAB — BASIC METABOLIC PANEL
Anion gap: 9 (ref 5–15)
BUN: 16 mg/dL (ref 8–23)
CO2: 27 mmol/L (ref 22–32)
Calcium: 9.4 mg/dL (ref 8.9–10.3)
Chloride: 102 mmol/L (ref 98–111)
Creatinine, Ser: 0.56 mg/dL (ref 0.44–1.00)
GFR calc non Af Amer: >60 mL/min (ref >60)
GLUCOSE: 97 mg/dL (ref 70–99)
POTASSIUM: 4.1 mmol/L (ref 3.5–5.1)
Sodium: 138 mmol/L (ref 135–145)

## 2018-01-16 ENCOUNTER — Ambulatory Visit (HOSPITAL_COMMUNITY)
Admission: RE | Admit: 2018-01-16 | Discharge: 2018-01-16 | Disposition: A | Discharge status: Home / Self Care | Payer: PPO | Source: Ambulatory Visit | Attending: Internal Medicine | Admitting: Internal Medicine

## 2018-01-16 ENCOUNTER — Encounter (HOSPITAL_COMMUNITY): Payer: Self-pay | Admitting: *Deleted

## 2018-01-16 ENCOUNTER — Ambulatory Visit (HOSPITAL_COMMUNITY): Payer: PPO | Admitting: Anesthesiology

## 2018-01-16 ENCOUNTER — Encounter (HOSPITAL_COMMUNITY)
Admission: RE | Disposition: A | Discharge status: Home / Self Care | Payer: Self-pay | Source: Ambulatory Visit | Attending: Internal Medicine

## 2018-01-16 DIAGNOSIS — F329 Major depressive disorder, single episode, unspecified: Secondary | ICD-10-CM | POA: Diagnosis not present

## 2018-01-16 DIAGNOSIS — Z87891 Personal history of nicotine dependence: Secondary | ICD-10-CM | POA: Diagnosis not present

## 2018-01-16 DIAGNOSIS — F419 Anxiety disorder, unspecified: Secondary | ICD-10-CM | POA: Diagnosis not present

## 2018-01-16 DIAGNOSIS — Z882 Allergy status to sulfonamides status: Secondary | ICD-10-CM | POA: Insufficient documentation

## 2018-01-16 DIAGNOSIS — E785 Hyperlipidemia, unspecified: Secondary | ICD-10-CM | POA: Diagnosis not present

## 2018-01-16 DIAGNOSIS — Z1211 Encounter for screening for malignant neoplasm of colon: Secondary | ICD-10-CM | POA: Diagnosis not present

## 2018-01-16 DIAGNOSIS — K589 Irritable bowel syndrome without diarrhea: Secondary | ICD-10-CM | POA: Diagnosis not present

## 2018-01-16 DIAGNOSIS — D125 Benign neoplasm of sigmoid colon: Secondary | ICD-10-CM | POA: Diagnosis not present

## 2018-01-16 DIAGNOSIS — G4730 Sleep apnea, unspecified: Secondary | ICD-10-CM | POA: Insufficient documentation

## 2018-01-16 DIAGNOSIS — Z888 Allergy status to other drugs, medicaments and biological substances status: Secondary | ICD-10-CM | POA: Diagnosis not present

## 2018-01-16 DIAGNOSIS — D123 Benign neoplasm of transverse colon: Secondary | ICD-10-CM | POA: Insufficient documentation

## 2018-01-16 DIAGNOSIS — E039 Hypothyroidism, unspecified: Secondary | ICD-10-CM | POA: Insufficient documentation

## 2018-01-16 DIAGNOSIS — Z79899 Other long term (current) drug therapy: Secondary | ICD-10-CM | POA: Diagnosis not present

## 2018-01-16 DIAGNOSIS — Z9989 Dependence on other enabling machines and devices: Secondary | ICD-10-CM | POA: Insufficient documentation

## 2018-01-16 HISTORY — DX: Sleep apnea, unspecified: G47.30

## 2018-01-16 HISTORY — PX: COLONOSCOPY WITH PROPOFOL: SHX5780

## 2018-01-16 HISTORY — PX: POLYPECTOMY: SHX5525

## 2018-01-16 SURGERY — COLONOSCOPY WITH PROPOFOL
Anesthesia: General

## 2018-01-16 MED ORDER — MIDAZOLAM HCL 2 MG/2ML IJ SOLN
INTRAMUSCULAR | Status: AC
  Filled 2018-01-16: qty 2

## 2018-01-16 MED ORDER — EPHEDRINE SULFATE 50 MG/ML IJ SOLN
INTRAMUSCULAR | Status: AC
  Filled 2018-01-16: qty 1

## 2018-01-16 MED ORDER — LACTATED RINGERS IV SOLN
INTRAVENOUS | Status: DC
  Administered 2018-01-16: 07:00:00 via INTRAVENOUS

## 2018-01-16 MED ORDER — MIDAZOLAM HCL 2 MG/2ML IJ SOLN: 0.5000 mg | Freq: Once | INTRAMUSCULAR | Status: DC | PRN

## 2018-01-16 MED ORDER — PROMETHAZINE HCL 25 MG/ML IJ SOLN: 6.2500 mg | INTRAMUSCULAR | Status: DC | PRN

## 2018-01-16 MED ORDER — PROPOFOL 500 MG/50ML IV EMUL
INTRAVENOUS | Status: DC | PRN
  Administered 2018-01-16: 08:00:00 via INTRAVENOUS
  Administered 2018-01-16: 125 ug/kg/min via INTRAVENOUS

## 2018-01-16 MED ORDER — CHLORHEXIDINE GLUCONATE CLOTH 2 % EX PADS: 6.0000 | MEDICATED_PAD | Freq: Once | CUTANEOUS | Status: DC

## 2018-01-16 MED ORDER — SODIUM CHLORIDE 0.9 % IJ SOLN
INTRAMUSCULAR | Status: AC
  Filled 2018-01-16: qty 10

## 2018-01-16 MED ORDER — HYDROMORPHONE HCL 1 MG/ML IJ SOLN: 0.2500 mg | INTRAMUSCULAR | Status: DC | PRN

## 2018-01-16 MED ORDER — PROPOFOL 10 MG/ML IV BOLUS
INTRAVENOUS | Status: AC
  Filled 2018-01-16: qty 20

## 2018-01-16 MED ORDER — MIDAZOLAM HCL 5 MG/5ML IJ SOLN
INTRAMUSCULAR | Status: DC | PRN
  Administered 2018-01-16 (×2): 2 mg via INTRAVENOUS

## 2018-01-16 MED ORDER — HYDROCODONE-ACETAMINOPHEN 7.5-325 MG PO TABS: 1.0000 | ORAL_TABLET | Freq: Once | ORAL | Status: DC | PRN

## 2018-01-16 NOTE — H&P (Signed)
Teresa Galloway is an 62 y.o. female.   Chief Complaint: Patient is here for colonoscopy. HPI: Patient is 61 year old Caucasian female who is here for screening colonoscopy.  She states she has managed to delay for 12 years.  She denies rectal bleeding or melena.  She has history of IBS.  She states dicyclomine is helping.  She has good appetite and has not lost any weight. Family history is negative for CRC.  Past Medical History:  Diagnosis Date  . Acute pericarditis    a. Dx 12/2013.  Marland Kitchen Anxiety   . Complication of anesthesia    effects lasted for 3 days  . Depression   . Dyslipidemia   . Hx of migraines    a. On propranolol.  . Hypothyroid   . Pericardial effusion    a. Dx 12/2013 - moderate without hemodynamic compromise.  . Sleep apnea     Past Surgical History:  Procedure Laterality Date  . BREAST SURGERY    . CERVICAL FUSION    . CHOLECYSTECTOMY    . NASAL SINUS SURGERY    . VAGINAL HYSTERECTOMY      Family History  Problem Relation Age of Onset  . Depression Mother   . Depression Father   . Depression Sister   . Anxiety disorder Sister   . Alcohol abuse Maternal Uncle   . Alcohol abuse Paternal Uncle   . Alcohol abuse Maternal Grandfather   . Depression Maternal Grandmother    Social History:  reports that she quit smoking about 5 years ago. Her smoking use included cigarettes. She started smoking about 42 years ago. She has a 30.00 pack-year smoking history. She has never used smokeless tobacco. She reports that she drinks about 3.0 standard drinks of alcohol per week. She reports that she does not use drugs.  Allergies:  Allergies  Allergen Reactions  . Other Swelling and Other (See Comments)    Walnuts  . Sulfa Antibiotics Shortness Of Breath and Other (See Comments)    Swelling of lips   . Sunscreens Anaphylaxis, Hives and Other (See Comments)    Titanium and zinc oxides Eye swelling   . Seroquel [Quetiapine] Other (See Comments)    Pychosis (foggy,  uncoordinated, slurred speech)  . Tape Hives    Medications Prior to Admission  Medication Sig Dispense Refill  . ALPRAZolam (XANAX) 0.25 MG tablet Take 0.25 mg by mouth 4 (four) times daily as needed for anxiety.     . Artificial Tear Solution (SOOTHE XP OP) Place 1 drop into both eyes 4 (four) times daily.    . Cholecalciferol (VITAMIN D3) 1000 units CAPS Take 1,000 Units by mouth daily.    . citalopram (CELEXA) 40 MG tablet Take 40 mg by mouth daily.     Marland Kitchen dicyclomine (BENTYL) 10 MG capsule Take 1 capsule (10 mg total) by mouth 3 (three) times daily before meals. 90 capsule 4  . Difluprednate (DUREZOL) 0.05 % EMUL Place 1 drop into the left eye daily.    Marland Kitchen EPIPEN 2-PAK 0.3 MG/0.3ML SOAJ injection Inject 0.3 mg into the muscle once.   1  . guaiFENesin (MUCUS RELIEF ADULT PO) Take 1 tablet by mouth 2 (two) times daily.    Marland Kitchen levothyroxine (SYNTHROID, LEVOTHROID) 75 MCG tablet Take 75 mcg by mouth daily before breakfast.    . loratadine (CLARITIN) 10 MG tablet Take 10 mg by mouth daily.    Marland Kitchen omeprazole (PRILOSEC) 20 MG capsule Take 20 mg by mouth daily.    Marland Kitchen  propranolol (INDERAL) 60 MG tablet Take 60 mg by mouth daily.    . rizatriptan (MAXALT) 10 MG tablet Take 10 mg by mouth as needed for migraine. May repeat in 2 hours if needed    . rOPINIRole (REQUIP) 2 MG tablet Take 2 mg by mouth 4 (four) times daily.     Marland Kitchen estradiol (ESTRACE) 1 MG tablet Take 1 tablet (1 mg total) by mouth daily. (Patient not taking: Reported on 01/02/2018) 30 tablet 11    No results found for this or any previous visit (from the past 48 hour(s)). No results found.  ROS  Blood pressure (!) 108/58, pulse 82, temperature 98.9 F (37.2 C), temperature source Oral, resp. rate 18, height 5\' 6"  (1.676 m), weight 64.4 kg, SpO2 97 %. Physical Exam  Constitutional: She appears well-developed and well-nourished.  HENT:  Mouth/Throat: Oropharynx is clear and moist.  Eyes: Conjunctivae are normal. No scleral icterus.   Neck: No thyromegaly present.  Cardiovascular: Normal rate, regular rhythm and normal heart sounds.  No murmur heard. Respiratory: Effort normal and breath sounds normal.  GI:  Abdomen is full.  It is soft with mild tenderness at RLQ.  No organomegaly or masses.  Musculoskeletal: She exhibits no edema.  Lymphadenopathy:    She has no cervical adenopathy.  Neurological: She is alert.  Skin: Skin is warm and dry.     Assessment/Plan Average risk screening colonoscopy.  Hildred Laser, MD 01/16/2018, 7:26 AM

## 2018-01-16 NOTE — Anesthesia Preprocedure Evaluation (Signed)
Anesthesia Evaluation  Patient identified by MRN, date of birth, ID band Patient awake    Reviewed: Allergy & Precautions, NPO status , Patient's Chart, lab work & pertinent test results  History of Anesthesia Complications (+) history of anesthetic complications  Airway Mallampati: II  TM Distance: >3 FB Neck ROM: Full    Dental no notable dental hx. (+) Teeth Intact   Pulmonary sleep apnea and Continuous Positive Airway Pressure Ventilation , former smoker,    Pulmonary exam normal breath sounds clear to auscultation       Cardiovascular Exercise Tolerance: Good negative cardio ROS Normal cardiovascular examI Rhythm:Regular Rate:Normal     Neuro/Psych Anxiety Depression negative neurological ROS  negative psych ROS   GI/Hepatic Neg liver ROS, GERD  Medicated and Controlled,  Endo/Other  Hypothyroidism   Renal/GU negative Renal ROS  negative genitourinary   Musculoskeletal negative musculoskeletal ROS (+)   Abdominal   Peds negative pediatric ROS (+)  Hematology negative hematology ROS (+)   Anesthesia Other Findings   Reproductive/Obstetrics negative OB ROS                             Anesthesia Physical Anesthesia Plan  ASA: II  Anesthesia Plan: General   Post-op Pain Management:    Induction: Intravenous  PONV Risk Score and Plan:   Airway Management Planned: Nasal Cannula and Simple Face Mask  Additional Equipment:   Intra-op Plan:   Post-operative Plan:   Informed Consent: I have reviewed the patients History and Physical, chart, labs and discussed the procedure including the risks, benefits and alternatives for the proposed anesthesia with the patient or authorized representative who has indicated his/her understanding and acceptance.   Dental advisory given  Plan Discussed with: CRNA  Anesthesia Plan Comments:         Anesthesia Quick Evaluation

## 2018-01-16 NOTE — Discharge Instructions (Signed)
No aspirin or NSAIDs for 24 hours. Resume usual medications and diet as before. No driving for 24 hours. Physician will call with biopsy results and further recommendations.        Colonoscopy, Adult, Care After This sheet gives you information about how to care for yourself after your procedure. Your doctor may also give you more specific instructions. If you have problems or questions, call your doctor. Follow these instructions at home: General instructions   For the first 24 hours after the procedure: ? Do not drive or use machinery. ? Do not sign important documents. ? Do not drink alcohol. ? Do your daily activities more slowly than normal. ? Eat foods that are soft and easy to digest. ? Rest often.  Take over-the-counter or prescription medicines only as told by your doctor.  It is up to you to get the results of your procedure. Ask your doctor, or the department performing the procedure, when your results will be ready. To help cramping and bloating:  Try walking around.  Put heat on your belly (abdomen) as told by your doctor. Use a heat source that your doctor recommends, such as a moist heat pack or a heating pad. ? Put a towel between your skin and the heat source. ? Leave the heat on for 20-30 minutes. ? Remove the heat if your skin turns bright red. This is especially important if you cannot feel pain, heat, or cold. You can get burned. Eating and drinking  Drink enough fluid to keep your pee (urine) clear or pale yellow.  Return to your normal diet as told by your doctor. Avoid heavy or fried foods that are hard to digest.  Avoid drinking alcohol for as long as told by your doctor. Contact a doctor if:  You have blood in your poop (stool) 2-3 days after the procedure. Get help right away if:  You have more than a small amount of blood in your poop.  You see large clumps of tissue (blood clots) in your poop.  Your belly is swollen.  You feel sick to  your stomach (nauseous).  You throw up (vomit).  You have a fever.  You have belly pain that gets worse, and medicine does not help your pain. This information is not intended to replace advice given to you by your health care provider. Make sure you discuss any questions you have with your health care provider. Document Released: 04/27/2010 Document Revised: 12/18/2015 Document Reviewed: 12/18/2015 Elsevier Interactive Patient Education  2017 [place], Care After These instructions provide you with information about caring for yourself after your procedure. Your health care provider may also give you more specific instructions. Your treatment has been planned according to current medical practices, but problems sometimes occur. Call your health care provider if you have any problems or questions after your procedure. What can I expect after the procedure? After your procedure, it is common to:  Feel sleepy for several hours.  Feel clumsy and have poor balance for several hours.  Feel forgetful about what happened after the procedure.  Have poor judgment for several hours.  Feel nauseous or vomit.  Have a sore throat if you had a breathing tube during the procedure.  Follow these instructions at home: For at least 24 hours after the procedure:   Do not: ? Participate in activities in which you could fall or become injured. ? Drive. ? Use heavy machinery. ? Drink alcohol. ? Take  sleeping pills or medicines that cause drowsiness. ? Make important decisions or sign legal documents. ? Take care of children on your own.  Rest. Eating and drinking  Follow the diet that is recommended by your health care provider.  If you vomit, drink water, juice, or soup when you can drink without vomiting.  Make sure you have little or no nausea before eating solid foods. General instructions  Have a responsible adult stay with you until you are  awake and alert.  Take over-the-counter and prescription medicines only as told by your health care provider.  If you smoke, do not smoke without supervision.  Keep all follow-up visits as told by your health care provider. This is important. Contact a health care provider if:  You keep feeling nauseous or you keep vomiting.  You feel light-headed.  You develop a rash.  You have a fever. Get help right away if:  You have trouble breathing. This information is not intended to replace advice given to you by your health care provider. Make sure you discuss any questions you have with your health care provider. Document Released: 07/16/2015 Document Revised: 11/15/2015 Document Reviewed: 07/16/2015 Elsevier Interactive Patient Education  [employer].

## 2018-01-16 NOTE — Transfer of Care (Signed)
Immediate Anesthesia Transfer of Care Note  Patient: Teresa Galloway  Procedure(s) Performed: COLONOSCOPY WITH PROPOFOL (N/A ) POLYPECTOMY  Patient Location: PACU  Anesthesia Type:MAC  Level of Consciousness: awake and patient cooperative  Airway & Oxygen Therapy: Patient Spontanous Breathing and Patient connected to nasal cannula oxygen  Post-op Assessment: Report given to RN, Post -op Vital signs reviewed and stable and Patient moving all extremities  Post vital signs: Reviewed and stable  Last Vitals:  Vitals Value Taken Time  BP    Temp    Pulse 75 01/16/2018  8:21 AM  Resp    SpO2 95 % 01/16/2018  8:21 AM  Vitals shown include unvalidated device data.  Last Pain:  Vitals:   01/16/18 0734  TempSrc:   PainSc: 0-No pain      Patients Stated Pain Goal: 5 (24/46/28 6381)  Complications: No apparent anesthesia complications

## 2018-01-16 NOTE — Op Note (Signed)
[facility] Patient Name: Teresa Galloway Procedure Date: 01/16/2018 7:32 AM MRN: [id number] Date of Birth: 05-Mar-1956 Attending MD: Hildred Laser , MD CSN: [id number] Age: 62 Admit Type: Outpatient Procedure:                Colonoscopy Indications:              Screening for colorectal malignant neoplasm Providers:                Hildred Laser, MD, Otis Peak B. Sharon Seller, RN, Nelma Rothman, Technician Referring MD:             Glenda Chroman, MD Medicines:                Propofol per Anesthesia Complications:            No immediate complications. Estimated Blood Loss:     Estimated blood loss was minimal. Procedure:                Pre-Anesthesia Assessment:                           - Prior to the procedure, a History and Physical                            was performed, and patient medications and                            allergies were reviewed. The patient's tolerance of                            previous anesthesia was also reviewed. The risks                            and benefits of the procedure and the sedation                            options and risks were discussed with the patient.                            All questions were answered, and informed consent                            was obtained. Prior Anticoagulants: The patient has                            taken no previous anticoagulant or antiplatelet                            agents. ASA Grade Assessment: II - A patient with                            mild systemic disease. After reviewing the risks  and benefits, the patient was deemed in                            satisfactory condition to undergo the procedure.                           After obtaining informed consent, the colonoscope                            was passed under direct vision. Throughout the                            procedure, the patient's blood pressure, pulse, and         oxygen saturations were monitored continuously. The                            [id number]-[id number] ([id number]) was introduced through the and                            advanced to the the cecum, identified by                            appendiceal orifice and ileocecal valve. The                            colonoscopy was performed without difficulty. The                            patient tolerated the procedure well. The quality                            of the bowel preparation was adequate. The                            ileocecal valve, appendiceal orifice, and rectum                            were photographed. Scope In: 7:54:29 AM Scope Out: 8:13:32 AM Scope Withdrawal Time: 0 hours 11 minutes 40 seconds  Total Procedure Duration: 0 hours 19 minutes 3 seconds  Findings:      The perianal and digital rectal examinations were normal.      Two sessile polyps were found in the splenic flexure. The polyps were       small in size. These were biopsied with a cold forceps for histology.       The pathology specimen was placed into Bottle Number 1.      A 5 mm polyp was found in the proximal sigmoid colon. The polyp was       sessile. The polyp was removed with a cold snare. Resection and       retrieval were complete. The pathology specimen was placed into Bottle       Number 1.      The retroflexed view of the distal rectum and anal verge was normal and       showed no anal or rectal abnormalities.  Impression:               - Two small polyps at the splenic flexure. Biopsied.                           - One 5 mm polyp in the proximal sigmoid colon,                            removed with a cold snare. Resected and retrieved. Moderate Sedation:      Per Anesthesia Care Recommendation:           - Patient has a contact number available for                            emergencies. The signs and symptoms of potential                            delayed complications were discussed with the                             patient. Return to normal activities tomorrow.                            Written discharge instructions were provided to the                            patient.                           - Resume previous diet today.                           - Continue present medications.                           - No aspirin, ibuprofen, naproxen, or other                            non-steroidal anti-inflammatory drugs for 1 day.                           - Await pathology results.                           - Repeat colonoscopy is recommended. The                            colonoscopy date will be determined after pathology                            results from today's exam become available for                            review. Procedure Code(s):        --- Professional ---  45385, Colonoscopy, flexible; with removal of                            tumor(s), polyp(s), or other lesion(s) by snare                            technique                           45380, 59, Colonoscopy, flexible; with biopsy,                            single or multiple Diagnosis Code(s):        --- Professional ---                           Z12.11, Encounter for screening for malignant                            neoplasm of colon                           D12.3, Benign neoplasm of transverse colon (hepatic                            flexure or splenic flexure)                           D12.5, Benign neoplasm of sigmoid colon CPT copyright 2018 [employer]. All rights reserved. The codes documented in this report are preliminary and upon coder review may  be revised to meet current compliance requirements. Hildred Laser, MD Hildred Laser, MD 01/16/2018 8:21:28 AM This report has been signed electronically. Number of Addenda: 0

## 2018-01-16 NOTE — Anesthesia Postprocedure Evaluation (Signed)
Anesthesia Post Note  Patient: Teresa Galloway  Procedure(s) Performed: COLONOSCOPY WITH PROPOFOL (N/A ) POLYPECTOMY  Patient location during evaluation: PACU Anesthesia Type: General Level of consciousness: awake and patient cooperative Pain management: pain level controlled Vital Signs Assessment: post-procedure vital signs reviewed and stable Respiratory status: spontaneous breathing, nonlabored ventilation and respiratory function stable Cardiovascular status: blood pressure returned to baseline Postop Assessment: no apparent nausea or vomiting Anesthetic complications: no     Last Vitals:  Vitals:   01/16/18 0637  BP: (!) 108/58  Pulse: 82  Resp: 18  Temp: 37.2 C  SpO2: 97%    Last Pain:  Vitals:   01/16/18 0734  TempSrc:   PainSc: 0-No pain                 Rolena Knutson J

## 2018-01-20 ENCOUNTER — Telehealth (INDEPENDENT_AMBULATORY_CARE_PROVIDER_SITE_OTHER): Payer: Self-pay | Admitting: *Deleted

## 2018-01-20 NOTE — Telephone Encounter (Signed)
Patient was called and given the results of the pathology done at the time of her TCS. Tubular Adenoma without high grad dysphagia and Sessile Serrated Polyp without Cytologic Dysphagia . She was advised there was no malignancy , Dr.Rehman will let us know when he would like to repeat the TCS, ex: 5 , 7 10 years.

## 2018-01-22 ENCOUNTER — Encounter (HOSPITAL_COMMUNITY): Payer: Self-pay | Admitting: Internal Medicine

## 2018-02-17 DIAGNOSIS — M2550 Pain in unspecified joint: Secondary | ICD-10-CM | POA: Diagnosis not present

## 2018-02-17 DIAGNOSIS — K589 Irritable bowel syndrome without diarrhea: Secondary | ICD-10-CM | POA: Diagnosis not present

## 2018-02-17 DIAGNOSIS — Z299 Encounter for prophylactic measures, unspecified: Secondary | ICD-10-CM | POA: Diagnosis not present

## 2018-02-17 DIAGNOSIS — Z6823 Body mass index (BMI) 23.0-23.9, adult: Secondary | ICD-10-CM | POA: Diagnosis not present

## 2018-02-17 DIAGNOSIS — G4731 Primary central sleep apnea: Secondary | ICD-10-CM | POA: Diagnosis not present

## 2018-02-18 DIAGNOSIS — Z6823 Body mass index (BMI) 23.0-23.9, adult: Secondary | ICD-10-CM | POA: Diagnosis not present

## 2018-02-18 DIAGNOSIS — R030 Elevated blood-pressure reading, without diagnosis of hypertension: Secondary | ICD-10-CM | POA: Diagnosis not present

## 2018-02-18 DIAGNOSIS — M7062 Trochanteric bursitis, left hip: Secondary | ICD-10-CM | POA: Diagnosis not present

## 2018-02-18 DIAGNOSIS — M1710 Unilateral primary osteoarthritis, unspecified knee: Secondary | ICD-10-CM | POA: Diagnosis not present

## 2018-02-18 DIAGNOSIS — R197 Diarrhea, unspecified: Secondary | ICD-10-CM | POA: Diagnosis not present

## 2018-02-18 DIAGNOSIS — M7061 Trochanteric bursitis, right hip: Secondary | ICD-10-CM | POA: Diagnosis not present

## 2018-02-18 DIAGNOSIS — Z299 Encounter for prophylactic measures, unspecified: Secondary | ICD-10-CM | POA: Diagnosis not present

## 2018-02-27 DIAGNOSIS — G4730 Sleep apnea, unspecified: Secondary | ICD-10-CM | POA: Diagnosis not present

## 2018-02-27 DIAGNOSIS — Z87891 Personal history of nicotine dependence: Secondary | ICD-10-CM | POA: Diagnosis not present

## 2018-02-27 DIAGNOSIS — Z6823 Body mass index (BMI) 23.0-23.9, adult: Secondary | ICD-10-CM | POA: Diagnosis not present

## 2018-02-27 DIAGNOSIS — Z299 Encounter for prophylactic measures, unspecified: Secondary | ICD-10-CM | POA: Diagnosis not present

## 2018-02-27 DIAGNOSIS — R21 Rash and other nonspecific skin eruption: Secondary | ICD-10-CM | POA: Diagnosis not present

## 2018-04-15 DIAGNOSIS — G2581 Restless legs syndrome: Secondary | ICD-10-CM | POA: Diagnosis not present

## 2018-04-15 DIAGNOSIS — Z6823 Body mass index (BMI) 23.0-23.9, adult: Secondary | ICD-10-CM | POA: Diagnosis not present

## 2018-04-15 DIAGNOSIS — Z299 Encounter for prophylactic measures, unspecified: Secondary | ICD-10-CM | POA: Diagnosis not present

## 2018-04-15 DIAGNOSIS — Z87891 Personal history of nicotine dependence: Secondary | ICD-10-CM | POA: Diagnosis not present

## 2018-04-15 DIAGNOSIS — R030 Elevated blood-pressure reading, without diagnosis of hypertension: Secondary | ICD-10-CM | POA: Diagnosis not present

## 2018-04-15 DIAGNOSIS — E559 Vitamin D deficiency, unspecified: Secondary | ICD-10-CM | POA: Diagnosis not present

## 2018-04-15 DIAGNOSIS — E039 Hypothyroidism, unspecified: Secondary | ICD-10-CM | POA: Diagnosis not present

## 2018-04-17 DIAGNOSIS — H04123 Dry eye syndrome of bilateral lacrimal glands: Secondary | ICD-10-CM | POA: Diagnosis not present

## 2018-04-17 DIAGNOSIS — H1013 Acute atopic conjunctivitis, bilateral: Secondary | ICD-10-CM | POA: Diagnosis not present

## 2018-04-17 DIAGNOSIS — H02831 Dermatochalasis of right upper eyelid: Secondary | ICD-10-CM | POA: Diagnosis not present

## 2018-04-17 DIAGNOSIS — H02889 Meibomian gland dysfunction of unspecified eye, unspecified eyelid: Secondary | ICD-10-CM | POA: Diagnosis not present

## 2018-04-22 ENCOUNTER — Institutional Professional Consult (permissible substitution): Payer: PPO | Admitting: Neurology

## 2018-04-22 ENCOUNTER — Encounter

## 2018-04-27 ENCOUNTER — Ambulatory Visit: Payer: PPO | Admitting: Neurology

## 2018-04-27 ENCOUNTER — Encounter: Payer: Self-pay | Admitting: Neurology

## 2018-04-27 VITALS — BP 129/73 | HR 72 | Ht 66.0 in | Wt 148.0 lb

## 2018-04-27 DIAGNOSIS — G2581 Restless legs syndrome: Secondary | ICD-10-CM | POA: Diagnosis not present

## 2018-04-27 DIAGNOSIS — G4733 Obstructive sleep apnea (adult) (pediatric): Secondary | ICD-10-CM | POA: Diagnosis not present

## 2018-04-27 DIAGNOSIS — G4731 Primary central sleep apnea: Secondary | ICD-10-CM | POA: Diagnosis not present

## 2018-04-27 NOTE — Patient Instructions (Addendum)
You are on high dose requip for your restless legs, I would not recommend going higher, as you are already be experiencing what we call augmentation of your restless legs symptoms, that is accelerated symptoms, affecting your upper body and sooner in the day, than just at night. Requip can cause swelling of the legs as well.   Please consider, that celexa and SSRI type antidepressants and similar antidepressants can also exacerbate restless legs symptoms.   You may benefit from trying a BiPAP machine, you would need another sleep study and a BiPAP titration.   You can consider a referral to ENT for consideration of the Inspire implantable device.   Please follow up with Dr. Ron Parker for sleep apnea management.

## 2018-04-27 NOTE — Progress Notes (Signed)
Subjective:    Patient ID: Teresa Galloway is a 63 y.o. female.  HPI     Star Age, MD, PhD [facility] [address], [address] [place], [place] [address]   Dear Rennis Petty,   I saw your patient, Teresa Galloway, upon your kind request in my sleep clinic today for initial consultation of her sleep disorder, in particular, reevaluation of her prior diagnosis of sleep apnea including central sleep apnea and history of restless leg syndrome. The patient is accompanied by her mother today. As you know, Teresa Galloway is a 63 year old right-handed woman with an underlying medical history of reflux disease, anxiety, hypothyroidism, history of pericardial effusion, history of migraine headaches, depression, hyperlipidemia, and irritable bowel syndrome, who reports previously trying CPAP and BiPAP for obstructive and central sleep apnea. She has also had restless leg symptoms for years, she has been on Requip per psychiatrist, currently 2 mg 4 times a day. She reports residual restless leg symptoms but better since she has been using her oral appliance. Prior sleep study reports were reviewed today she had a recent home sleep test on 04/21/2018 which indicated an AHI of 18.3. Her obstructive events were reduced and she had central apneas. She has been using the dental device through Dr. Ron Parker. A home sleep test from 10/25/2015 showed an AHI of 39.9 per hour, this was without the oral appliance. Central apnea index was 14.9 at the time. She had a recent attended sleep study in [place] on 01/06/2018, this was a CPAP titration study. She was titrated from 5 cm to 14 cm of water pressure. PLMS index was 14.8 that time with an associated arousal index of 3.7. Patient was advised to proceed with AutoPap therapy. I also reviewed your office note from 02/17/2018, which you kindly included. She had work last year on 12/05/2017 and I reviewed the results: CBC with differential was  unremarkable with the exception of slightly elevated lymphocytes, TSH was 0.63, lipid panel showed mildly abnormal findings with a cholesterol level of 214, triglycerides 195, HDL 39 and LDL 136. CMP was unremarkable with the exception of borderline glucose level at 101. She reports that she had to increase her Requip with time. She reports symptoms are throughout the day, therefore she takes the Requip throughout the day. She is not willing to consider another sleep study are trying CPAP or BiPAP at this time. She is wondering if she could be a candidate for the [facility] procedure. Her current bedtime is around 10 to as late as midnight, rise time at 7:30 typically. She does have some mouth dryness. She reports a family history of restless leg syndrome. Epworth Sleepiness Scale score is 11 out of 24, fatigue score is 58 out of 63. She is single and lives alone, no children. She quit smoking in 2014, drinks alcohol about twice a week and caffeine in the form of coffee, one cup per day on average.  Her Past Medical History Is Significant For: Past Medical History:  Diagnosis Date  . Acute pericarditis    a. Dx 12/2013.  Marland Kitchen Anxiety   . Complication of anesthesia    effects lasted for 3 days  . Depression   . Dyslipidemia   . Hx of migraines    a. On propranolol.  . Hypothyroid   . Pericardial effusion    a. Dx 12/2013 - moderate without hemodynamic compromise.  . Sleep apnea     Her Past Surgical History Is Significant For:  Past Surgical History:  Procedure Laterality Date  . BREAST SURGERY    . CERVICAL FUSION    . CHOLECYSTECTOMY    . COLONOSCOPY WITH PROPOFOL N/A 01/16/2018   Procedure: COLONOSCOPY WITH PROPOFOL;  Surgeon: Rogene Houston, MD;  [facility];  Service: Endoscopy;  Laterality: N/A;  10:55  . NASAL SINUS SURGERY    . POLYPECTOMY  01/16/2018   Procedure: POLYPECTOMY;  Surgeon: Rogene Houston, MD;  Location: [facility];  Service: Endoscopy;;  colon  . VAGINAL  HYSTERECTOMY      Her Family History Is Significant For: Family History  Problem Relation Age of Onset  . Depression Mother   . Depression Father   . Depression Sister   . Anxiety disorder Sister   . Alcohol abuse Maternal Uncle   . Alcohol abuse Paternal Uncle   . Alcohol abuse Maternal Grandfather   . Depression Maternal Grandmother     Her Social History Is Significant For: Social History   Socioeconomic History  . Marital status: Married    Spouse name: Not on file  . Number of children: Not on file  . Years of education: Not on file  . Highest education level: Not on file  Occupational History  . Not on file  Social Needs  . Financial resource strain: Not on file  . Food insecurity:    Worry: Not on file    Inability: Not on file  . Transportation needs:    Medical: Not on file    Non-medical: Not on file  Tobacco Use  . Smoking status: Former Smoker    Packs/day: 1.00    Years: 30.00    Pack years: 30.00    Types: Cigarettes    Start date: 05/13/1975    Last attempt to quit: 07/07/2012    Years since quitting: 5.8  . Smokeless tobacco: Never Used  . Tobacco comment: Quit 1 year ago  Substance and Sexual Activity  . Alcohol use: Yes    Alcohol/week: 3.0 standard drinks    Types: 3 Glasses of wine per week  . Drug use: No  . Sexual activity: Not Currently    Partners: Male    Birth control/protection: Surgical  Lifestyle  . Physical activity:    Days per week: Not on file    Minutes per session: Not on file  . Stress: Not on file  Relationships  . Social connections:    Talks on phone: Not on file    Gets together: Not on file    Attends religious service: Not on file    Active member of club or organization: Not on file    Attends meetings of clubs or organizations: Not on file    Relationship status: Not on file  Other Topics Concern  . Not on file  Social History Narrative   She lives alone.     Her Allergies Are:  Allergies  Allergen  Reactions  . Other Anaphylaxis, Swelling and Other (See Comments)    Walnuts  . Sulfa Antibiotics Shortness Of Breath and Other (See Comments)    Swelling of lips   . Sunscreens Anaphylaxis, Hives and Other (See Comments)    Titanium and zinc oxides Eye swelling   . Seroquel [Quetiapine] Other (See Comments)    Pychosis (foggy, uncoordinated, slurred speech)  . Tape Hives  :   Her Current Medications Are:  Outpatient Encounter Medications as of 04/27/2018  Medication Sig  . ALPRAZolam (XANAX) 0.25 MG tablet  Take 0.25 mg by mouth 4 (four) times daily as needed for anxiety.   . cetirizine (ZYRTEC) 10 MG tablet Take 10 mg by mouth daily.  . Cholecalciferol (VITAMIN D3) 1000 units CAPS Take 1,000 Units by mouth daily.  . citalopram (CELEXA) 40 MG tablet Take 40 mg by mouth daily.   Marland Kitchen dicyclomine (BENTYL) 10 MG capsule Take 1 capsule (10 mg total) by mouth 3 (three) times daily before meals.  Marland Kitchen EPIPEN 2-PAK 0.3 MG/0.3ML SOAJ injection Inject 0.3 mg into the muscle once.   . Eyelid Cleansers (AVENOVA) 0.01 % SOLN Apply topically.  Marland Kitchen guaiFENesin (MUCUS RELIEF ADULT PO) Take 1 tablet by mouth 2 (two) times daily.  Marland Kitchen levothyroxine (SYNTHROID, LEVOTHROID) 75 MCG tablet Take 75 mcg by mouth daily before breakfast.  . olopatadine (PATANOL) 0.1 % ophthalmic solution 1 drop 2 (two) times daily.  Marland Kitchen omeprazole (PRILOSEC) 20 MG capsule Take 20 mg by mouth daily.  . propranolol (INDERAL) 60 MG tablet Take 60 mg by mouth daily.  . rizatriptan (MAXALT) 10 MG tablet Take 10 mg by mouth as needed for migraine. May repeat in 2 hours if needed  . rOPINIRole (REQUIP) 2 MG tablet Take 2 mg by mouth 4 (four) times daily.   . [DISCONTINUED] Artificial Tear Solution (SOOTHE XP OP) Place 1 drop into both eyes 4 (four) times daily.  . [DISCONTINUED] Difluprednate (DUREZOL) 0.05 % EMUL Place 1 drop into the left eye daily.  . [DISCONTINUED] loratadine (CLARITIN) 10 MG tablet Take 10 mg by mouth daily.   No  facility-administered encounter medications on file as of 04/27/2018.   :  Review of Systems:  Out of a complete 14 point review of systems, all are reviewed and negative with the exception of these symptoms as listed below: Review of Systems  Neurological:       Pt presents today to discuss her sleep. Pt has had several sleep studies. Pt could not tolerate cpap. Pt wants to discuss her RLS as well. Pt uses an oral appliance at night. Pt does snore.  Epworth Sleepiness Scale 0= would never doze 1= slight chance of dozing 2= moderate chance of dozing 3= high chance of dozing  Sitting and reading: 3 Watching TV: 3 Sitting inactive in a public place (ex. Theater or meeting): 1 As a passenger in a car for an hour without a break: 0 Lying down to rest in the afternoon: 3 Sitting and talking to someone: 0 Sitting quietly after lunch (no alcohol): 1-2 In a car, while stopped in traffic: 0 Total: 11-12     Objective:  Neurological Exam  Physical Exam Physical Examination:   Vitals:   04/27/18 1114  BP: 129/73  Pulse: 72    General Examination: The patient is a very pleasant 63 y.o. female in no acute distress. She appears well-developed and well-nourished and well groomed.   HEENT: Normocephalic, atraumatic, pupils are equal, round and reactive to light and accommodation. Extraocular tracking is good without limitation to gaze excursion or nystagmus noted. Normal smooth pursuit is noted. Hearing is grossly intact. Face is symmetric with normal facial animation and normal facial sensation. Speech is clear with no dysarthria noted. There is no hypophonia. There is no lip, neck/head, jaw or voice tremor. Neck shows FROM. Oropharynx exam reveals: mild mouth dryness, adequate dental hygiene and mild airway crowding, neck slender.   Chest: Clear to auscultation without wheezing, rhonchi or crackles noted.  Heart: S1+S2+0, regular and normal without murmurs, rubs or gallops noted.  Abdomen: Soft, non-tender and non-distended with normal bowel sounds appreciated on auscultation.  Extremities: There is no obvious edema.  Skin: Warm and dry without trophic changes noted.  Musculoskeletal: exam reveals no obvious joint deformities, tenderness or joint swelling or erythema.   Neurologically:  Mental status: The patient is awake, alert and oriented in all 4 spheres. Her immediate and remote memory, attention, language skills and fund of knowledge are appropriate. There is no evidence of aphasia, agnosia, apraxia or anomia. Speech is clear with normal prosody and enunciation. Thought process is linear. Mood is normal and affect is normal.  Cranial nerves II - XII are as described above under HEENT exam.  Motor exam: Normal bulk, strength and tone is noted. There is no tremor. Fine motor skills and coordination: grossly intact.  Cerebellar testing: No dysmetria or intention tremor.  Sensory exam: intact to light touch in the upper and lower extremities.  Gait, station and balance: She stands easily. No veering to one side is noted. No leaning to one side is noted. Posture is age-appropriate and stance is narrow based. Gait shows normal stride length and normal pace. No problems turning are noted.                Assessment and Plan:  In summary, Teresa Galloway is a very pleasant 64 y.o.-year old female with an underlying medical history of reflux disease, anxiety, hypothyroidism, history of pericardial effusion, history of migraine headaches, depression, hyperlipidemia, and irritable bowel syndrome, who presents for evaluation of her sleep disorder including sleep apnea with an obstructive and central component of restless leg syndrome. She has been on an oral appliance for obstructive sleep apnea treatment. She has some residual central sleep apnea but would not want to come in for a sleep study or consider BiPAP therapy. She is advised to continue follow-up with Dr. Ron Parker for her  obstructive sleep apnea. For her restless leg syndrome she is on a rather high dose of ropinirole and I would not favor increasing this. In fact, I would recommend that she consider reducing it gradually with your help. I would not recommend a total dose of more than 4 mg, she is currently on 8 mg a may be experiencing already what is known as augmentation because of the higher dose. She reports that her restless leg syndrome type symptoms are under reasonable control currently so long as she also uses oral appliance. She is encouraged to also talk with her psychiatrist about potential changes in her antidepressant medication as the Celexa can contribute to restless leg symptoms. However, she does report good results with her Celexa so there may not be a reason to change her antidepressant medication, nevertheless, I would recommend that she consider reducing the ropinirole. Unfortunately, there is not a whole lot I can help with at this time, she is adamant that she will not consider another sleep study and that she will not be able to use CPAP or BiPAP for that matter. She is wondering if she could be a candidate for the [facility] implantable device. She is advised to consider a referral to ENT and discuss this matter with you. As far as I know, it is not approved for treatment for central apnea and therefore may not help her more than the oral appliance has helped her. Nevertheless, she is going to look into an ENT referral. She will follow-up with you. I answered all her questions today and she was in agreement. Thank you very much for  allowing me to participate in the care of this nice patient. If I can be of any further assistance to you please do not hesitate to call me at [phone].  Sincerely,   Star Age, MD, PhD

## 2018-04-30 DIAGNOSIS — Z299 Encounter for prophylactic measures, unspecified: Secondary | ICD-10-CM | POA: Diagnosis not present

## 2018-04-30 DIAGNOSIS — K219 Gastro-esophageal reflux disease without esophagitis: Secondary | ICD-10-CM | POA: Diagnosis not present

## 2018-04-30 DIAGNOSIS — J329 Chronic sinusitis, unspecified: Secondary | ICD-10-CM | POA: Diagnosis not present

## 2018-04-30 DIAGNOSIS — E039 Hypothyroidism, unspecified: Secondary | ICD-10-CM | POA: Diagnosis not present

## 2018-04-30 DIAGNOSIS — Z87891 Personal history of nicotine dependence: Secondary | ICD-10-CM | POA: Diagnosis not present

## 2018-04-30 DIAGNOSIS — Z6823 Body mass index (BMI) 23.0-23.9, adult: Secondary | ICD-10-CM | POA: Diagnosis not present

## 2018-05-02 DIAGNOSIS — R410 Disorientation, unspecified: Secondary | ICD-10-CM | POA: Diagnosis not present

## 2018-05-02 DIAGNOSIS — F419 Anxiety disorder, unspecified: Secondary | ICD-10-CM | POA: Diagnosis not present

## 2018-05-02 DIAGNOSIS — R030 Elevated blood-pressure reading, without diagnosis of hypertension: Secondary | ICD-10-CM | POA: Diagnosis not present

## 2018-05-02 DIAGNOSIS — R42 Dizziness and giddiness: Secondary | ICD-10-CM | POA: Diagnosis not present

## 2018-05-02 DIAGNOSIS — T44995A Adverse effect of other drug primarily affecting the autonomic nervous system, initial encounter: Secondary | ICD-10-CM | POA: Diagnosis not present

## 2018-05-06 DIAGNOSIS — N390 Urinary tract infection, site not specified: Secondary | ICD-10-CM | POA: Diagnosis not present

## 2018-05-06 DIAGNOSIS — R350 Frequency of micturition: Secondary | ICD-10-CM | POA: Diagnosis not present

## 2018-05-06 DIAGNOSIS — Z299 Encounter for prophylactic measures, unspecified: Secondary | ICD-10-CM | POA: Diagnosis not present

## 2018-05-06 DIAGNOSIS — T50905A Adverse effect of unspecified drugs, medicaments and biological substances, initial encounter: Secondary | ICD-10-CM | POA: Diagnosis not present

## 2018-05-06 DIAGNOSIS — Z6823 Body mass index (BMI) 23.0-23.9, adult: Secondary | ICD-10-CM | POA: Diagnosis not present

## 2018-05-07 ENCOUNTER — Other Ambulatory Visit: Payer: Self-pay | Admitting: Pharmacy Technician

## 2018-05-07 ENCOUNTER — Other Ambulatory Visit: Payer: Self-pay

## 2018-05-07 NOTE — Patient Outreach (Signed)
[place] [facility]) Care Management  05/07/2018  Teresa Galloway Aug 29, 1955 [id number]                                                  Medication Assistance Referral  Referral From: [place]  Medication/Company: [name] / [employer] Patient application portion:  Mailed Provider application portion: Faxed  to Dr. Marin Comment  Follow up:  Will follow up with patient in 5-7 business days to confirm application(s) have been received.  Maud Deed Chana Bode [place] Certified Pharmacy Technician [place] Management Direct Dial:[phone]

## 2018-05-07 NOTE — Patient Outreach (Addendum)
[place] [facility]) Care Management  [place]   05/07/2018  TALISSA APPLE 1956/01/01 [id number]  Reason for referral: Medication Assistance with [name] and Patanol  Referral source: [facility] RN Current insurance:[employer]  PMHx includes but not limited to:  Irritable bowel syndrome, hypothyroid, depression, migraines and dyslipidemia  Outreach:  Successful telephone call with Ms. Chalfin.  HIPAA identifiers verified.   Subjective:  Ms. Mabe reports that she is having difficulty affording her Patanol generic ($45/month) and her [name] ($90/month).  She states that she has very dry eyes and had been on Restasis, but it did not work as well as the [employer].    Objective: Lab Results  Component Value Date   CREATININE 0.56 01/09/2018   CREATININE 0.69 03/01/2014    No results found for: HGBA1C  Lipid Panel  No results found for: CHOL, TRIG, HDL, CHOLHDL, VLDL, LDLCALC, LDLDIRECT  BP Readings from Last 3 Encounters:  04/27/18 129/73  01/16/18 133/63  01/09/18 135/61    Allergies  Allergen Reactions  . Other Anaphylaxis, Swelling and Other (See Comments)    Walnuts  . Sulfa Antibiotics Shortness Of Breath and Other (See Comments)    Swelling of lips   . Sunscreens Anaphylaxis, Hives and Other (See Comments)    Titanium and zinc oxides Eye swelling   . Seroquel [Quetiapine] Other (See Comments)    Pychosis (foggy, uncoordinated, slurred speech)  . Tape Hives    Medications Reviewed Today    Reviewed by Dionne Milo, [facility] (Pharmacist) on 05/07/18 at 1223  Med List Status: <None>  Medication Order Taking? Sig Documenting Provider Last Dose Status Informant  ALPRAZolam (XANAX) 0.25 MG tablet [id number] Yes Take 0.25 mg by mouth 4 (four) times daily as needed for anxiety.  [provider] Taking Active Self           Med Note Kenton Kingfisher, Germaine Pomfret Dec 10, 2017  2:13 PM)    cetirizine (ZYRTEC) 10 MG tablet [id number] Yes Take 10  mg by mouth daily. [provider] Taking Active   Cholecalciferol (VITAMIN D3) 1000 units CAPS [id number] Yes Take 1,000 Units by mouth daily. [provider] Taking Active Self  ciprofloxacin (CIPRO) 500 MG tablet [id number] Yes Take 500 mg by mouth 2 (two) times daily. [provider] Taking Active Self  citalopram (CELEXA) 40 MG tablet [id number] Yes Take 40 mg by mouth daily.  [provider] Taking Active Self  diclofenac sodium (VOLTAREN) 1 % GEL [id number] Yes Apply 2 g topically 3 (three) times daily as needed (PRN for arthritis pain in hands, knees and shoulder-uses about 1 tube/month). [provider] Taking Active Self  dicyclomine (BENTYL) 10 MG capsule [id number] Yes Take 1 capsule (10 mg total) by mouth 3 (three) times daily before meals. Setzer, Rona Ravens, NP Taking Active Self  EPIPEN 2-PAK 0.3 MG/0.3ML SOAJ injection [id number] Yes Inject 0.3 mg into the muscle once.  [provider] Taking Active Self           Med Note Kenton Kingfisher, Germaine Pomfret Dec 10, 2017  2:13 PM)    Eyelid Cleansers (AVENOVA) 0.01 % SOLN [id number] Yes Apply 1 application topically 2 (two) times daily.  [provider] Taking Active Self  levothyroxine (SYNTHROID, LEVOTHROID) 75 MCG tablet [id number] Yes Take 75 mcg by mouth daily before breakfast. [provider] Taking Active Self  Lifitegrast (XIIDRA) 5 % SOLN [id number] Yes Apply 1 drop to eye 2 (two)  times daily. [provider] Taking Active Self  olopatadine (PATANOL) 0.1 % ophthalmic solution [id number] Yes 1 drop 2 (two) times daily. [provider] Taking Active   omeprazole (PRILOSEC) 20 MG capsule [id number] Yes Take 20 mg by mouth daily. [provider] Taking Active Self  ondansetron (ZOFRAN) 4 MG tablet [id number] Yes Take 4 mg by mouth every 8 (eight) hours as needed for nausea or vomiting. [provider] Taking Active Self  propranolol (INDERAL) 60 MG  tablet [id number] Yes Take 60 mg by mouth daily. [provider] Taking Active Self  Propylene Glycol (SYSTANE COMPLETE OP) [id number] Yes Apply 1 drop to eye 4 (four) times daily. Also uses prn [provider] Taking Active Self  rizatriptan (MAXALT) 10 MG tablet [id number] Yes Take 10 mg by mouth as needed for migraine. May repeat in 2 hours if needed [provider] Taking Active Self  rOPINIRole (REQUIP) 2 MG tablet [id number] Yes Take 2 mg by mouth 4 (four) times daily.  [provider] Taking Active Self  Med List Note Rick Duff, [place] D, CPhT 03/01/14 1434): [employer]          Assessment:  Drugs sorted by system:  Neurologic/Psychologic: alprazolam, citalopram, propranolol (migraines), rizatriptan  Pulmonary/Allergy: cetirizine, Epipen   Gastrointestinal: dicyclomine, omeprazole, ondansetron  Endocrine: levothryoxine  Topical: diclofenac gel, lifitegrast, olopatadine, systane,   Infectious Diseases: ciprofloxacin  Vitamins/Minerals/Supplements: cholecalciferol  Miscellaneous: eyelid cleanser (Avenova), ropinirole   Medication Assistance Findings:  Medication assistance needs identified.   Extra Help:   [] Already receiving Full Extra Help  [] Already receiving Partial Extra Help  [] Eligible based on reported income and assets  [x] Not Eligible based on reported income and assets  Patient Assistance Programs: 1) [name] and Patanol made by [employer] o Income requirement met: [x] Yes [] No [] Unknown o Out-of-pocket prescription expenditure met:    [] Yes [] No  [] Unknown  [x] Not applicable   Patient has met application requirements to apply for this patient assistance program.    Plan: I will route patient assistance letter to [place] technician who will coordinate patient assistance program application process for medications listed above.  [facility] pharmacy technician will assist with obtaining all required documents from both  patient and provider(s) and submit application(s) once completed.    Joetta Manners, PharmD Clinical Pharmacist [place] [phone]  Addendum: Incoming call received from [place], Etter Sjogren.  There is no assistance program for Patanol.  It is generic, however, still a Tier 3 medication with HTA.  Informed patient that she should discuss alternate therapies with her doctor that would be cheaper.  She verbalized understanding.  Joetta Manners, PharmD Clinical Pharmacist [place] [phone]

## 2018-05-07 NOTE — Patient Outreach (Signed)
[facility]) Care Management  05/07/2018  Teresa Galloway Oct 22, 1955 [id number]   Referral Date: 05/07/2018 Referral Source: [facility] referral Referral Reason: Patient having problems affording medications.   Outreach Attempt: spoke with patient. Discussed reason for referral.  Patient states that she just cannot afford eye drops prescribed for allergy/dry eyes.  Patient reports that she was prescribed xiidra but could not afford co-payment after finishing samples given. Patient reports that the xiidra worked really well but just could not afford them.  She was then prescribed Olopatadine, which is $45 per bottle that last one month.  Patient states she did not get them because she just could not afford them.    Patient admits to hypothyroidism, IBS, depression, hyperlipidemia, and sleep apnea.  Patient denies problems with depression. She states that her medications are working well for her.    Discussed with patient [facility] services and support.  She is agreeable to pharmacy services at this time for help with medications.     Plan: RN CM will refer patient to pharmacy for medication assistance.   Jone Baseman, RN, MSN [facility] Care Management Care Management Coordinator Direct Line [phone] Toll Free: [phone]  Fax: [phone]

## 2018-05-11 DIAGNOSIS — M19042 Primary osteoarthritis, left hand: Secondary | ICD-10-CM | POA: Diagnosis not present

## 2018-05-11 DIAGNOSIS — Z6823 Body mass index (BMI) 23.0-23.9, adult: Secondary | ICD-10-CM | POA: Diagnosis not present

## 2018-05-11 DIAGNOSIS — M2390 Unspecified internal derangement of unspecified knee: Secondary | ICD-10-CM | POA: Diagnosis not present

## 2018-05-11 DIAGNOSIS — M19041 Primary osteoarthritis, right hand: Secondary | ICD-10-CM | POA: Diagnosis not present

## 2018-05-11 DIAGNOSIS — M25512 Pain in left shoulder: Secondary | ICD-10-CM | POA: Diagnosis not present

## 2018-05-11 DIAGNOSIS — Z299 Encounter for prophylactic measures, unspecified: Secondary | ICD-10-CM | POA: Diagnosis not present

## 2018-05-11 DIAGNOSIS — R030 Elevated blood-pressure reading, without diagnosis of hypertension: Secondary | ICD-10-CM | POA: Diagnosis not present

## 2018-05-19 ENCOUNTER — Other Ambulatory Visit: Payer: Self-pay | Admitting: Pharmacy Technician

## 2018-05-19 NOTE — Patient Outreach (Signed)
[facility]) Care Management  05/19/2018  AKEIRA LAHM July 26, 1955 [id number]    Successful call placed to patient regarding patient assistance application(s) for [name] , HIPAA identifiers verified. Ms. Hoch states that it is possible that she has received application but has put it to the side. She states that she would look thru her unopened mail.   Will follow up with patient in 3-5 business days to verify application has been received.  Maud Deed Chana Bode [place] Certified Pharmacy Technician [place] Management Direct [phone]

## 2018-05-22 DIAGNOSIS — M170 Bilateral primary osteoarthritis of knee: Secondary | ICD-10-CM | POA: Diagnosis not present

## 2018-05-25 DIAGNOSIS — Z299 Encounter for prophylactic measures, unspecified: Secondary | ICD-10-CM | POA: Diagnosis not present

## 2018-05-25 DIAGNOSIS — R030 Elevated blood-pressure reading, without diagnosis of hypertension: Secondary | ICD-10-CM | POA: Diagnosis not present

## 2018-05-25 DIAGNOSIS — F419 Anxiety disorder, unspecified: Secondary | ICD-10-CM | POA: Diagnosis not present

## 2018-05-25 DIAGNOSIS — Z6823 Body mass index (BMI) 23.0-23.9, adult: Secondary | ICD-10-CM | POA: Diagnosis not present

## 2018-05-25 DIAGNOSIS — F329 Major depressive disorder, single episode, unspecified: Secondary | ICD-10-CM | POA: Diagnosis not present

## 2018-05-25 DIAGNOSIS — M170 Bilateral primary osteoarthritis of knee: Secondary | ICD-10-CM | POA: Diagnosis not present

## 2018-05-25 DIAGNOSIS — R413 Other amnesia: Secondary | ICD-10-CM | POA: Diagnosis not present

## 2018-05-26 DIAGNOSIS — R413 Other amnesia: Secondary | ICD-10-CM | POA: Diagnosis not present

## 2018-05-27 DIAGNOSIS — R413 Other amnesia: Secondary | ICD-10-CM | POA: Diagnosis not present

## 2018-05-28 ENCOUNTER — Institutional Professional Consult (permissible substitution): Payer: PPO | Admitting: Neurology

## 2018-05-28 DIAGNOSIS — M170 Bilateral primary osteoarthritis of knee: Secondary | ICD-10-CM | POA: Diagnosis not present

## 2018-06-01 DIAGNOSIS — M170 Bilateral primary osteoarthritis of knee: Secondary | ICD-10-CM | POA: Diagnosis not present

## 2018-06-02 DIAGNOSIS — R413 Other amnesia: Secondary | ICD-10-CM | POA: Diagnosis not present

## 2018-06-02 DIAGNOSIS — Z6823 Body mass index (BMI) 23.0-23.9, adult: Secondary | ICD-10-CM | POA: Diagnosis not present

## 2018-06-02 DIAGNOSIS — Z299 Encounter for prophylactic measures, unspecified: Secondary | ICD-10-CM | POA: Diagnosis not present

## 2018-06-02 DIAGNOSIS — E559 Vitamin D deficiency, unspecified: Secondary | ICD-10-CM | POA: Diagnosis not present

## 2018-06-03 DIAGNOSIS — Z6823 Body mass index (BMI) 23.0-23.9, adult: Secondary | ICD-10-CM | POA: Diagnosis not present

## 2018-06-03 DIAGNOSIS — F329 Major depressive disorder, single episode, unspecified: Secondary | ICD-10-CM | POA: Diagnosis not present

## 2018-06-03 DIAGNOSIS — E559 Vitamin D deficiency, unspecified: Secondary | ICD-10-CM | POA: Diagnosis not present

## 2018-06-03 DIAGNOSIS — Z299 Encounter for prophylactic measures, unspecified: Secondary | ICD-10-CM | POA: Diagnosis not present

## 2018-06-03 DIAGNOSIS — R413 Other amnesia: Secondary | ICD-10-CM | POA: Diagnosis not present

## 2018-06-03 DIAGNOSIS — F419 Anxiety disorder, unspecified: Secondary | ICD-10-CM | POA: Diagnosis not present

## 2018-06-05 DIAGNOSIS — E039 Hypothyroidism, unspecified: Secondary | ICD-10-CM | POA: Diagnosis not present

## 2018-06-05 DIAGNOSIS — Z6823 Body mass index (BMI) 23.0-23.9, adult: Secondary | ICD-10-CM | POA: Diagnosis not present

## 2018-06-05 DIAGNOSIS — Z87891 Personal history of nicotine dependence: Secondary | ICD-10-CM | POA: Diagnosis not present

## 2018-06-05 DIAGNOSIS — R509 Fever, unspecified: Secondary | ICD-10-CM | POA: Diagnosis not present

## 2018-06-05 DIAGNOSIS — Z299 Encounter for prophylactic measures, unspecified: Secondary | ICD-10-CM | POA: Diagnosis not present

## 2018-06-15 DIAGNOSIS — M170 Bilateral primary osteoarthritis of knee: Secondary | ICD-10-CM | POA: Diagnosis not present

## 2018-06-16 ENCOUNTER — Other Ambulatory Visit: Payer: Self-pay | Admitting: Pharmacy Technician

## 2018-06-16 NOTE — Patient Outreach (Signed)
Teresa Galloway [facility]) Care Management  06/16/2018  Teresa Galloway 1955-09-20 [id number]    Unsuccessful call #1 placed to patient regarding patient assistance application(s) for [name] , HIPAA compliant voicemail left. Calling to verify application has been received and mailed back.  Follow up:  Will follow up with patient in 2-3 business days   Maud Deed. Chana Bode [place] Certified Pharmacy Technician [place] Management Direct Dial:[phone]

## 2018-06-19 DIAGNOSIS — M170 Bilateral primary osteoarthritis of knee: Secondary | ICD-10-CM | POA: Diagnosis not present

## 2018-06-26 DIAGNOSIS — M170 Bilateral primary osteoarthritis of knee: Secondary | ICD-10-CM | POA: Diagnosis not present

## 2018-06-29 ENCOUNTER — Other Ambulatory Visit: Payer: Self-pay | Admitting: Pharmacy Technician

## 2018-06-29 NOTE — Patient Outreach (Signed)
[place] [facility]) Care Management  06/29/2018  Teresa REZNICK 1955-12-04 [id number]    Successful call placed to patient regarding patient assistance application(s) for [name] , HIPAA identifiers verified. [name] states that she threw the application away thinking that it would just reduce her co-pay. Informed her that the application is in effort to receive medication for free for the rest of the year. Now that patient has understanding of the process, will prepare new application to mailed out.  Follow up:  Will follow up with patient in 7-10 business days to verify application has been received.  Maud Deed Chana Bode [place] Certified Pharmacy Technician [place] Management Direct Dial:[phone]

## 2018-07-03 DIAGNOSIS — M170 Bilateral primary osteoarthritis of knee: Secondary | ICD-10-CM | POA: Diagnosis not present

## 2018-07-06 ENCOUNTER — Other Ambulatory Visit: Payer: Self-pay

## 2018-07-06 DIAGNOSIS — E039 Hypothyroidism, unspecified: Secondary | ICD-10-CM | POA: Diagnosis not present

## 2018-07-06 DIAGNOSIS — Z0389 Encounter for observation for other suspected diseases and conditions ruled out: Secondary | ICD-10-CM | POA: Diagnosis not present

## 2018-07-06 DIAGNOSIS — K219 Gastro-esophageal reflux disease without esophagitis: Secondary | ICD-10-CM | POA: Diagnosis not present

## 2018-07-06 DIAGNOSIS — R0602 Shortness of breath: Secondary | ICD-10-CM | POA: Diagnosis not present

## 2018-07-06 DIAGNOSIS — Z79899 Other long term (current) drug therapy: Secondary | ICD-10-CM | POA: Diagnosis not present

## 2018-07-06 DIAGNOSIS — R319 Hematuria, unspecified: Secondary | ICD-10-CM | POA: Diagnosis not present

## 2018-07-06 DIAGNOSIS — F329 Major depressive disorder, single episode, unspecified: Secondary | ICD-10-CM | POA: Diagnosis not present

## 2018-07-06 DIAGNOSIS — R05 Cough: Secondary | ICD-10-CM | POA: Diagnosis not present

## 2018-07-06 DIAGNOSIS — F419 Anxiety disorder, unspecified: Secondary | ICD-10-CM | POA: Diagnosis not present

## 2018-07-06 DIAGNOSIS — R110 Nausea: Secondary | ICD-10-CM | POA: Diagnosis not present

## 2018-07-06 DIAGNOSIS — Z87891 Personal history of nicotine dependence: Secondary | ICD-10-CM | POA: Diagnosis not present

## 2018-07-06 DIAGNOSIS — N390 Urinary tract infection, site not specified: Secondary | ICD-10-CM | POA: Diagnosis not present

## 2018-07-06 DIAGNOSIS — Z2089 Contact with and (suspected) exposure to other communicable diseases: Secondary | ICD-10-CM | POA: Diagnosis not present

## 2018-07-06 DIAGNOSIS — R197 Diarrhea, unspecified: Secondary | ICD-10-CM | POA: Diagnosis not present

## 2018-07-06 DIAGNOSIS — R51 Headache: Secondary | ICD-10-CM | POA: Diagnosis not present

## 2018-07-06 DIAGNOSIS — B349 Viral infection, unspecified: Secondary | ICD-10-CM | POA: Diagnosis not present

## 2018-07-06 NOTE — Patient Outreach (Signed)
[facility]) Care Management  07/06/2018  [name] [birth year] [id number]   Referral Date: 07/06/2018 Referral Source: [place] Referral Reason: Cough, shortness of breath, and diarrhea   Outreach Attempt: spoke with patient. She is able to verify HIPAA.  Patient states she was told to go to the ER and that she is getting ready to go.  Advised patient that CM would outreach another time to find out patient status.  She verbalized understanding.     Plan: RN CM will attempt patient again within 4 business days.     Jone Baseman, RN, MSN [facility] Care Management Care Management Coordinator Direct Line [phone] Toll Free: [phone]  Fax: [phone]

## 2018-07-08 ENCOUNTER — Other Ambulatory Visit: Payer: Self-pay

## 2018-07-08 NOTE — Patient Outreach (Signed)
[facility]) Care Management  07/08/2018  CHRISTLE NOLTING 07/12/1955 [id number]   Referral Date: 07/06/2018 Referral Source: Nurseline Referral Reason: Cough, shortness of breath, and diarrhea   Outreach Attempt: Spoke with patient.  She is able to verify HIPAA.  She states that she is doing much better today.  She saw the physician and was diagnosed with viral illness.  She states she still has some cough and feels tired but no more shortness of breath.  Discussed with patient signs of worsening and when to contact physician.  She verbalized understanding.  She states she is eating a drinking and has no food insecurities.  She states that she has the support of her sister who bring over items and leaves them on her porch.  Patient appreciative of call.  Advised patient that CM would follow up with patient again within the next 4 business days.  Patient agreeable to next outreach.    Plan: RN CM will contact patient again within 4 business days.    Jone Baseman, RN, MSN [place] Management Care Management Coordinator Direct Line [phone] Cell [phone] Toll Free: [phone]  Fax: [phone]

## 2018-07-10 ENCOUNTER — Other Ambulatory Visit: Payer: Self-pay

## 2018-07-10 ENCOUNTER — Ambulatory Visit: Payer: Self-pay

## 2018-07-10 NOTE — Patient Outreach (Signed)
[facility]) Care Management  07/10/2018  Teresa Galloway [birth year] [id number]   Telephone call to patient for follow up. Patient able to verify HIPAA.  She states she is doing much better except for a cough if she talks too much.  She states that she is eating and drinking well. No shortness of breath.  Patient denies any food insecurities and has all her medications.  Discussed with patient signs of worsening such as fever, shortness of breath and pain.  She verbalized understanding and is appreciative of follow ups.  She denies any needs.    Plan: RN CM will close case.    Jone Baseman, RN, MSN [place] Management Care Management Coordinator Direct Line [phone] Cell [phone] Toll Free: [phone]  Fax: [phone]

## 2018-07-14 DIAGNOSIS — I340 Nonrheumatic mitral (valve) insufficiency: Secondary | ICD-10-CM | POA: Diagnosis not present

## 2018-07-14 DIAGNOSIS — N390 Urinary tract infection, site not specified: Secondary | ICD-10-CM | POA: Diagnosis not present

## 2018-07-14 DIAGNOSIS — I480 Paroxysmal atrial fibrillation: Secondary | ICD-10-CM | POA: Diagnosis not present

## 2018-07-14 DIAGNOSIS — I10 Essential (primary) hypertension: Secondary | ICD-10-CM | POA: Diagnosis not present

## 2018-07-14 DIAGNOSIS — I5031 Acute diastolic (congestive) heart failure: Secondary | ICD-10-CM | POA: Diagnosis not present

## 2018-07-14 DIAGNOSIS — G4733 Obstructive sleep apnea (adult) (pediatric): Secondary | ICD-10-CM | POA: Diagnosis not present

## 2018-07-14 DIAGNOSIS — E039 Hypothyroidism, unspecified: Secondary | ICD-10-CM | POA: Diagnosis not present

## 2018-07-14 DIAGNOSIS — F17210 Nicotine dependence, cigarettes, uncomplicated: Secondary | ICD-10-CM | POA: Diagnosis not present

## 2018-07-14 DIAGNOSIS — I272 Pulmonary hypertension, unspecified: Secondary | ICD-10-CM | POA: Diagnosis not present

## 2018-07-14 DIAGNOSIS — Z6823 Body mass index (BMI) 23.0-23.9, adult: Secondary | ICD-10-CM | POA: Diagnosis not present

## 2018-07-14 DIAGNOSIS — J9621 Acute and chronic respiratory failure with hypoxia: Secondary | ICD-10-CM | POA: Diagnosis not present

## 2018-07-14 DIAGNOSIS — F329 Major depressive disorder, single episode, unspecified: Secondary | ICD-10-CM | POA: Diagnosis not present

## 2018-07-20 DIAGNOSIS — Z713 Dietary counseling and surveillance: Secondary | ICD-10-CM | POA: Diagnosis not present

## 2018-07-20 DIAGNOSIS — N390 Urinary tract infection, site not specified: Secondary | ICD-10-CM | POA: Diagnosis not present

## 2018-07-20 DIAGNOSIS — R350 Frequency of micturition: Secondary | ICD-10-CM | POA: Diagnosis not present

## 2018-07-20 DIAGNOSIS — Z299 Encounter for prophylactic measures, unspecified: Secondary | ICD-10-CM | POA: Diagnosis not present

## 2018-07-24 DIAGNOSIS — M170 Bilateral primary osteoarthritis of knee: Secondary | ICD-10-CM | POA: Diagnosis not present

## 2018-08-03 ENCOUNTER — Other Ambulatory Visit: Payer: Self-pay | Admitting: Pharmacy Technician

## 2018-08-03 NOTE — Patient Outreach (Signed)
[facility]) Care Management  08/03/2018  Teresa Galloway 11-Jun-1955 [id number]    Unsuccessful call #1 placed to patient regarding patient assistance application(s) for [name] , HIPAA compliant voicemail left.   Follow up:  Will make 2nd call attempt in 2-3 business days if call has not been returned.  Maud Deed Chana Bode [place] Certified Pharmacy Technician [place] Management Direct Dial:[phone]

## 2018-08-03 NOTE — Patient Outreach (Signed)
[facility]) Care Management  08/03/2018  Teresa Galloway Aug 08, 1955 [id number]   Return call from patient, HIPAA identifiers verified. Ms. Safer states that she received the patient assistance application and forgot to fill them out. She states she will fill them out today and will call me if she has any questions.  Will follow up with patient in 14-21 business days if documents have not been received.  [name] Certified Pharmacy Technician [place] Management Direct Dial:[phone]

## 2018-08-04 DIAGNOSIS — Z299 Encounter for prophylactic measures, unspecified: Secondary | ICD-10-CM | POA: Diagnosis not present

## 2018-08-04 DIAGNOSIS — M7050 Other bursitis of knee, unspecified knee: Secondary | ICD-10-CM | POA: Diagnosis not present

## 2018-08-04 DIAGNOSIS — J302 Other seasonal allergic rhinitis: Secondary | ICD-10-CM | POA: Diagnosis not present

## 2018-08-04 DIAGNOSIS — E039 Hypothyroidism, unspecified: Secondary | ICD-10-CM | POA: Diagnosis not present

## 2018-08-04 DIAGNOSIS — Z6823 Body mass index (BMI) 23.0-23.9, adult: Secondary | ICD-10-CM | POA: Diagnosis not present

## 2018-08-04 DIAGNOSIS — M2390 Unspecified internal derangement of unspecified knee: Secondary | ICD-10-CM | POA: Diagnosis not present

## 2018-08-04 DIAGNOSIS — M7051 Other bursitis of knee, right knee: Secondary | ICD-10-CM | POA: Diagnosis not present

## 2018-08-06 DIAGNOSIS — F5222 Female sexual arousal disorder: Secondary | ICD-10-CM | POA: Diagnosis not present

## 2018-08-06 DIAGNOSIS — L570 Actinic keratosis: Secondary | ICD-10-CM | POA: Diagnosis not present

## 2018-08-06 DIAGNOSIS — Z6828 Body mass index (BMI) 28.0-28.9, adult: Secondary | ICD-10-CM | POA: Diagnosis not present

## 2018-08-06 DIAGNOSIS — Z713 Dietary counseling and surveillance: Secondary | ICD-10-CM | POA: Diagnosis not present

## 2018-08-06 DIAGNOSIS — Z299 Encounter for prophylactic measures, unspecified: Secondary | ICD-10-CM | POA: Diagnosis not present

## 2018-08-07 DIAGNOSIS — Z299 Encounter for prophylactic measures, unspecified: Secondary | ICD-10-CM | POA: Diagnosis not present

## 2018-08-07 DIAGNOSIS — Z6823 Body mass index (BMI) 23.0-23.9, adult: Secondary | ICD-10-CM | POA: Diagnosis not present

## 2018-08-07 DIAGNOSIS — T148XXA Other injury of unspecified body region, initial encounter: Secondary | ICD-10-CM | POA: Diagnosis not present

## 2018-08-07 DIAGNOSIS — E781 Pure hyperglyceridemia: Secondary | ICD-10-CM | POA: Diagnosis not present

## 2018-08-17 DIAGNOSIS — Z6823 Body mass index (BMI) 23.0-23.9, adult: Secondary | ICD-10-CM | POA: Diagnosis not present

## 2018-08-17 DIAGNOSIS — J309 Allergic rhinitis, unspecified: Secondary | ICD-10-CM | POA: Diagnosis not present

## 2018-08-17 DIAGNOSIS — Z299 Encounter for prophylactic measures, unspecified: Secondary | ICD-10-CM | POA: Diagnosis not present

## 2018-08-18 DIAGNOSIS — H02889 Meibomian gland dysfunction of unspecified eye, unspecified eyelid: Secondary | ICD-10-CM | POA: Diagnosis not present

## 2018-08-18 DIAGNOSIS — H04123 Dry eye syndrome of bilateral lacrimal glands: Secondary | ICD-10-CM | POA: Diagnosis not present

## 2018-08-19 DIAGNOSIS — Z6823 Body mass index (BMI) 23.0-23.9, adult: Secondary | ICD-10-CM | POA: Diagnosis not present

## 2018-08-19 DIAGNOSIS — M25552 Pain in left hip: Secondary | ICD-10-CM | POA: Diagnosis not present

## 2018-08-19 DIAGNOSIS — F419 Anxiety disorder, unspecified: Secondary | ICD-10-CM | POA: Diagnosis not present

## 2018-08-19 DIAGNOSIS — Z299 Encounter for prophylactic measures, unspecified: Secondary | ICD-10-CM | POA: Diagnosis not present

## 2018-08-21 ENCOUNTER — Other Ambulatory Visit: Payer: Self-pay | Admitting: Pharmacist

## 2018-08-21 ENCOUNTER — Other Ambulatory Visit: Payer: Self-pay | Admitting: Pharmacy Technician

## 2018-08-21 NOTE — Patient Outreach (Signed)
[facility]) Care Management  08/21/2018  MAILEY LANDSTROM 1955-05-09 [id number]  Message received from pharmacy technician Caryl Pina, patient is no longer needing patient assistance for [facility] as prescriber discontinued medication.  See note in chart from today 08/21/2018.   Plan:    Will close case.    Karrie Meres, PharmD, [place] [phone]

## 2018-08-21 NOTE — Patient Outreach (Signed)
[facility]) Care Management  08/21/2018  Teresa Galloway April 29, 1955 [id number]   Incoming call for Teresa Galloway stating that she recently went to her eye doctor and he has discontinued the use of Xiidra. She appreciates [place] trying to help her with cost of it but provider has decided to try another regimen which includes OTC items.  Will route note to [place] for case closure.  Maud Deed Chana Bode [place] Certified Pharmacy Technician [employer] Management Direct Dial:[phone]

## 2018-09-21 DIAGNOSIS — K580 Irritable bowel syndrome with diarrhea: Secondary | ICD-10-CM | POA: Diagnosis not present

## 2018-09-21 DIAGNOSIS — F419 Anxiety disorder, unspecified: Secondary | ICD-10-CM | POA: Diagnosis not present

## 2018-09-21 DIAGNOSIS — K6389 Other specified diseases of intestine: Secondary | ICD-10-CM | POA: Diagnosis not present

## 2018-09-21 DIAGNOSIS — Z882 Allergy status to sulfonamides status: Secondary | ICD-10-CM | POA: Diagnosis not present

## 2018-09-21 DIAGNOSIS — Z6823 Body mass index (BMI) 23.0-23.9, adult: Secondary | ICD-10-CM | POA: Diagnosis not present

## 2018-09-21 DIAGNOSIS — R112 Nausea with vomiting, unspecified: Secondary | ICD-10-CM | POA: Diagnosis not present

## 2018-09-21 DIAGNOSIS — R0602 Shortness of breath: Secondary | ICD-10-CM | POA: Diagnosis not present

## 2018-09-21 DIAGNOSIS — R197 Diarrhea, unspecified: Secondary | ICD-10-CM | POA: Diagnosis not present

## 2018-09-21 DIAGNOSIS — Z888 Allergy status to other drugs, medicaments and biological substances status: Secondary | ICD-10-CM | POA: Diagnosis not present

## 2018-09-21 DIAGNOSIS — Z87891 Personal history of nicotine dependence: Secondary | ICD-10-CM | POA: Diagnosis not present

## 2018-09-21 DIAGNOSIS — F329 Major depressive disorder, single episode, unspecified: Secondary | ICD-10-CM | POA: Diagnosis not present

## 2018-09-21 DIAGNOSIS — K560 Paralytic ileus: Secondary | ICD-10-CM | POA: Diagnosis not present

## 2018-09-21 DIAGNOSIS — Z299 Encounter for prophylactic measures, unspecified: Secondary | ICD-10-CM | POA: Diagnosis not present

## 2018-09-22 DIAGNOSIS — Z713 Dietary counseling and surveillance: Secondary | ICD-10-CM | POA: Diagnosis not present

## 2018-09-22 DIAGNOSIS — K560 Paralytic ileus: Secondary | ICD-10-CM | POA: Diagnosis not present

## 2018-09-22 DIAGNOSIS — K589 Irritable bowel syndrome without diarrhea: Secondary | ICD-10-CM | POA: Diagnosis not present

## 2018-09-22 DIAGNOSIS — Z6823 Body mass index (BMI) 23.0-23.9, adult: Secondary | ICD-10-CM | POA: Diagnosis not present

## 2018-10-14 DIAGNOSIS — M26609 Unspecified temporomandibular joint disorder, unspecified side: Secondary | ICD-10-CM | POA: Diagnosis not present

## 2018-10-14 DIAGNOSIS — F419 Anxiety disorder, unspecified: Secondary | ICD-10-CM | POA: Diagnosis not present

## 2018-10-14 DIAGNOSIS — E039 Hypothyroidism, unspecified: Secondary | ICD-10-CM | POA: Diagnosis not present

## 2018-10-14 DIAGNOSIS — Z6823 Body mass index (BMI) 23.0-23.9, adult: Secondary | ICD-10-CM | POA: Diagnosis not present

## 2018-10-14 DIAGNOSIS — Z299 Encounter for prophylactic measures, unspecified: Secondary | ICD-10-CM | POA: Diagnosis not present

## 2018-10-14 DIAGNOSIS — F329 Major depressive disorder, single episode, unspecified: Secondary | ICD-10-CM | POA: Diagnosis not present

## 2018-10-30 DIAGNOSIS — Z299 Encounter for prophylactic measures, unspecified: Secondary | ICD-10-CM | POA: Diagnosis not present

## 2018-10-30 DIAGNOSIS — F419 Anxiety disorder, unspecified: Secondary | ICD-10-CM | POA: Diagnosis not present

## 2018-10-30 DIAGNOSIS — Z6823 Body mass index (BMI) 23.0-23.9, adult: Secondary | ICD-10-CM | POA: Diagnosis not present

## 2018-10-30 DIAGNOSIS — M26609 Unspecified temporomandibular joint disorder, unspecified side: Secondary | ICD-10-CM | POA: Diagnosis not present

## 2018-10-30 DIAGNOSIS — E039 Hypothyroidism, unspecified: Secondary | ICD-10-CM | POA: Diagnosis not present

## 2018-10-30 DIAGNOSIS — I10 Essential (primary) hypertension: Secondary | ICD-10-CM | POA: Diagnosis not present

## 2018-11-09 DIAGNOSIS — G4730 Sleep apnea, unspecified: Secondary | ICD-10-CM | POA: Diagnosis not present

## 2018-11-09 DIAGNOSIS — E039 Hypothyroidism, unspecified: Secondary | ICD-10-CM | POA: Diagnosis not present

## 2018-11-09 DIAGNOSIS — Z299 Encounter for prophylactic measures, unspecified: Secondary | ICD-10-CM | POA: Diagnosis not present

## 2018-11-09 DIAGNOSIS — G2581 Restless legs syndrome: Secondary | ICD-10-CM | POA: Diagnosis not present

## 2018-11-09 DIAGNOSIS — Z6823 Body mass index (BMI) 23.0-23.9, adult: Secondary | ICD-10-CM | POA: Diagnosis not present

## 2018-11-09 DIAGNOSIS — I10 Essential (primary) hypertension: Secondary | ICD-10-CM | POA: Diagnosis not present

## 2018-11-26 DIAGNOSIS — Z299 Encounter for prophylactic measures, unspecified: Secondary | ICD-10-CM | POA: Diagnosis not present

## 2018-11-26 DIAGNOSIS — Z713 Dietary counseling and surveillance: Secondary | ICD-10-CM | POA: Diagnosis not present

## 2018-11-26 DIAGNOSIS — G2581 Restless legs syndrome: Secondary | ICD-10-CM | POA: Diagnosis not present

## 2018-11-26 DIAGNOSIS — Z6823 Body mass index (BMI) 23.0-23.9, adult: Secondary | ICD-10-CM | POA: Diagnosis not present

## 2018-11-26 DIAGNOSIS — J302 Other seasonal allergic rhinitis: Secondary | ICD-10-CM | POA: Diagnosis not present

## 2018-12-05 DIAGNOSIS — H04123 Dry eye syndrome of bilateral lacrimal glands: Secondary | ICD-10-CM | POA: Diagnosis not present

## 2018-12-05 DIAGNOSIS — H40033 Anatomical narrow angle, bilateral: Secondary | ICD-10-CM | POA: Diagnosis not present

## 2018-12-08 DIAGNOSIS — Z7189 Other specified counseling: Secondary | ICD-10-CM | POA: Diagnosis not present

## 2018-12-08 DIAGNOSIS — Z1211 Encounter for screening for malignant neoplasm of colon: Secondary | ICD-10-CM | POA: Diagnosis not present

## 2018-12-08 DIAGNOSIS — Z6823 Body mass index (BMI) 23.0-23.9, adult: Secondary | ICD-10-CM | POA: Diagnosis not present

## 2018-12-08 DIAGNOSIS — Z1331 Encounter for screening for depression: Secondary | ICD-10-CM | POA: Diagnosis not present

## 2018-12-08 DIAGNOSIS — Z0000 Encounter for general adult medical examination without abnormal findings: Secondary | ICD-10-CM | POA: Diagnosis not present

## 2018-12-08 DIAGNOSIS — E7800 Pure hypercholesterolemia, unspecified: Secondary | ICD-10-CM | POA: Diagnosis not present

## 2018-12-08 DIAGNOSIS — Z299 Encounter for prophylactic measures, unspecified: Secondary | ICD-10-CM | POA: Diagnosis not present

## 2018-12-08 DIAGNOSIS — R5383 Other fatigue: Secondary | ICD-10-CM | POA: Diagnosis not present

## 2018-12-08 DIAGNOSIS — Z79899 Other long term (current) drug therapy: Secondary | ICD-10-CM | POA: Diagnosis not present

## 2018-12-08 DIAGNOSIS — E039 Hypothyroidism, unspecified: Secondary | ICD-10-CM | POA: Diagnosis not present

## 2018-12-08 DIAGNOSIS — Z1339 Encounter for screening examination for other mental health and behavioral disorders: Secondary | ICD-10-CM | POA: Diagnosis not present

## 2018-12-08 DIAGNOSIS — E559 Vitamin D deficiency, unspecified: Secondary | ICD-10-CM | POA: Diagnosis not present

## 2018-12-17 DIAGNOSIS — L600 Ingrowing nail: Secondary | ICD-10-CM | POA: Diagnosis not present

## 2018-12-17 DIAGNOSIS — Z299 Encounter for prophylactic measures, unspecified: Secondary | ICD-10-CM | POA: Diagnosis not present

## 2018-12-17 DIAGNOSIS — E039 Hypothyroidism, unspecified: Secondary | ICD-10-CM | POA: Diagnosis not present

## 2018-12-17 DIAGNOSIS — F419 Anxiety disorder, unspecified: Secondary | ICD-10-CM | POA: Diagnosis not present

## 2018-12-17 DIAGNOSIS — Z6823 Body mass index (BMI) 23.0-23.9, adult: Secondary | ICD-10-CM | POA: Diagnosis not present

## 2018-12-21 DIAGNOSIS — L03032 Cellulitis of left toe: Secondary | ICD-10-CM | POA: Diagnosis not present

## 2018-12-21 DIAGNOSIS — B351 Tinea unguium: Secondary | ICD-10-CM | POA: Diagnosis not present

## 2018-12-21 DIAGNOSIS — L03031 Cellulitis of right toe: Secondary | ICD-10-CM | POA: Diagnosis not present

## 2018-12-29 NOTE — Progress Notes (Signed)
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  History supplemented by referring provider notes.  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  She was started on Lithium in March and developed various symptoms in June, such as memory deficits/word-finding difficulty, profuse sweating, left arm pain and GI issues.  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  She stopped Lithium.  However, she continues to have profuse sweating, tingling itch across the face and hands (like a feather running across her skin.     She still has tingling and aching pain radiating down the left arm from shoulder down to the entire hand. No weakness.  No neck pain.  She does have history of cervical  C5-C6-C7 spine fusion 10 years ago.  She says her RLS is worse now.  It lasts all day.   She still reports short-term memory deficits.    Current medications:  Ropinirole 2mg  four times daily; alprazolam 0.25mg  infrequently but may be taken four times daily PRN; duloxetine 60mg  daily; rizatriptan 10mg ; propranolol 60mg  daily; levothyroxine.  Past medications:  lamotrigine; lithium; gabapentin (muscle jerks), Lyrica.     10/06/18 LABS:  WBC 7.7, HCT 44.6, PLT 264, Na 237, K 4.5, CO2 26.2, glucose 92, t bili 0.7, ALT 24, AST 18, Hgb A1c 5.5.  PAST MEDICAL HISTORY: Past Medical History:  Diagnosis Date  . Acute pericarditis    a. Dx 12/2013.  Marland Kitchen Anxiety   . Complication of anesthesia    effects lasted for 3 days  . Depression    . Dyslipidemia   . Hx of migraines    a. On propranolol.  . Hypothyroid   . Pericardial effusion    a. Dx 12/2013 - moderate without hemodynamic compromise.  . Sleep apnea     PAST SURGICAL HISTORY: Past Surgical History:  Procedure Laterality Date  . BREAST SURGERY    . CERVICAL FUSION    . CHOLECYSTECTOMY    . COLONOSCOPY WITH PROPOFOL N/A 01/16/2018   Procedure: COLONOSCOPY WITH PROPOFOL;  Surgeon: Rogene Houston, MD;  Location: [facility];  Service: Endoscopy;  Laterality: N/A;  10:55  . NASAL SINUS SURGERY    . POLYPECTOMY  01/16/2018   Procedure: POLYPECTOMY;  Surgeon: Rogene Houston, MD;  Location: [facility];  Service: Endoscopy;;  colon  . VAGINAL HYSTERECTOMY      MEDICATIONS: Current Outpatient Medications on File Prior to Visit  Medication Sig Dispense Refill  . ALPRAZolam (XANAX) 0.25 MG tablet Take 0.25 mg by mouth 4 (four) times daily as needed for anxiety.     . cetirizine (ZYRTEC) 10 MG tablet Take 10 mg by mouth daily.    . Cholecalciferol (VITAMIN D3) 1000 units CAPS Take 1,000 Units by mouth daily.    . citalopram (CELEXA) 40 MG tablet Take 40 mg by mouth daily.     . diclofenac sodium (VOLTAREN) 1 % GEL Apply 2 g topically 3 (three) times daily as needed (PRN for arthritis pain in hands, knees and shoulder-uses about 1 tube/month).    . dicyclomine (BENTYL)  10 MG capsule Take 1 capsule (10 mg total) by mouth 3 (three) times daily before meals. 90 capsule 4  . EPIPEN 2-PAK 0.3 MG/0.3ML SOAJ injection Inject 0.3 mg into the muscle once.   1  . Eyelid Cleansers (AVENOVA) 0.01 % SOLN Apply 1 application topically 2 (two) times daily.     Marland Kitchen levothyroxine (SYNTHROID, LEVOTHROID) 75 MCG tablet Take 75 mcg by mouth daily before breakfast.    . Lifitegrast (XIIDRA) 5 % SOLN Apply 1 drop to eye 2 (two) times daily.    Marland Kitchen olopatadine (PATANOL) 0.1 % ophthalmic solution Place 1 drop into both eyes 2 (two) times daily.     Marland Kitchen omeprazole (PRILOSEC) 20 MG capsule  Take 20 mg by mouth daily.    . ondansetron (ZOFRAN) 4 MG tablet Take 4 mg by mouth every 8 (eight) hours as needed for nausea or vomiting.    . propranolol (INDERAL) 60 MG tablet Take 60 mg by mouth daily.    Marland Kitchen Propylene Glycol (SYSTANE COMPLETE OP) Apply 1 drop to eye 4 (four) times daily. Also uses prn    . rizatriptan (MAXALT) 10 MG tablet Take 10 mg by mouth as needed for migraine. May repeat in 2 hours if needed    . rOPINIRole (REQUIP) 2 MG tablet Take 2 mg by mouth 4 (four) times daily.      No current facility-administered medications on file prior to visit.     ALLERGIES: Allergies  Allergen Reactions  . Other Anaphylaxis, Swelling and Other (See Comments)    Walnuts  . Sulfa Antibiotics Shortness Of Breath and Other (See Comments)    Swelling of lips   . Sunscreens Anaphylaxis, Hives and Other (See Comments)    Titanium and zinc oxides Eye swelling   . Seroquel [Quetiapine] Other (See Comments)    Pychosis (foggy, uncoordinated, slurred speech)  . Tape Hives    FAMILY HISTORY: Family History  Problem Relation Age of Onset  . Depression Mother   . Depression Father   . Depression Sister   . Anxiety disorder Sister   . Alcohol abuse Maternal Uncle   . Alcohol abuse Paternal Uncle   . Alcohol abuse Maternal Grandfather   . Depression Maternal Grandmother     SOCIAL HISTORY: Social History   Socioeconomic History  . Marital status: Married    Spouse name: Not on file  . Number of children: Not on file  . Years of education: Not on file  . Highest education level: Not on file  Occupational History  . Not on file  Social Needs  . Financial resource strain: Not on file  . Food insecurity    Worry: Not on file    Inability: Not on file  . Transportation needs    Medical: Not on file    Non-medical: Not on file  Tobacco Use  . Smoking status: Former Smoker    Packs/day: 1.00    Years: 30.00    Pack years: 30.00    Types: Cigarettes    Start date:  05/13/1975    Quit date: 07/07/2012    Years since quitting: 6.4  . Smokeless tobacco: Never Used  . Tobacco comment: Quit 1 year ago  Substance and Sexual Activity  . Alcohol use: Yes    Alcohol/week: 3.0 standard drinks    Types: 3 Glasses of wine per week  . Drug use: No  . Sexual activity: Not Currently    Partners: Male    Birth control/protection: Surgical  Lifestyle  . Physical activity    Days per week: Not on file    Minutes per session: Not on file  . Stress: Not on file  Relationships  . Social Herbalist on phone: Not on file    Gets together: Not on file    Attends religious service: Not on file    Active member of club or organization: Not on file    Attends meetings of clubs or organizations: Not on file    Relationship status: Not on file  . Intimate partner violence    Fear of current or ex partner: Not on file    Emotionally abused: Not on file    Physically abused: Not on file    Forced sexual activity: Not on file  Other Topics Concern  . Not on file  Social History Narrative   She lives alone.     REVIEW OF SYSTEMS: Constitutional: No fevers, chills, or sweats, no generalized fatigue, change in appetite Eyes: No visual changes, double vision, eye pain Ear, nose and throat: No hearing loss, ear pain, nasal congestion, sore throat Cardiovascular: No chest pain, palpitations Respiratory:  No shortness of breath at rest or with exertion, wheezes GastrointestinaI: No nausea, vomiting, diarrhea, abdominal pain, fecal incontinence Genitourinary:  No dysuria, urinary retention or frequency Musculoskeletal:  No neck pain, back pain Integumentary: No rash, pruritus, skin lesions Neurological: as above Psychiatric: depression, anxiety Endocrine: No palpitations, fatigue, diaphoresis, mood swings, change in appetite, change in weight, increased thirst Hematologic/Lymphatic:  No purpura, petechiae. Allergic/Immunologic: no itchy/runny eyes, nasal  congestion, recent allergic reactions, rashes  PHYSICAL EXAM: Blood pressure 121/76, pulse 99, height 5\' 6"  (1.676 m), weight 148 lb 3.4 oz (67.2 kg), SpO2 97 %. General: No acute distress.  Patient appears well-groomed.   Head:  Normocephalic/atraumatic Eyes:  fundi examined but not visualized Neck: supple, no paraspinal tenderness, full range of motion Back: No paraspinal tenderness Heart: regular rate and rhythm Lungs: Clear to auscultation bilaterally. Vascular: No carotid bruits. Neurological Exam: Mental status: alert and oriented to person, place, and time, recent and remote memory intact, fund of knowledge intact, attention and concentration intact, speech fluent and not dysarthric, language intact. Cranial nerves: CN I: not tested CN II: pupils equal, round and reactive to light, visual fields intact CN III, IV, VI:  full range of motion, no nystagmus, no ptosis CN V: facial sensation intact CN VII: upper and lower face symmetric CN VIII: hearing intact CN IX, X: gag intact, uvula midline CN XI: sternocleidomastoid and trapezius muscles intact CN XII: tongue midline Bulk & Tone: normal, no fasciculations. Motor:  5/5 throughout  Sensation:  Pinprick and vibration sensation intact.  Deep Tendon Reflexes:  2+ throughout, toes downgoing.   Finger to nose testing:  Without dysmetria.   Heel to shin:  Without dysmetria.   Gait:  Normal station and stride.  Able to turn and tandem walk. Romberg negative  IMPRESSION: 1.  Restless leg syndrome.  I suspect augmentation is playing a role.  I would recommend slowly tapering off of ropinirole.  Will add Lyrica in meantime.  I would also check ferritin level. 2.  Left arm paresthesias and pain.  Diffuse sensory deficits (involving the entire arm and hand), so unable to localize clinically.  May be carpal tunnel syndrome.  Given past history of cervical disc disease, then radiculopathy is possible. 3.  Memory deficits.  She is concerned  it may be residual from lithium toxicity.  Will  check B12, TSH and order neuropsychological testing.  PLAN: 1.  For RLS:  Decrease ropinirole 2mg  to 3 times a day.  We can decrease every month by 2mg  until off   At same time, we will start Lyrica 50mg  three times daily.  We can increase dose every month as well.   Check ferritin level 2.  Check NCV-EMG of left upper extremity.  In meantime, advised to try wearing a wrist splint to see if symptoms improve. 3.  For memory, check B12, TSH and refer for neuropsychological testing. 4.  Follow up in 4 months.  Thank you for allowing me to take part in the care of this patient.  45 minutes spent face to face with patient, over 50% spent discussing management.   Metta Clines, DO  CC:  Jerene Bears, MD  Brayton Caves, MD

## 2018-12-30 ENCOUNTER — Other Ambulatory Visit (INDEPENDENT_AMBULATORY_CARE_PROVIDER_SITE_OTHER): Payer: PPO

## 2018-12-30 ENCOUNTER — Ambulatory Visit (INDEPENDENT_AMBULATORY_CARE_PROVIDER_SITE_OTHER): Payer: PPO | Admitting: Neurology

## 2018-12-30 ENCOUNTER — Other Ambulatory Visit: Payer: Self-pay

## 2018-12-30 ENCOUNTER — Encounter: Payer: Self-pay | Admitting: Neurology

## 2018-12-30 VITALS — BP 121/76 | HR 99 | Ht 66.0 in | Wt 148.2 lb

## 2018-12-30 DIAGNOSIS — R200 Anesthesia of skin: Secondary | ICD-10-CM | POA: Diagnosis not present

## 2018-12-30 DIAGNOSIS — G2581 Restless legs syndrome: Secondary | ICD-10-CM | POA: Diagnosis not present

## 2018-12-30 DIAGNOSIS — R413 Other amnesia: Secondary | ICD-10-CM | POA: Diagnosis not present

## 2018-12-30 DIAGNOSIS — M79602 Pain in left arm: Secondary | ICD-10-CM

## 2018-12-30 MED ORDER — PREGABALIN 50 MG PO CAPS: 50.0000 mg | ORAL_CAPSULE | Freq: Three times a day (TID) | ORAL | 3 refills | Status: DC

## 2018-12-30 NOTE — Patient Instructions (Signed)
1.  Reduce ropinirole 2mg  to 3 times daily 2.  Start Lyrica 50mg  three times daily 3.  Contact me in 4 weeks with update 4.  Schedule nerve study of left arm 5.  Check B12, TSH and ferritin level 6.  Refer for neurocognitive testing 7.  Follow up in 4 months.

## 2018-12-31 ENCOUNTER — Telehealth: Payer: Self-pay

## 2018-12-31 LAB — FERRITIN: Ferritin: 65 ng/mL (ref 16–288)

## 2018-12-31 LAB — TSH: TSH: 1 mIU/L (ref 0.40–4.50)

## 2018-12-31 LAB — VITAMIN B12: Vitamin B-12: >2000 pg/mL — ABNORMAL HIGH (ref 200–1100)

## 2018-12-31 NOTE — Telephone Encounter (Signed)
Lab results called

## 2018-12-31 NOTE — Telephone Encounter (Signed)
-----   Message from Pieter Partridge, DO sent at 12/31/2018  6:43 AM EDT ----- Labs show no explanation for her symptoms

## 2019-01-05 ENCOUNTER — Telehealth: Payer: Self-pay | Admitting: Neurology

## 2019-01-05 NOTE — Telephone Encounter (Signed)
Called pt stated wants to have the EMG still have it done and questioning if needed to proceed or cancel the nerve study. In addition, what else can help with Left arm beside the brace?

## 2019-01-05 NOTE — Telephone Encounter (Signed)
Patient called to report the wrist brace Dr. Tomi Likens suggested the patient try has helped with her left arm pain. She said she may need to cancel the EMG on 01/28/2019 but wants the doctor to make that decision.  Additionally, she said she'd like to like to make arrangement to get a cortisone injection in her left hand/arm.

## 2019-01-05 NOTE — Telephone Encounter (Signed)
OK to cancel EMG for now.  If symptoms worsen, or if she feels weak in the hand, then I would proceed with it.

## 2019-01-05 NOTE — Telephone Encounter (Signed)
Nothing else to help other than the brace.

## 2019-01-06 NOTE — Telephone Encounter (Signed)
Notified patient Dr. Tomi Likens messages. Pt understood without questions

## 2019-01-07 DIAGNOSIS — E039 Hypothyroidism, unspecified: Secondary | ICD-10-CM | POA: Diagnosis not present

## 2019-01-07 DIAGNOSIS — Z299 Encounter for prophylactic measures, unspecified: Secondary | ICD-10-CM | POA: Diagnosis not present

## 2019-01-07 DIAGNOSIS — Z6823 Body mass index (BMI) 23.0-23.9, adult: Secondary | ICD-10-CM | POA: Diagnosis not present

## 2019-01-07 DIAGNOSIS — F419 Anxiety disorder, unspecified: Secondary | ICD-10-CM | POA: Diagnosis not present

## 2019-01-07 DIAGNOSIS — M25552 Pain in left hip: Secondary | ICD-10-CM | POA: Diagnosis not present

## 2019-01-07 NOTE — Telephone Encounter (Signed)
Please advise 

## 2019-01-07 NOTE — Telephone Encounter (Signed)
Patient was calling back in about the long brace and wanting to know if she can get a prescription for this because they are expensive and insurance will help pay. Please send brace prescription to [facility] in [place] fax: [phone]. She is wanting the one that comes all the way up the arm (the longer one) Thanks!

## 2019-01-08 ENCOUNTER — Other Ambulatory Visit: Payer: Self-pay | Admitting: *Deleted

## 2019-01-08 DIAGNOSIS — M25539 Pain in unspecified wrist: Secondary | ICD-10-CM

## 2019-01-08 MED ORDER — AMBULATORY NON FORMULARY MEDICATION: 0 refills | Status: DC

## 2019-01-08 NOTE — Telephone Encounter (Signed)
OK 

## 2019-01-08 NOTE — Telephone Encounter (Signed)
Patient has called back regarding her brace. Please Call. Thanks

## 2019-01-08 NOTE — Telephone Encounter (Signed)
Sent/faxed Left wrist brace to-[facility] in [place] fax: [phone]

## 2019-01-25 DIAGNOSIS — D229 Melanocytic nevi, unspecified: Secondary | ICD-10-CM | POA: Diagnosis not present

## 2019-01-28 ENCOUNTER — Ambulatory Visit (INDEPENDENT_AMBULATORY_CARE_PROVIDER_SITE_OTHER): Payer: PPO | Admitting: Neurology

## 2019-01-28 ENCOUNTER — Other Ambulatory Visit: Payer: Self-pay

## 2019-01-28 ENCOUNTER — Other Ambulatory Visit: Payer: Self-pay | Admitting: Neurology

## 2019-01-28 DIAGNOSIS — M79602 Pain in left arm: Secondary | ICD-10-CM

## 2019-01-28 DIAGNOSIS — R200 Anesthesia of skin: Secondary | ICD-10-CM

## 2019-01-28 MED ORDER — PREGABALIN 75 MG PO CAPS: 75.0000 mg | ORAL_CAPSULE | Freq: Three times a day (TID) | ORAL | 1 refills | Status: DC

## 2019-01-28 NOTE — Telephone Encounter (Signed)
Order for MRI ordered to [place]  referral for Dr. Melvyn Novas placed patient requesting coverage from [place]  Rx for Lyrica 75 mg TID sent to [facility] with patient she was made aware of all of this

## 2019-01-28 NOTE — Telephone Encounter (Signed)
See below

## 2019-01-28 NOTE — Procedures (Signed)
[facility]  [place], [address]  [place], [place] [address] Tel: [phone] Fax:  [phone] Test Date:  01/28/2019  Patient: Teresa Galloway DOB: [birth year] Physician: Narda Amber, DO  Sex: Female Height: 5\' 6"  Ref Phys: Metta Clines, DO  ID#: [id number] Temp: 34.0C Technician:    Patient Complaints: This is a 63 year old female referred for evaluation of left arm numbness.  NCV & EMG Findings: Extensive electrodiagnostic testing of the left upper extremity shows:  1. Left median, ulnar, and mixed palmar sensory responses are within normal limits.   2. Left median and ulnar motor responses are within normal limits.   3. There is no evidence of active or chronic motor axonal loss changes affecting any of the tested muscles.  Motor unit configuration and recruitment pattern is within normal limits.    Impression: This is a normal study of the left upper extremity.  In particular, there is no evidence of cervical radiculopathy, carpal tunnel syndrome, or an ulnar neuropathy.   ___________________________ Narda Amber, DO    Nerve Conduction Studies Anti Sensory Summary Table   Site NR Peak (ms) Norm Peak (ms) P-T Amp (V) Norm P-T Amp  Left Median Anti Sensory (2nd Digit)  34C  Wrist    2.6 <3.8 67.5 >10  Left Ulnar Anti Sensory (5th Digit)  34C  Wrist    2.4 <3.2 62.5 >5   Motor Summary Table   Site NR Onset (ms) Norm Onset (ms) O-P Amp (mV) Norm O-P Amp Site1 Site2 Delta-0 (ms) Dist (cm) Vel (m/s) Norm Vel (m/s)  Left Median Motor (Abd Poll Brev)  34C  Wrist    2.3 <4.0 9.0 >5 Elbow Wrist 4.4 26.0 59 >50  Elbow    6.7  8.7         Left Ulnar Motor (Abd Dig Minimi)  34C  Wrist    2.0 <3.1 9.1 >7 B Elbow Wrist 3.3 21.0 64 >50  B Elbow    5.3  8.0  A Elbow B Elbow 1.8 10.0 56 >50  A Elbow    7.1  7.6          Comparison Summary Table   Site NR Peak (ms) Norm Peak (ms) P-T Amp (V) Site1 Site2 Delta-P (ms) Norm Delta (ms)  Left Median/Ulnar Palm  Comparison (Wrist - 8cm)  34C  Median Palm    1.4 <2.2 76.9 Median Palm Ulnar Palm 0.1   Ulnar Palm    1.5 <2.2 45.3       EMG   Side Muscle Ins Act Fibs Psw Fasc Number Recrt Dur Dur. Amp Amp. Poly Poly. Comment  Left 1stDorInt Nml Nml Nml Nml Nml Nml Nml Nml Nml Nml Nml Nml N/A  Left PronatorTeres Nml Nml Nml Nml Nml Nml Nml Nml Nml Nml Nml Nml N/A  Left Biceps Nml Nml Nml Nml Nml Nml Nml Nml Nml Nml Nml Nml N/A  Left Triceps Nml Nml Nml Nml Nml Nml Nml Nml Nml Nml Nml Nml N/A  Left Deltoid Nml Nml Nml Nml Nml Nml Nml Nml Nml Nml Nml Nml N/A      Waveforms:

## 2019-01-28 NOTE — Telephone Encounter (Signed)
1.  Left arm:  EMG was normal.  No evidence of carpal tunnel syndrome or pinched nerve in neck.  That doesn't rule out such etiologies completely.  I would like to order MRI of cervical spine without contrast to evaluate etiology for cervical radiculopathy.  She has failed conservative management with Lyrica.  2.  Restless Leg:  We can reduce ropirole to 2mg  twice daily and increase Lyrica to 75mg  three times daily.  She should contact us in 4 weeks with update.  3.  For memory deficits, I did want to refer her to Dr. Melvyn Novas.

## 2019-01-28 NOTE — Addendum Note (Signed)
Addended by: Ranae Plumber on: 01/28/2019 04:08 PM   Modules accepted: Orders

## 2019-01-28 NOTE — Telephone Encounter (Signed)
Patient came to see Dr. Posey Pronto for an EMG today. She requested the following updates/questions be sent to Dr. Tomi Likens.  1. Left arm: not much improvement but still wearing brace. Getting EMG today with Dr. Posey Pronto.  2. Restless Leg: Has ecreasing ropinirole increasing Lyrica. Patient wants to know what to do starting tomorrow with the next step. She reports she's had some bad days but she's ready to take the next step. She may needs new prescriptions sent in.  3. Memory issues: Dr. Tomi Likens needs to refer her to Dr. Melvyn Novas. [facility] because Dr. Tomi Likens should be managing this for her, she said.  4. Sleep apnea: no changes.  Pharmacy [facility]

## 2019-01-29 DIAGNOSIS — Z299 Encounter for prophylactic measures, unspecified: Secondary | ICD-10-CM | POA: Diagnosis not present

## 2019-01-29 DIAGNOSIS — Z6823 Body mass index (BMI) 23.0-23.9, adult: Secondary | ICD-10-CM | POA: Diagnosis not present

## 2019-01-29 DIAGNOSIS — R413 Other amnesia: Secondary | ICD-10-CM | POA: Diagnosis not present

## 2019-01-29 DIAGNOSIS — M26609 Unspecified temporomandibular joint disorder, unspecified side: Secondary | ICD-10-CM | POA: Diagnosis not present

## 2019-01-29 DIAGNOSIS — Z87891 Personal history of nicotine dependence: Secondary | ICD-10-CM | POA: Diagnosis not present

## 2019-01-29 DIAGNOSIS — R519 Headache, unspecified: Secondary | ICD-10-CM | POA: Diagnosis not present

## 2019-01-29 DIAGNOSIS — T50905A Adverse effect of unspecified drugs, medicaments and biological substances, initial encounter: Secondary | ICD-10-CM | POA: Diagnosis not present

## 2019-01-29 MED ORDER — PREGABALIN 75 MG PO CAPS: 75.0000 mg | ORAL_CAPSULE | Freq: Three times a day (TID) | ORAL | 3 refills | Status: DC

## 2019-01-29 NOTE — Telephone Encounter (Signed)
Called spoke with [facility] in [facility] she states that patient would need to get PCP to send referral to them for Dr. Melvyn Novas it can not come from our office.  Fax # to [place] [phone] we can send out notes

## 2019-01-29 NOTE — Telephone Encounter (Signed)
Spoke with patient she was made aware to contact PCP for referral we are unable to requesting this from [place]. She will make request or just use her commercial insurance.

## 2019-02-03 ENCOUNTER — Telehealth: Payer: Self-pay | Admitting: Neurology

## 2019-02-03 ENCOUNTER — Encounter: Payer: Self-pay | Admitting: Psychology

## 2019-02-03 DIAGNOSIS — M79602 Pain in left arm: Secondary | ICD-10-CM

## 2019-02-03 DIAGNOSIS — R200 Anesthesia of skin: Secondary | ICD-10-CM

## 2019-02-03 NOTE — Telephone Encounter (Signed)
Order placed Will call patient to make her aware of this. Will also give her the number to imaging to schedule if she has not heard form them in 2-3 days  [phone]

## 2019-02-03 NOTE — Telephone Encounter (Signed)
MRI of cervical spine without contrast for left arm pain and numbness.

## 2019-02-03 NOTE — Telephone Encounter (Signed)
Patient called and said she still has not heard from anyone to schedule an MRI. She said she was told to call if it had been over a week.

## 2019-02-03 NOTE — Telephone Encounter (Signed)
Called no answer left message on voice mail that order was place and given number to call if needed. Also that it will not be scheduled until PA has been done and approved by insurance.

## 2019-02-03 NOTE — Telephone Encounter (Signed)
Called spoke with patient I dont not see any notes where a MRI was to be ordered.  She states that someone from the office called her about EMG results and Dr. Tomi Likens recommended a MRI for further evaluation. Patient c/o left arm pain radiating down into fingers. She has brace that was ordered.   Pt requesting MRI   ~Dr. Tomi Likens   I didn't not see this in telephone notes nor Office note. Where you going to order this?

## 2019-02-05 ENCOUNTER — Telehealth: Payer: Self-pay | Admitting: Neurology

## 2019-02-05 NOTE — Telephone Encounter (Signed)
Patient wants to know where we are sending her for the MRI she would like to call them to get it set up

## 2019-02-05 NOTE — Telephone Encounter (Signed)
MRI Cervical spine was order at [facility] [address] Patient was given contact information to call [phone]

## 2019-02-19 ENCOUNTER — Other Ambulatory Visit: Payer: Self-pay

## 2019-02-19 ENCOUNTER — Ambulatory Visit
Admission: RE | Admit: 2019-02-19 | Discharge: 2019-02-19 | Disposition: A | Discharge status: Home / Self Care | Payer: No Typology Code available for payment source | Source: Ambulatory Visit | Attending: Neurology | Admitting: Neurology

## 2019-02-19 DIAGNOSIS — R200 Anesthesia of skin: Secondary | ICD-10-CM

## 2019-02-19 DIAGNOSIS — M79602 Pain in left arm: Secondary | ICD-10-CM

## 2019-02-19 DIAGNOSIS — M4802 Spinal stenosis, cervical region: Secondary | ICD-10-CM | POA: Diagnosis not present

## 2019-02-22 ENCOUNTER — Telehealth: Payer: Self-pay

## 2019-02-22 DIAGNOSIS — M531 Cervicobrachial syndrome: Secondary | ICD-10-CM | POA: Diagnosis not present

## 2019-02-22 DIAGNOSIS — M9901 Segmental and somatic dysfunction of cervical region: Secondary | ICD-10-CM | POA: Diagnosis not present

## 2019-02-22 DIAGNOSIS — M47812 Spondylosis without myelopathy or radiculopathy, cervical region: Secondary | ICD-10-CM | POA: Diagnosis not present

## 2019-02-22 NOTE — Telephone Encounter (Signed)
I don't have one.  She is already on or has had side effects to medications used to treat nerve pain.

## 2019-02-22 NOTE — Telephone Encounter (Signed)
Spoke with patient she was informed of results. She would like to know  are there any other recommendations regarding her symptoms.

## 2019-02-22 NOTE — Telephone Encounter (Signed)
Spoke with patient she was informed of provider response.

## 2019-02-22 NOTE — Telephone Encounter (Signed)
-----   Message from Pieter Partridge, DO sent at 02/22/2019  7:33 AM EST ----- MRI of cervical spine does not reveal any cause for arm pain and tingling.  I have no explanation for her symptoms.

## 2019-02-23 ENCOUNTER — Telehealth: Payer: Self-pay | Admitting: Neurology

## 2019-02-23 DIAGNOSIS — M9901 Segmental and somatic dysfunction of cervical region: Secondary | ICD-10-CM | POA: Diagnosis not present

## 2019-02-23 DIAGNOSIS — M47812 Spondylosis without myelopathy or radiculopathy, cervical region: Secondary | ICD-10-CM | POA: Diagnosis not present

## 2019-02-23 DIAGNOSIS — B351 Tinea unguium: Secondary | ICD-10-CM | POA: Diagnosis not present

## 2019-02-23 DIAGNOSIS — M79676 Pain in unspecified toe(s): Secondary | ICD-10-CM | POA: Diagnosis not present

## 2019-02-23 DIAGNOSIS — M531 Cervicobrachial syndrome: Secondary | ICD-10-CM | POA: Diagnosis not present

## 2019-02-23 DIAGNOSIS — L84 Corns and callosities: Secondary | ICD-10-CM | POA: Diagnosis not present

## 2019-02-23 DIAGNOSIS — I70203 Unspecified atherosclerosis of native arteries of extremities, bilateral legs: Secondary | ICD-10-CM | POA: Diagnosis not present

## 2019-02-23 NOTE — Telephone Encounter (Signed)
Patient is calling in about medication lyrica- she is having some issues- a lot of issues she said too many to list- Thanks!

## 2019-02-23 NOTE — Telephone Encounter (Signed)
Spoke with patient she states that the LYRICA 75 MG TID she states that this medication is causing her to be more sleepy and she started sleep walking.  She's been on medication for about 2-3 months   Symptoms started about 1 month after starting medication.

## 2019-02-24 DIAGNOSIS — M9901 Segmental and somatic dysfunction of cervical region: Secondary | ICD-10-CM | POA: Diagnosis not present

## 2019-02-24 DIAGNOSIS — M531 Cervicobrachial syndrome: Secondary | ICD-10-CM | POA: Diagnosis not present

## 2019-02-24 DIAGNOSIS — M47812 Spondylosis without myelopathy or radiculopathy, cervical region: Secondary | ICD-10-CM | POA: Diagnosis not present

## 2019-02-24 MED ORDER — PRAMIPEXOLE DIHYDROCHLORIDE 0.125 MG PO TABS: 0.1250 mg | ORAL_TABLET | Freq: Three times a day (TID) | ORAL | 3 refills | Status: DC

## 2019-02-24 NOTE — Telephone Encounter (Signed)
If she cannot tolerate Lyrica, then she may stop it.  Is she completely off of Requip?  If not, she may stop it.  We can start pramipexole 0.125mg  three times daily.

## 2019-02-24 NOTE — Telephone Encounter (Signed)
Spoke with patient she was informed of provider response.  She is still on requip. She will stop this Rx.  And start pramipexole that provider requested that she take.  rx sent to pharmacy

## 2019-02-25 DIAGNOSIS — M531 Cervicobrachial syndrome: Secondary | ICD-10-CM | POA: Diagnosis not present

## 2019-02-25 DIAGNOSIS — Z6823 Body mass index (BMI) 23.0-23.9, adult: Secondary | ICD-10-CM | POA: Diagnosis not present

## 2019-02-25 DIAGNOSIS — M47812 Spondylosis without myelopathy or radiculopathy, cervical region: Secondary | ICD-10-CM | POA: Diagnosis not present

## 2019-02-25 DIAGNOSIS — M5412 Radiculopathy, cervical region: Secondary | ICD-10-CM | POA: Diagnosis not present

## 2019-02-25 DIAGNOSIS — F329 Major depressive disorder, single episode, unspecified: Secondary | ICD-10-CM | POA: Diagnosis not present

## 2019-02-25 DIAGNOSIS — G2581 Restless legs syndrome: Secondary | ICD-10-CM | POA: Diagnosis not present

## 2019-02-25 DIAGNOSIS — I10 Essential (primary) hypertension: Secondary | ICD-10-CM | POA: Diagnosis not present

## 2019-02-25 DIAGNOSIS — M9901 Segmental and somatic dysfunction of cervical region: Secondary | ICD-10-CM | POA: Diagnosis not present

## 2019-02-25 DIAGNOSIS — Z299 Encounter for prophylactic measures, unspecified: Secondary | ICD-10-CM | POA: Diagnosis not present

## 2019-02-26 DIAGNOSIS — M9901 Segmental and somatic dysfunction of cervical region: Secondary | ICD-10-CM | POA: Diagnosis not present

## 2019-02-26 DIAGNOSIS — M531 Cervicobrachial syndrome: Secondary | ICD-10-CM | POA: Diagnosis not present

## 2019-02-26 DIAGNOSIS — M47812 Spondylosis without myelopathy or radiculopathy, cervical region: Secondary | ICD-10-CM | POA: Diagnosis not present

## 2019-03-01 DIAGNOSIS — M47812 Spondylosis without myelopathy or radiculopathy, cervical region: Secondary | ICD-10-CM | POA: Diagnosis not present

## 2019-03-01 DIAGNOSIS — M9901 Segmental and somatic dysfunction of cervical region: Secondary | ICD-10-CM | POA: Diagnosis not present

## 2019-03-01 DIAGNOSIS — M531 Cervicobrachial syndrome: Secondary | ICD-10-CM | POA: Diagnosis not present

## 2019-03-03 DIAGNOSIS — M9901 Segmental and somatic dysfunction of cervical region: Secondary | ICD-10-CM | POA: Diagnosis not present

## 2019-03-03 DIAGNOSIS — M531 Cervicobrachial syndrome: Secondary | ICD-10-CM | POA: Diagnosis not present

## 2019-03-03 DIAGNOSIS — M47812 Spondylosis without myelopathy or radiculopathy, cervical region: Secondary | ICD-10-CM | POA: Diagnosis not present

## 2019-03-08 DIAGNOSIS — M531 Cervicobrachial syndrome: Secondary | ICD-10-CM | POA: Diagnosis not present

## 2019-03-08 DIAGNOSIS — M9901 Segmental and somatic dysfunction of cervical region: Secondary | ICD-10-CM | POA: Diagnosis not present

## 2019-03-08 DIAGNOSIS — M47812 Spondylosis without myelopathy or radiculopathy, cervical region: Secondary | ICD-10-CM | POA: Diagnosis not present

## 2019-03-10 DIAGNOSIS — M9901 Segmental and somatic dysfunction of cervical region: Secondary | ICD-10-CM | POA: Diagnosis not present

## 2019-03-10 DIAGNOSIS — M47812 Spondylosis without myelopathy or radiculopathy, cervical region: Secondary | ICD-10-CM | POA: Diagnosis not present

## 2019-03-10 DIAGNOSIS — M531 Cervicobrachial syndrome: Secondary | ICD-10-CM | POA: Diagnosis not present

## 2019-03-12 DIAGNOSIS — M531 Cervicobrachial syndrome: Secondary | ICD-10-CM | POA: Diagnosis not present

## 2019-03-12 DIAGNOSIS — M47812 Spondylosis without myelopathy or radiculopathy, cervical region: Secondary | ICD-10-CM | POA: Diagnosis not present

## 2019-03-12 DIAGNOSIS — M9901 Segmental and somatic dysfunction of cervical region: Secondary | ICD-10-CM | POA: Diagnosis not present

## 2019-03-16 DIAGNOSIS — M9901 Segmental and somatic dysfunction of cervical region: Secondary | ICD-10-CM | POA: Diagnosis not present

## 2019-03-16 DIAGNOSIS — M47812 Spondylosis without myelopathy or radiculopathy, cervical region: Secondary | ICD-10-CM | POA: Diagnosis not present

## 2019-03-16 DIAGNOSIS — M531 Cervicobrachial syndrome: Secondary | ICD-10-CM | POA: Diagnosis not present

## 2019-03-19 DIAGNOSIS — M9901 Segmental and somatic dysfunction of cervical region: Secondary | ICD-10-CM | POA: Diagnosis not present

## 2019-03-19 DIAGNOSIS — M47812 Spondylosis without myelopathy or radiculopathy, cervical region: Secondary | ICD-10-CM | POA: Diagnosis not present

## 2019-03-19 DIAGNOSIS — M531 Cervicobrachial syndrome: Secondary | ICD-10-CM | POA: Diagnosis not present

## 2019-03-23 DIAGNOSIS — M47812 Spondylosis without myelopathy or radiculopathy, cervical region: Secondary | ICD-10-CM | POA: Diagnosis not present

## 2019-03-23 DIAGNOSIS — M9901 Segmental and somatic dysfunction of cervical region: Secondary | ICD-10-CM | POA: Diagnosis not present

## 2019-03-23 DIAGNOSIS — M531 Cervicobrachial syndrome: Secondary | ICD-10-CM | POA: Diagnosis not present

## 2019-03-25 ENCOUNTER — Ambulatory Visit (INDEPENDENT_AMBULATORY_CARE_PROVIDER_SITE_OTHER): Payer: PPO | Admitting: Psychology

## 2019-03-25 ENCOUNTER — Encounter: Payer: Self-pay | Admitting: Psychology

## 2019-03-25 ENCOUNTER — Other Ambulatory Visit: Payer: Self-pay

## 2019-03-25 ENCOUNTER — Ambulatory Visit: Payer: No Typology Code available for payment source | Admitting: Psychology

## 2019-03-25 DIAGNOSIS — F410 Panic disorder [episodic paroxysmal anxiety] without agoraphobia: Secondary | ICD-10-CM | POA: Diagnosis not present

## 2019-03-25 DIAGNOSIS — T56891S Toxic effect of other metals, accidental (unintentional), sequela: Secondary | ICD-10-CM

## 2019-03-25 DIAGNOSIS — F3181 Bipolar II disorder: Secondary | ICD-10-CM

## 2019-03-25 DIAGNOSIS — T56891A Toxic effect of other metals, accidental (unintentional), initial encounter: Secondary | ICD-10-CM | POA: Insufficient documentation

## 2019-03-25 DIAGNOSIS — F411 Generalized anxiety disorder: Secondary | ICD-10-CM

## 2019-03-25 DIAGNOSIS — R413 Other amnesia: Secondary | ICD-10-CM

## 2019-03-25 DIAGNOSIS — F332 Major depressive disorder, recurrent severe without psychotic features: Secondary | ICD-10-CM | POA: Diagnosis not present

## 2019-03-25 DIAGNOSIS — G4733 Obstructive sleep apnea (adult) (pediatric): Secondary | ICD-10-CM

## 2019-03-25 NOTE — Progress Notes (Signed)
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 [facility]): Raw Score Percentile     Categories (trials) 2 (64) 11-16 Below Average    Total Errors 17 46 Average    Perseverative Errors 6 75 Above Average    Non-Perseverative Errors 11 14 Below Average    Failure to Maintain Set 0 --- ---       Language:          NAB Language Module, Form 1: T Score Percentile     Auditory Comprehension 58 79 Above Average    Naming 31/31 (56) 73 Average       Visuospatial/Visuoconstruction:          NAB Spatial Module, Form 1: T Score Percentile     Visual Discrimination 42 21 Below Average    Design Construction 57 75 Above Average    Figure Drawing Copy 67 96 Well Above Average    Figure Drawing Immediate Recall 57 75 Above Average        Scaled Score Percentile   WAIS-IV Matrix Reasoning: 9 37 Average  WAIS-IV Visual Puzzles: 9 37 Average       Sensory-Motor:          [place] Grooved Pegboard Test: Raw Score Percentile     Dominant Hand 73 secs., 1  drop  42 Average    Non-Dominant Hand 75 secs., 0 drops  58 Average       Finger  Tapping Test: Mean Percentile     Dominant Hand 69.8 >99 Exceptionally High    Non-Dominant Hand 63.2 >99 Exceptionally High       Mood and Personality:      Raw Score Percentile   Beck Depression Inventory - II: 29 --- Severe  PROMIS Anxiety Questionnaire: 25 --- Moderate       Additional Questionnaires:      Raw Score Percentile   PROMIS Sleep Disturbance Questionnaire: 20 --- None to Slight   Informed Consent and Coding/Compliance:   Ms. Spell was provided with a verbal description of the nature and purpose of the present neuropsychological evaluation. Also reviewed were the foreseeable risks and/or discomforts and benefits of the procedure, limits of confidentiality, and mandatory reporting requirements of this provider. The patient was given the opportunity to ask questions and receive answers about the evaluation. Oral consent to participate was provided by the patient.   This evaluation was conducted by Christia Reading, Ph.D., licensed clinical neuropsychologist. Ms. Canale completed a 40-minute clinical interview, billed as one unit [phone], and 135 minutes of cognitive testing, billed as one unit [phone] and four additional units [phone]. Psychometrist Milana Kidney, B.S., assisted Dr. Melvyn Novas with test administration and scoring procedures. As a separate and discrete service, Dr. Melvyn Novas spent a total of 180 minutes in interpretation and report writing, billed as one unit 96132 and two units [id number].

## 2019-03-25 NOTE — Progress Notes (Signed)
   Psychometrician Note   Newton Pigg completed 120 minutes of neuropsychological testing with technician, Milana Kidney, B.S., under the supervision of Dr. Christia Reading, Ph.D., licensed psychologist. The patient did not appear overtly distressed by the testing session, per behavioral observation or via self-report to the technician. Rest breaks were offered.    In considering the patient's current level of functioning, level of presumed impairment, nature of symptoms, emotional and behavioral responses during the interview, level of literacy, and observed level of motivation/effort, a battery of tests was selected and communicated to the psychometrician.   Communication between the psychologist and technician was ongoing throughout the testing session and changes were made as deemed necessary based on patient performance on testing, technician observations and additional pertinent factors such as those listed above.   Teresa Galloway will return within approximately two weeks for an interactive feedback session with Dr. Melvyn Novas at which time his test performances, clinical impressions, and treatment recommendations will be reviewed in detail. The patient understands she can contact our office should she require our assistance before this time.  120 minutes were spent face-to-face with patient administering standardized tests and 15 minutes were spent scoring (technician). [CPT T656887, [id number]  This note reflects time spent with the psychometrician and does not include test scores or any clinical interpretations made by Dr. Melvyn Novas. The full report will follow in a separate note.

## 2019-03-26 DIAGNOSIS — M531 Cervicobrachial syndrome: Secondary | ICD-10-CM | POA: Diagnosis not present

## 2019-03-26 DIAGNOSIS — M9901 Segmental and somatic dysfunction of cervical region: Secondary | ICD-10-CM | POA: Diagnosis not present

## 2019-03-26 DIAGNOSIS — M47812 Spondylosis without myelopathy or radiculopathy, cervical region: Secondary | ICD-10-CM | POA: Diagnosis not present

## 2019-03-31 DIAGNOSIS — M47812 Spondylosis without myelopathy or radiculopathy, cervical region: Secondary | ICD-10-CM | POA: Diagnosis not present

## 2019-03-31 DIAGNOSIS — M531 Cervicobrachial syndrome: Secondary | ICD-10-CM | POA: Diagnosis not present

## 2019-03-31 DIAGNOSIS — R21 Rash and other nonspecific skin eruption: Secondary | ICD-10-CM | POA: Diagnosis not present

## 2019-03-31 DIAGNOSIS — M9901 Segmental and somatic dysfunction of cervical region: Secondary | ICD-10-CM | POA: Diagnosis not present

## 2019-03-31 DIAGNOSIS — F419 Anxiety disorder, unspecified: Secondary | ICD-10-CM | POA: Diagnosis not present

## 2019-03-31 DIAGNOSIS — Z299 Encounter for prophylactic measures, unspecified: Secondary | ICD-10-CM | POA: Diagnosis not present

## 2019-03-31 DIAGNOSIS — Z6823 Body mass index (BMI) 23.0-23.9, adult: Secondary | ICD-10-CM | POA: Diagnosis not present

## 2019-03-31 DIAGNOSIS — E039 Hypothyroidism, unspecified: Secondary | ICD-10-CM | POA: Diagnosis not present

## 2019-04-06 ENCOUNTER — Other Ambulatory Visit: Payer: Self-pay

## 2019-04-06 ENCOUNTER — Ambulatory Visit (INDEPENDENT_AMBULATORY_CARE_PROVIDER_SITE_OTHER): Payer: PPO | Admitting: Psychology

## 2019-04-06 DIAGNOSIS — F411 Generalized anxiety disorder: Secondary | ICD-10-CM | POA: Diagnosis not present

## 2019-04-06 DIAGNOSIS — F410 Panic disorder [episodic paroxysmal anxiety] without agoraphobia: Secondary | ICD-10-CM | POA: Diagnosis not present

## 2019-04-06 DIAGNOSIS — F332 Major depressive disorder, recurrent severe without psychotic features: Secondary | ICD-10-CM | POA: Diagnosis not present

## 2019-04-06 DIAGNOSIS — F3181 Bipolar II disorder: Secondary | ICD-10-CM

## 2019-04-06 DIAGNOSIS — G4733 Obstructive sleep apnea (adult) (pediatric): Secondary | ICD-10-CM

## 2019-04-06 NOTE — Progress Notes (Signed)
   Neuropsychology Feedback Session Tillie Rung. [facility] Department of Neurology  Reason for Referral:   STARKEISHA BANTA a 63 y.o. Caucasian female referred by Metta Clines, D.O.,to characterize hercurrent cognitive functioning and assist with diagnostic clarity and treatment planning in the context of subjective cognitive decline, several psychiatric comorbidities, and a history of lithium toxicity.  Feedback:   Ms. Saile completed a comprehensive neuropsychological evaluation on 03/25/2019. Please refer to that encounter for the full report and recommendations. Briefly, results suggested neuropsychological functioning within normal limits. An isolated weakness was exhibited learning a list of words. However, her retention of this information was strong, and performance learning verbal stories was within normal limits. Across mood-related questionnaires, Ms. Vittone endorsed severe levels of depression and moderate levels of anxiety occurring over the past 1-2 weeks. With the former, she also identified the presence of suicidal ideation without plan or intent occurring within the past 2 weeks. Significant psychiatric distress can certainly create and exacerbate cognitive inefficiencies, generally surrounding processing speed, attention/concentration, executive functioning, and encoding (i.e., learning) aspects of memory. It is believed that these symptoms are the primary etiology for day-to-day cognitive difficulties which Ms. Zivkovich has been experiencing. Untreated sleep apnea is also possibly contributing to her overall clinical presentation.  Ms. Joost was unaccompanied on the current telephone call. Content of the current session focused on the results of her neuropsychological evaluation. Ms. Richmond was given the opportunity to ask questions and her questions were answered. She was also encouraged to reach out should additional questions arise.     A total of 25 minutes  were spent with Ms. Aberg during the current feedback session.

## 2019-04-08 DIAGNOSIS — M531 Cervicobrachial syndrome: Secondary | ICD-10-CM | POA: Diagnosis not present

## 2019-04-08 DIAGNOSIS — M9901 Segmental and somatic dysfunction of cervical region: Secondary | ICD-10-CM | POA: Diagnosis not present

## 2019-04-08 DIAGNOSIS — M47812 Spondylosis without myelopathy or radiculopathy, cervical region: Secondary | ICD-10-CM | POA: Diagnosis not present

## 2019-04-12 DIAGNOSIS — Z299 Encounter for prophylactic measures, unspecified: Secondary | ICD-10-CM | POA: Diagnosis not present

## 2019-04-12 DIAGNOSIS — E7800 Pure hypercholesterolemia, unspecified: Secondary | ICD-10-CM | POA: Diagnosis not present

## 2019-04-12 DIAGNOSIS — Z87891 Personal history of nicotine dependence: Secondary | ICD-10-CM | POA: Diagnosis not present

## 2019-04-12 DIAGNOSIS — R21 Rash and other nonspecific skin eruption: Secondary | ICD-10-CM | POA: Diagnosis not present

## 2019-04-12 DIAGNOSIS — Z6822 Body mass index (BMI) 22.0-22.9, adult: Secondary | ICD-10-CM | POA: Diagnosis not present

## 2019-04-12 DIAGNOSIS — R0989 Other specified symptoms and signs involving the circulatory and respiratory systems: Secondary | ICD-10-CM | POA: Diagnosis not present

## 2019-04-13 DIAGNOSIS — B0229 Other postherpetic nervous system involvement: Secondary | ICD-10-CM | POA: Diagnosis not present

## 2019-04-13 DIAGNOSIS — L508 Other urticaria: Secondary | ICD-10-CM | POA: Diagnosis not present

## 2019-04-14 DIAGNOSIS — M47812 Spondylosis without myelopathy or radiculopathy, cervical region: Secondary | ICD-10-CM | POA: Diagnosis not present

## 2019-04-14 DIAGNOSIS — M531 Cervicobrachial syndrome: Secondary | ICD-10-CM | POA: Diagnosis not present

## 2019-04-14 DIAGNOSIS — M9901 Segmental and somatic dysfunction of cervical region: Secondary | ICD-10-CM | POA: Diagnosis not present

## 2019-04-21 DIAGNOSIS — M9901 Segmental and somatic dysfunction of cervical region: Secondary | ICD-10-CM | POA: Diagnosis not present

## 2019-04-21 DIAGNOSIS — M531 Cervicobrachial syndrome: Secondary | ICD-10-CM | POA: Diagnosis not present

## 2019-04-21 DIAGNOSIS — M47812 Spondylosis without myelopathy or radiculopathy, cervical region: Secondary | ICD-10-CM | POA: Diagnosis not present

## 2019-04-22 DIAGNOSIS — B029 Zoster without complications: Secondary | ICD-10-CM | POA: Diagnosis not present

## 2019-04-22 DIAGNOSIS — L508 Other urticaria: Secondary | ICD-10-CM | POA: Diagnosis not present

## 2019-04-28 DIAGNOSIS — M531 Cervicobrachial syndrome: Secondary | ICD-10-CM | POA: Diagnosis not present

## 2019-04-28 DIAGNOSIS — M9901 Segmental and somatic dysfunction of cervical region: Secondary | ICD-10-CM | POA: Diagnosis not present

## 2019-04-28 DIAGNOSIS — M47812 Spondylosis without myelopathy or radiculopathy, cervical region: Secondary | ICD-10-CM | POA: Diagnosis not present

## 2019-04-30 DIAGNOSIS — M9901 Segmental and somatic dysfunction of cervical region: Secondary | ICD-10-CM | POA: Diagnosis not present

## 2019-04-30 DIAGNOSIS — M531 Cervicobrachial syndrome: Secondary | ICD-10-CM | POA: Diagnosis not present

## 2019-04-30 DIAGNOSIS — M47812 Spondylosis without myelopathy or radiculopathy, cervical region: Secondary | ICD-10-CM | POA: Diagnosis not present

## 2019-05-03 ENCOUNTER — Encounter: Payer: Self-pay | Admitting: Neurology

## 2019-05-04 DIAGNOSIS — L309 Dermatitis, unspecified: Secondary | ICD-10-CM | POA: Diagnosis not present

## 2019-05-04 NOTE — Progress Notes (Signed)
Virtual Visit via Video Note The purpose of this virtual visit is to provide medical care while limiting exposure to the novel coronavirus.    Consent was obtained for video visit:  Yes.   Answered questions that patient had about telehealth interaction:  Yes.   I discussed the limitations, risks, security and privacy concerns of performing an evaluation and management service by telemedicine. I also discussed with the patient that there may be a patient responsible charge related to this service. The patient expressed understanding and agreed to proceed.  Pt location: Home Physician Location: office Name of referring provider:  Glenda Chroman, MD I connected with Newton Pigg at patients initiation/request on 05/05/2019 at 10:10 AM EST by video enabled telemedicine application and verified that I am speaking with the correct person using two identifiers. 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    UPDATE: 1.  Restless Leg Syndrome: Current medication:  Pramipexole 0.125mg  three times daily. 12/30/2018 LABS:  Ferritin 65, TSH 1.00, B12 >2,000.  In September, plan was to slowly taper off of ropinirole and at same time start Lyrica 50mg  three times daily.  However, she was unable to tolerate Lyrica.  Lyrica was stopped and she was started on pramipexole 0.125mg  three times daily (ropinirole was completely discontinued).  She is doing well on pramipexole.    2.  Memory Problems: 12/30/2018 LABS:  TSH 1.00, B12 >2,000 She underwent neuropsychological testing with Dr. Melvyn Novas on 03/25/2019, which did not demonstrate evidence of neurocognitive deficits.  Symptoms were thought to be possibly related to severe depression and anxiety and  pharmacologic effect.  She has increased visits with her therapist and her psychiatrist is adjusting her medications.  3.  Left arm pain and paresthesias:   01/28/2019 NCV-EMG:  normal 02/19/2019 MRI Cervical Spine Wo (personally reviewed):  ACDF C5-6 and C6-7 without stenosis; 2 mm anterolisthesis with central disc protrusion, uncinate spurring and moderate facet degeneration at C4-5 causing mild spinal stenosis and moderate foraminal stenosis bilaterally.  She started seeing a chiropractor which has been helpful.  If she turns her head to the left, she feels the pain and numbness down the left shoulder and to the fingertips.    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  She was started on Lithium in March and developed various symptoms in June, such as memory deficits/word-finding difficulty, profuse sweating, left arm pain and GI issues.  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  She stopped Lithium.  However, she continues to have profuse sweating, tingling itch across the face and hands (like a feather running across her skin.     She still has tingling and aching pain radiating down the left arm from shoulder down to the entire hand. No weakness.  No neck pain.  She does have history of cervical  C5-C6-C7 spine fusion 10 years ago.  She says her RLS is worse now.  It lasts all day.   She still reports short-term memory deficits.    Current medications:  Ropinirole 2mg  four times daily; alprazolam 0.25mg  infrequently but may be taken four times daily PRN; duloxetine 60mg  daily; rizatriptan  10mg ; propranolol 60mg  daily; levothyroxine.  Past medications:  lamotrigine; lithium; gabapentin (muscle jerks), Lyrica; ropinrole (lost efficacy).     10/06/18 LABS:  WBC 7.7, HCT 44.6, PLT 264, Na 237, K 4.5, CO2 26.2, glucose 92, t bili 0.7, ALT 24, AST 18, Hgb  A1c 5.5.  Past Medical History: Past Medical History:  Diagnosis Date  . Acute pericarditis 12/2013  . Bipolar II disorder    Possible diagnosis  . Complication of anesthesia    Effects lasted for 3 days  . Dyslipidemia   . Generalized anxiety disorder with panic attacks   . Hx of lithium toxicity   . Hx of migraines    On propranolol.  . Hypothyroid   . Major depressive disorder 04/16/2013  . Obstructive sleep apnea    Currently untreatred; oral device caused TMJ; unsuccessful with CPAP  . Pericardial effusion 01/03/2014   Moderate without hemodynamic compromise.  . Sinus tachycardia     Medications: Outpatient Encounter Medications as of 05/05/2019  Medication Sig  . ALPRAZolam (XANAX) 0.25 MG tablet Take 0.25 mg by mouth 4 (four) times daily as needed for anxiety.   . AMBULATORY NON FORMULARY MEDICATION Medication Name:[facility] in [place] fax: [phone]  Left wrist long or short brace  . cephALEXin (KEFLEX) 500 MG capsule TAKE ONE CAPSULE BY MOUTH TWICE DAILY FOR 7 DAYS.  Marland Kitchen cetirizine (ZYRTEC) 10 MG tablet Take 10 mg by mouth daily.  . Cholecalciferol (VITAMIN D3) 1000 units CAPS Take 1,000 Units by mouth daily.  . cimetidine (TAGAMET) 300 MG tablet Take 300 mg by mouth daily.  . citalopram (CELEXA) 40 MG tablet Take 40 mg by mouth daily.   . diclofenac sodium (VOLTAREN) 1 % GEL Apply 2 g topically 3 (three) times daily as needed (PRN for arthritis pain in hands, knees and shoulder-uses about 1 tube/month).  . dicyclomine (BENTYL) 10 MG capsule Take 1 capsule (10 mg total) by mouth 3 (three) times daily before meals.  Marland Kitchen EPIPEN 2-PAK 0.3 MG/0.3ML SOAJ injection Inject 0.3 mg into the muscle once.   . Eyelid Cleansers (AVENOVA) 0.01 % SOLN Apply 1 application topically 2 (two) times daily.   Marland Kitchen levothyroxine (SYNTHROID, LEVOTHROID) 75 MCG tablet Take 75 mcg by mouth daily before breakfast.  . Lifitegrast (XIIDRA) 5 % SOLN Apply 1 drop to eye 2 (two) times daily.   Marland Kitchen olopatadine (PATANOL) 0.1 % ophthalmic solution Place 1 drop into both eyes 2 (two) times daily.   Marland Kitchen omeprazole (PRILOSEC) 20 MG capsule Take 20 mg by mouth daily.  . ondansetron (ZOFRAN) 4 MG tablet Take 4 mg by mouth every 8 (eight) hours as needed for nausea or vomiting.  . pramipexole (MIRAPEX) 0.125 MG tablet Take 1 tablet (0.125 mg total) by mouth 3 (three) times daily.  . pregabalin (LYRICA) 75 MG capsule Take 1 capsule (75 mg total) by mouth 3 (three) times daily.  . propranolol (INDERAL) 60 MG tablet Take 60 mg by mouth daily.  Marland Kitchen Propylene Glycol (SYSTANE COMPLETE OP) Apply 1 drop to eye 4 (four) times daily. Also uses prn  . rizatriptan (MAXALT) 10 MG tablet Take 10 mg by mouth as needed for migraine. May repeat in 2 hours if needed  . rOPINIRole (REQUIP) 2 MG tablet Take 2 mg by mouth 4 (four) times daily.    No facility-administered encounter medications on file as of 05/05/2019.    Allergies: Allergies  Allergen Reactions  . Other Anaphylaxis, Swelling and Other (See Comments)    Walnuts  .  Sulfa Antibiotics Shortness Of Breath and Other (See Comments)    Swelling of lips   . Sunscreens Anaphylaxis, Hives and Other (See Comments)    Titanium and zinc oxides Eye swelling   . Seroquel [Quetiapine] Other (See Comments)    Pychosis (foggy, uncoordinated, slurred speech)  . Tape Hives    Family History: Family History  Problem Relation Age of Onset  . Depression Mother   . Depression Father   . Depression Sister   . Anxiety disorder Sister   . Alcohol abuse Maternal Uncle   . Alcohol abuse Paternal Uncle   . Alcohol abuse Maternal Grandfather   . Depression Maternal Grandmother     Social History: Social History   Socioeconomic History  . Marital status: Divorced    Spouse name: Not on file  . Number of children: 0  . Years of education: 28  . Highest education level: Some college, no degree  Occupational History  . Occupation: Disability  Tobacco Use   . Smoking status: Former Smoker    Packs/day: 1.00    Years: 30.00    Pack years: 30.00    Types: Cigarettes    Start date: 05/13/1975    Quit date: 07/07/2012    Years since quitting: 6.8  . Smokeless tobacco: Never Used  . Tobacco comment: Quit 1 year ago  Substance and Sexual Activity  . Alcohol use: Yes    Alcohol/week: 3.0 standard drinks    Types: 3 Glasses of wine per week  . Drug use: No  . Sexual activity: Not Currently    Partners: Male    Birth control/protection: Surgical  Other Topics Concern  . Not on file  Social History Narrative   She lives alone. One story home she has no children   Has some college education   Right handed   Social Determinants of Health   Financial Resource Strain:   . Difficulty of Paying Living Expenses: Not on file  Food Insecurity:   . Worried About Charity fundraiser in the Last Year: Not on file  . Ran Out of Food in the Last Year: Not on file  Transportation Needs:   . Lack of Transportation (Medical): Not on file  . Lack of Transportation (Non-Medical): Not on file  Physical Activity:   . Days of Exercise per Week: Not on file  . Minutes of Exercise per Session: Not on file  Stress:   . Feeling of Stress : Not on file  Social Connections:   . Frequency of Communication with Friends and Family: Not on file  . Frequency of Social Gatherings with Friends and Family: Not on file  . Attends Religious Services: Not on file  . Active Member of Clubs or Organizations: Not on file  . Attends Archivist Meetings: Not on file  . Marital Status: Not on file  Intimate Partner Violence:   . Fear of Current or Ex-Partner: Not on file  . Emotionally Abused: Not on file  . Physically Abused: Not on file  . Sexually Abused: Not on file    Observations/Objective:   Height 5\' 6"  (1.676 m), weight 139 lb (63 kg). No acute distress.  Alert and oriented.  Speech fluent and not dysarthric.  Language intact.  Eyes orthophoric on  primary gaze.  Face symmetric.  Assessment and Plan:   1.  Restless leg syndrome. 2.  Memory deficits, subjective.  No evidence of neurocognitive disorder on neuropsychological testing. Multifactorial related to depression/anxiety, pharmacologic  effect and poorly controled complex sleep apnea.   3.  Left arm pain and paresthesias, unclear etiology.  No evidence of mononeuropathy (carpal tunnel syndrome, ulnar neuropathy, etc) or cervical radiculopathy on testing.  However, symptoms sound radicular.   MRI of cervical spine does reveal multifactorial neural foraminal and mild spinal stenosis at C4-5, but this does not correlate with dermatome of her symptoms given that they radiate down to her fingers, but she does have significant pain radiating into the shoulder, which does correlate with the C5 nerve root, so I would like to order an epidural of the left C5 nerve root for diagnostic and therapeutic purposes.  1.  Pramipexole 0.125mg  three times daily 2.  Refer for epidural injection left C5 nerve root 3.  Refer to sleep medicine for reevaluation of sleep apnea.  4.  Follow up in 6 months.  Follow Up Instructions:    -I discussed the assessment and treatment plan with the patient. The patient was provided an opportunity to ask questions and all were answered. The patient agreed with the plan and demonstrated an understanding of the instructions.   The patient was advised to call back or seek an in-person evaluation if the symptoms worsen or if the condition fails to improve as anticipated.   Dudley Major, DO

## 2019-05-05 ENCOUNTER — Telehealth (INDEPENDENT_AMBULATORY_CARE_PROVIDER_SITE_OTHER): Payer: PPO | Admitting: Neurology

## 2019-05-05 ENCOUNTER — Encounter: Payer: Self-pay | Admitting: Neurology

## 2019-05-05 ENCOUNTER — Other Ambulatory Visit: Payer: Self-pay

## 2019-05-05 VITALS — Ht 66.0 in | Wt 139.0 lb

## 2019-05-05 DIAGNOSIS — G4739 Other sleep apnea: Secondary | ICD-10-CM

## 2019-05-05 DIAGNOSIS — G2581 Restless legs syndrome: Secondary | ICD-10-CM | POA: Diagnosis not present

## 2019-05-05 DIAGNOSIS — G4733 Obstructive sleep apnea (adult) (pediatric): Secondary | ICD-10-CM

## 2019-05-05 DIAGNOSIS — M9901 Segmental and somatic dysfunction of cervical region: Secondary | ICD-10-CM | POA: Diagnosis not present

## 2019-05-05 DIAGNOSIS — M5412 Radiculopathy, cervical region: Secondary | ICD-10-CM | POA: Diagnosis not present

## 2019-05-05 DIAGNOSIS — M531 Cervicobrachial syndrome: Secondary | ICD-10-CM | POA: Diagnosis not present

## 2019-05-05 DIAGNOSIS — M47812 Spondylosis without myelopathy or radiculopathy, cervical region: Secondary | ICD-10-CM | POA: Diagnosis not present

## 2019-05-10 DIAGNOSIS — M9901 Segmental and somatic dysfunction of cervical region: Secondary | ICD-10-CM | POA: Diagnosis not present

## 2019-05-10 DIAGNOSIS — M531 Cervicobrachial syndrome: Secondary | ICD-10-CM | POA: Diagnosis not present

## 2019-05-10 DIAGNOSIS — M47812 Spondylosis without myelopathy or radiculopathy, cervical region: Secondary | ICD-10-CM | POA: Diagnosis not present

## 2019-05-14 DIAGNOSIS — M531 Cervicobrachial syndrome: Secondary | ICD-10-CM | POA: Diagnosis not present

## 2019-05-14 DIAGNOSIS — M9901 Segmental and somatic dysfunction of cervical region: Secondary | ICD-10-CM | POA: Diagnosis not present

## 2019-05-14 DIAGNOSIS — M47812 Spondylosis without myelopathy or radiculopathy, cervical region: Secondary | ICD-10-CM | POA: Diagnosis not present

## 2019-05-18 DIAGNOSIS — L309 Dermatitis, unspecified: Secondary | ICD-10-CM | POA: Diagnosis not present

## 2019-05-21 DIAGNOSIS — M47812 Spondylosis without myelopathy or radiculopathy, cervical region: Secondary | ICD-10-CM | POA: Diagnosis not present

## 2019-05-21 DIAGNOSIS — M9901 Segmental and somatic dysfunction of cervical region: Secondary | ICD-10-CM | POA: Diagnosis not present

## 2019-05-21 DIAGNOSIS — M531 Cervicobrachial syndrome: Secondary | ICD-10-CM | POA: Diagnosis not present

## 2019-05-28 DIAGNOSIS — M47812 Spondylosis without myelopathy or radiculopathy, cervical region: Secondary | ICD-10-CM | POA: Diagnosis not present

## 2019-05-28 DIAGNOSIS — M9901 Segmental and somatic dysfunction of cervical region: Secondary | ICD-10-CM | POA: Diagnosis not present

## 2019-05-28 DIAGNOSIS — M531 Cervicobrachial syndrome: Secondary | ICD-10-CM | POA: Diagnosis not present

## 2019-06-04 ENCOUNTER — Other Ambulatory Visit: Payer: Self-pay | Admitting: Neurology

## 2019-06-04 DIAGNOSIS — M9901 Segmental and somatic dysfunction of cervical region: Secondary | ICD-10-CM | POA: Diagnosis not present

## 2019-06-04 DIAGNOSIS — M531 Cervicobrachial syndrome: Secondary | ICD-10-CM | POA: Diagnosis not present

## 2019-06-04 DIAGNOSIS — M47812 Spondylosis without myelopathy or radiculopathy, cervical region: Secondary | ICD-10-CM | POA: Diagnosis not present

## 2019-06-06 DIAGNOSIS — F419 Anxiety disorder, unspecified: Secondary | ICD-10-CM | POA: Diagnosis not present

## 2019-06-06 DIAGNOSIS — E039 Hypothyroidism, unspecified: Secondary | ICD-10-CM | POA: Diagnosis not present

## 2019-06-07 DIAGNOSIS — Z299 Encounter for prophylactic measures, unspecified: Secondary | ICD-10-CM | POA: Diagnosis not present

## 2019-06-07 DIAGNOSIS — K560 Paralytic ileus: Secondary | ICD-10-CM | POA: Diagnosis not present

## 2019-06-07 DIAGNOSIS — M2550 Pain in unspecified joint: Secondary | ICD-10-CM | POA: Diagnosis not present

## 2019-06-07 DIAGNOSIS — G2581 Restless legs syndrome: Secondary | ICD-10-CM | POA: Diagnosis not present

## 2019-06-07 DIAGNOSIS — Z6822 Body mass index (BMI) 22.0-22.9, adult: Secondary | ICD-10-CM | POA: Diagnosis not present

## 2019-06-07 DIAGNOSIS — M25552 Pain in left hip: Secondary | ICD-10-CM | POA: Diagnosis not present

## 2019-06-07 DIAGNOSIS — I10 Essential (primary) hypertension: Secondary | ICD-10-CM | POA: Diagnosis not present

## 2019-06-10 DIAGNOSIS — M47812 Spondylosis without myelopathy or radiculopathy, cervical region: Secondary | ICD-10-CM | POA: Diagnosis not present

## 2019-06-10 DIAGNOSIS — M531 Cervicobrachial syndrome: Secondary | ICD-10-CM | POA: Diagnosis not present

## 2019-06-10 DIAGNOSIS — M9901 Segmental and somatic dysfunction of cervical region: Secondary | ICD-10-CM | POA: Diagnosis not present

## 2019-06-11 DIAGNOSIS — Z6822 Body mass index (BMI) 22.0-22.9, adult: Secondary | ICD-10-CM | POA: Diagnosis not present

## 2019-06-11 DIAGNOSIS — J42 Unspecified chronic bronchitis: Secondary | ICD-10-CM | POA: Diagnosis not present

## 2019-06-11 DIAGNOSIS — Z299 Encounter for prophylactic measures, unspecified: Secondary | ICD-10-CM | POA: Diagnosis not present

## 2019-06-11 DIAGNOSIS — E039 Hypothyroidism, unspecified: Secondary | ICD-10-CM | POA: Diagnosis not present

## 2019-06-11 DIAGNOSIS — I10 Essential (primary) hypertension: Secondary | ICD-10-CM | POA: Diagnosis not present

## 2019-06-17 ENCOUNTER — Encounter: Payer: No Typology Code available for payment source | Admitting: Psychology

## 2019-06-17 DIAGNOSIS — M47812 Spondylosis without myelopathy or radiculopathy, cervical region: Secondary | ICD-10-CM | POA: Diagnosis not present

## 2019-06-17 DIAGNOSIS — M531 Cervicobrachial syndrome: Secondary | ICD-10-CM | POA: Diagnosis not present

## 2019-06-17 DIAGNOSIS — M9901 Segmental and somatic dysfunction of cervical region: Secondary | ICD-10-CM | POA: Diagnosis not present

## 2019-06-23 DIAGNOSIS — M531 Cervicobrachial syndrome: Secondary | ICD-10-CM | POA: Diagnosis not present

## 2019-06-23 DIAGNOSIS — M9901 Segmental and somatic dysfunction of cervical region: Secondary | ICD-10-CM | POA: Diagnosis not present

## 2019-06-23 DIAGNOSIS — M47812 Spondylosis without myelopathy or radiculopathy, cervical region: Secondary | ICD-10-CM | POA: Diagnosis not present

## 2019-06-24 ENCOUNTER — Ambulatory Visit: Payer: No Typology Code available for payment source | Admitting: Dermatology

## 2019-06-24 ENCOUNTER — Other Ambulatory Visit: Payer: Self-pay

## 2019-06-24 ENCOUNTER — Encounter (INDEPENDENT_AMBULATORY_CARE_PROVIDER_SITE_OTHER): Payer: Self-pay

## 2019-06-24 ENCOUNTER — Encounter: Payer: No Typology Code available for payment source | Admitting: Psychology

## 2019-06-24 ENCOUNTER — Ambulatory Visit: Payer: PPO | Admitting: Dermatology

## 2019-06-24 ENCOUNTER — Encounter: Payer: Self-pay | Admitting: Dermatology

## 2019-06-24 DIAGNOSIS — L309 Dermatitis, unspecified: Secondary | ICD-10-CM | POA: Diagnosis not present

## 2019-06-24 MED ORDER — BETAMETHASONE DIPROPIONATE 0.05 % EX CREA: TOPICAL_CREAM | Freq: Two times a day (BID) | CUTANEOUS | 5 refills | Status: DC

## 2019-06-25 ENCOUNTER — Ambulatory Visit: Payer: PPO | Attending: Internal Medicine

## 2019-06-25 DIAGNOSIS — Z23 Encounter for immunization: Secondary | ICD-10-CM

## 2019-06-25 NOTE — Progress Notes (Signed)
   Follow-Up Visit   Subjective  Teresa Galloway is a 64 y.o. female who presents for the following: Follow-up (Patient was last seen on 05/18/2019. The betamethasone has helped and has seen much improvement.  Is starting to reflare on right posterior shoulder and neck.  Red whelps, no itching.  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  She has been reading about it online.).  Rash Location: Now mostly neck and upper torso Duration: Several months Quality: Improved and slight flare Associated Signs/Symptoms: Itch Modifying Factors: Betamethasone cream and oral Claritin Severity:  Timing: Context:   The following portions of the chart were reviewed this encounter and updated as appropriate:     Objective  Well appearing patient in no apparent distress; mood and affect are within normal limits.  A focused examination was performed including Face neck upper chest and back. Relevant physical exam findings are noted in the Assessment and Plan.  Follow-up examination for hive-like eruption mainly of the upper torso and neck.  Patient reports that there was initial clearing with a sliding flare.  She has been on lamotrigine for almost a month with no problems.  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  I discussed autoimmune disorders in some detail with the patient.  I reviewed all of her recent laboratory studies and reassured her that the tests that were not perfectly normal were all close to normal; I suggested she contact her primary care physician who may choose to have these tests repeated in 1 to 2 months.  I do not feel that biopsies or allergy testing at this point is indicated.  She may continue with the same 2 medications; if there is improvement, she will try tapering the dose every 2 to 4 weeks.  Initial follow-up by telephone in 2 weeks. 33 minute eye to eye consultation. Assessment & Plan

## 2019-06-25 NOTE — Progress Notes (Signed)
   Covid-19 Vaccination Clinic  Name:  Teresa Galloway    MRN: [id number] DOB: 1955/07/27  06/25/2019  [name] was observed post Covid-19 immunization for 15 minutes without incident. She was provided with Vaccine Information Sheet and instruction to access the V-Safe system.   [name] was instructed to call 911 with any severe reactions post vaccine: Marland Kitchen Difficulty breathing  . Swelling of face and throat  . A fast heartbeat  . A bad rash all over body  . Dizziness and weakness   Immunizations Administered    Name Date Dose VIS Date Route   Moderna COVID-19 Vaccine 06/25/2019 10:28 AM 0.5 mL 03/09/2019 Intramuscular   Manufacturer: Moderna   Lot: [id number]   [id number]

## 2019-06-28 DIAGNOSIS — M5412 Radiculopathy, cervical region: Secondary | ICD-10-CM | POA: Diagnosis not present

## 2019-06-28 DIAGNOSIS — M4692 Unspecified inflammatory spondylopathy, cervical region: Secondary | ICD-10-CM | POA: Diagnosis not present

## 2019-06-28 DIAGNOSIS — M4802 Spinal stenosis, cervical region: Secondary | ICD-10-CM | POA: Diagnosis not present

## 2019-06-28 DIAGNOSIS — F319 Bipolar disorder, unspecified: Secondary | ICD-10-CM | POA: Diagnosis not present

## 2019-06-30 DIAGNOSIS — M9901 Segmental and somatic dysfunction of cervical region: Secondary | ICD-10-CM | POA: Diagnosis not present

## 2019-06-30 DIAGNOSIS — M47812 Spondylosis without myelopathy or radiculopathy, cervical region: Secondary | ICD-10-CM | POA: Diagnosis not present

## 2019-06-30 DIAGNOSIS — M531 Cervicobrachial syndrome: Secondary | ICD-10-CM | POA: Diagnosis not present

## 2019-07-06 DIAGNOSIS — F329 Major depressive disorder, single episode, unspecified: Secondary | ICD-10-CM | POA: Diagnosis not present

## 2019-07-06 DIAGNOSIS — R21 Rash and other nonspecific skin eruption: Secondary | ICD-10-CM | POA: Diagnosis not present

## 2019-07-06 DIAGNOSIS — G4730 Sleep apnea, unspecified: Secondary | ICD-10-CM | POA: Diagnosis not present

## 2019-07-06 DIAGNOSIS — I10 Essential (primary) hypertension: Secondary | ICD-10-CM | POA: Diagnosis not present

## 2019-07-06 DIAGNOSIS — Z299 Encounter for prophylactic measures, unspecified: Secondary | ICD-10-CM | POA: Diagnosis not present

## 2019-07-07 DIAGNOSIS — M47812 Spondylosis without myelopathy or radiculopathy, cervical region: Secondary | ICD-10-CM | POA: Diagnosis not present

## 2019-07-07 DIAGNOSIS — E039 Hypothyroidism, unspecified: Secondary | ICD-10-CM | POA: Diagnosis not present

## 2019-07-07 DIAGNOSIS — M9901 Segmental and somatic dysfunction of cervical region: Secondary | ICD-10-CM | POA: Diagnosis not present

## 2019-07-07 DIAGNOSIS — R21 Rash and other nonspecific skin eruption: Secondary | ICD-10-CM | POA: Diagnosis not present

## 2019-07-07 DIAGNOSIS — M531 Cervicobrachial syndrome: Secondary | ICD-10-CM | POA: Diagnosis not present

## 2019-07-07 DIAGNOSIS — F419 Anxiety disorder, unspecified: Secondary | ICD-10-CM | POA: Diagnosis not present

## 2019-07-21 DIAGNOSIS — M531 Cervicobrachial syndrome: Secondary | ICD-10-CM | POA: Diagnosis not present

## 2019-07-21 DIAGNOSIS — M47812 Spondylosis without myelopathy or radiculopathy, cervical region: Secondary | ICD-10-CM | POA: Diagnosis not present

## 2019-07-21 DIAGNOSIS — M9901 Segmental and somatic dysfunction of cervical region: Secondary | ICD-10-CM | POA: Diagnosis not present

## 2019-07-26 ENCOUNTER — Ambulatory Visit: Payer: No Typology Code available for payment source | Admitting: Dermatology

## 2019-07-28 ENCOUNTER — Ambulatory Visit: Payer: PPO | Attending: Internal Medicine

## 2019-07-28 DIAGNOSIS — T7840XA Allergy, unspecified, initial encounter: Secondary | ICD-10-CM | POA: Diagnosis not present

## 2019-07-28 DIAGNOSIS — R42 Dizziness and giddiness: Secondary | ICD-10-CM | POA: Diagnosis not present

## 2019-07-28 DIAGNOSIS — I10 Essential (primary) hypertension: Secondary | ICD-10-CM | POA: Diagnosis not present

## 2019-07-28 DIAGNOSIS — R030 Elevated blood-pressure reading, without diagnosis of hypertension: Secondary | ICD-10-CM | POA: Diagnosis not present

## 2019-07-28 DIAGNOSIS — T50B95A Adverse effect of other viral vaccines, initial encounter: Secondary | ICD-10-CM | POA: Diagnosis not present

## 2019-07-28 DIAGNOSIS — R519 Headache, unspecified: Secondary | ICD-10-CM | POA: Diagnosis not present

## 2019-07-28 DIAGNOSIS — G4489 Other headache syndrome: Secondary | ICD-10-CM | POA: Diagnosis not present

## 2019-07-28 DIAGNOSIS — Z23 Encounter for immunization: Secondary | ICD-10-CM

## 2019-07-28 NOTE — Progress Notes (Signed)
   Covid-19 Vaccination Clinic  Name:  Teresa Galloway    MRN: [id number] DOB: 25-Nov-1955  07/28/2019  Teresa Galloway was observed post Covid-19 immunization for 30 minutes based on pre-vaccination screening .  During the observation period, she experienced an adverse reaction with the following symptoms:  dizziness and dry mouth.  Assessment : Time of assessment 10:54. Alert and oriented, Anxious and Felt wobbly, headache. Completed observation period of 30 minutes, stood up to go home, felt dizzy, dry mouth, wobbly 1054 BP 128/85 pulse 99; 1102 BP 139/80 Pulse 93, taken from observation area  Via w/c to stretcher in hall way, given water, continues to feel dizzy  1110BP 143/96 Pulse 91 pulse ox 98, 1118 Complaints of headache BP 144/101 Pulse 89 pulse ox 98 , 1124 EMS called continues to feel dizzy with headache, 1137 EMS arrived BP 143/84 pulse ox 96 1141 Complaints of severe headache dizziness BP 135/84, sitting BP 149/92 pulse 96 pulse ox 97 became dizzy standing for BP unable to complete.  1145 am transported to [facility] per patient request via [facility], purse given to mother  Actions taken:  Vitals sign taken  EMS called at  1124. VAERS form obtained and completed by Janan Ridge. Hand off to EMS upon arrival at 1137.  There were no vitals filed for this visit.  Medications administered: No medication administered.  Disposition:Patient evaluated and transferred to the hospital for further evaluation and care. Time of transport: 1145.   Immunizations Administered    Name Date Dose VIS Date Route   Moderna COVID-19 Vaccine 07/28/2019 10:14 AM 0.5 mL 03/2019 Intramuscular   Manufacturer: Moderna   Lot: [id number]   [id number]

## 2019-07-29 ENCOUNTER — Telehealth: Payer: Self-pay | Admitting: *Deleted

## 2019-07-29 NOTE — Telephone Encounter (Signed)
Called and left a voicemail asking for the patient to call back in regards to her second COVID vaccine that she received yesterday. Asked for her to call back to check and see if she is having any worsening symptoms or has any questions or concerns. No MyChart. Will follow up.

## 2019-08-03 DIAGNOSIS — F419 Anxiety disorder, unspecified: Secondary | ICD-10-CM | POA: Diagnosis not present

## 2019-08-03 DIAGNOSIS — Z87891 Personal history of nicotine dependence: Secondary | ICD-10-CM | POA: Diagnosis not present

## 2019-08-03 DIAGNOSIS — Z299 Encounter for prophylactic measures, unspecified: Secondary | ICD-10-CM | POA: Diagnosis not present

## 2019-08-03 DIAGNOSIS — Z889 Allergy status to unspecified drugs, medicaments and biological substances status: Secondary | ICD-10-CM | POA: Diagnosis not present

## 2019-08-03 DIAGNOSIS — M9901 Segmental and somatic dysfunction of cervical region: Secondary | ICD-10-CM | POA: Diagnosis not present

## 2019-08-03 DIAGNOSIS — M531 Cervicobrachial syndrome: Secondary | ICD-10-CM | POA: Diagnosis not present

## 2019-08-03 DIAGNOSIS — M47812 Spondylosis without myelopathy or radiculopathy, cervical region: Secondary | ICD-10-CM | POA: Diagnosis not present

## 2019-08-03 DIAGNOSIS — R0989 Other specified symptoms and signs involving the circulatory and respiratory systems: Secondary | ICD-10-CM | POA: Diagnosis not present

## 2019-08-06 DIAGNOSIS — F419 Anxiety disorder, unspecified: Secondary | ICD-10-CM | POA: Diagnosis not present

## 2019-08-06 DIAGNOSIS — E039 Hypothyroidism, unspecified: Secondary | ICD-10-CM | POA: Diagnosis not present

## 2019-08-06 NOTE — Telephone Encounter (Signed)
Letter mailed

## 2019-08-17 DIAGNOSIS — M531 Cervicobrachial syndrome: Secondary | ICD-10-CM | POA: Diagnosis not present

## 2019-08-17 DIAGNOSIS — M9901 Segmental and somatic dysfunction of cervical region: Secondary | ICD-10-CM | POA: Diagnosis not present

## 2019-08-17 DIAGNOSIS — M47812 Spondylosis without myelopathy or radiculopathy, cervical region: Secondary | ICD-10-CM | POA: Diagnosis not present

## 2019-08-22 DIAGNOSIS — R05 Cough: Secondary | ICD-10-CM | POA: Diagnosis not present

## 2019-08-22 DIAGNOSIS — B9789 Other viral agents as the cause of diseases classified elsewhere: Secondary | ICD-10-CM | POA: Diagnosis not present

## 2019-08-22 DIAGNOSIS — J069 Acute upper respiratory infection, unspecified: Secondary | ICD-10-CM | POA: Diagnosis not present

## 2019-08-22 DIAGNOSIS — R221 Localized swelling, mass and lump, neck: Secondary | ICD-10-CM | POA: Diagnosis not present

## 2019-08-22 DIAGNOSIS — Z87891 Personal history of nicotine dependence: Secondary | ICD-10-CM | POA: Diagnosis not present

## 2019-08-22 DIAGNOSIS — R519 Headache, unspecified: Secondary | ICD-10-CM | POA: Diagnosis not present

## 2019-08-25 DIAGNOSIS — G4730 Sleep apnea, unspecified: Secondary | ICD-10-CM | POA: Diagnosis not present

## 2019-08-25 DIAGNOSIS — Z299 Encounter for prophylactic measures, unspecified: Secondary | ICD-10-CM | POA: Diagnosis not present

## 2019-08-25 DIAGNOSIS — I10 Essential (primary) hypertension: Secondary | ICD-10-CM | POA: Diagnosis not present

## 2019-08-25 DIAGNOSIS — E039 Hypothyroidism, unspecified: Secondary | ICD-10-CM | POA: Diagnosis not present

## 2019-08-25 DIAGNOSIS — J029 Acute pharyngitis, unspecified: Secondary | ICD-10-CM | POA: Diagnosis not present

## 2019-09-03 DIAGNOSIS — Z299 Encounter for prophylactic measures, unspecified: Secondary | ICD-10-CM | POA: Diagnosis not present

## 2019-09-03 DIAGNOSIS — J42 Unspecified chronic bronchitis: Secondary | ICD-10-CM | POA: Diagnosis not present

## 2019-09-03 DIAGNOSIS — J302 Other seasonal allergic rhinitis: Secondary | ICD-10-CM | POA: Diagnosis not present

## 2019-09-03 DIAGNOSIS — E039 Hypothyroidism, unspecified: Secondary | ICD-10-CM | POA: Diagnosis not present

## 2019-09-03 DIAGNOSIS — Z87891 Personal history of nicotine dependence: Secondary | ICD-10-CM | POA: Diagnosis not present

## 2019-09-03 DIAGNOSIS — J069 Acute upper respiratory infection, unspecified: Secondary | ICD-10-CM | POA: Diagnosis not present

## 2019-09-07 DIAGNOSIS — Z299 Encounter for prophylactic measures, unspecified: Secondary | ICD-10-CM | POA: Diagnosis not present

## 2019-09-07 DIAGNOSIS — F331 Major depressive disorder, recurrent, moderate: Secondary | ICD-10-CM | POA: Diagnosis not present

## 2019-09-07 DIAGNOSIS — I10 Essential (primary) hypertension: Secondary | ICD-10-CM | POA: Diagnosis not present

## 2019-09-07 DIAGNOSIS — R05 Cough: Secondary | ICD-10-CM | POA: Diagnosis not present

## 2019-09-07 DIAGNOSIS — R0602 Shortness of breath: Secondary | ICD-10-CM | POA: Diagnosis not present

## 2019-09-07 DIAGNOSIS — J069 Acute upper respiratory infection, unspecified: Secondary | ICD-10-CM | POA: Diagnosis not present

## 2019-09-07 DIAGNOSIS — R0989 Other specified symptoms and signs involving the circulatory and respiratory systems: Secondary | ICD-10-CM | POA: Diagnosis not present

## 2019-09-07 DIAGNOSIS — Z87891 Personal history of nicotine dependence: Secondary | ICD-10-CM | POA: Diagnosis not present

## 2019-09-14 ENCOUNTER — Telehealth: Payer: Self-pay

## 2019-09-14 MED ORDER — BETAMETHASONE DIPROPIONATE 0.05 % EX CREA: TOPICAL_CREAM | Freq: Two times a day (BID) | CUTANEOUS | 2 refills | Status: DC

## 2019-09-14 NOTE — Telephone Encounter (Signed)
Ok to refill per Dr Denna Haggard

## 2019-09-30 DIAGNOSIS — M9901 Segmental and somatic dysfunction of cervical region: Secondary | ICD-10-CM | POA: Diagnosis not present

## 2019-09-30 DIAGNOSIS — M531 Cervicobrachial syndrome: Secondary | ICD-10-CM | POA: Diagnosis not present

## 2019-09-30 DIAGNOSIS — M47812 Spondylosis without myelopathy or radiculopathy, cervical region: Secondary | ICD-10-CM | POA: Diagnosis not present

## 2019-10-06 ENCOUNTER — Other Ambulatory Visit: Payer: Self-pay | Admitting: Neurology

## 2019-10-07 DIAGNOSIS — I10 Essential (primary) hypertension: Secondary | ICD-10-CM | POA: Diagnosis not present

## 2019-10-07 DIAGNOSIS — J42 Unspecified chronic bronchitis: Secondary | ICD-10-CM | POA: Diagnosis not present

## 2019-10-07 DIAGNOSIS — R05 Cough: Secondary | ICD-10-CM | POA: Diagnosis not present

## 2019-10-07 DIAGNOSIS — F331 Major depressive disorder, recurrent, moderate: Secondary | ICD-10-CM | POA: Diagnosis not present

## 2019-10-07 DIAGNOSIS — Z87891 Personal history of nicotine dependence: Secondary | ICD-10-CM | POA: Diagnosis not present

## 2019-10-07 DIAGNOSIS — Z299 Encounter for prophylactic measures, unspecified: Secondary | ICD-10-CM | POA: Diagnosis not present

## 2019-10-28 DIAGNOSIS — S338XXA Sprain of other parts of lumbar spine and pelvis, initial encounter: Secondary | ICD-10-CM | POA: Diagnosis not present

## 2019-10-28 DIAGNOSIS — M9902 Segmental and somatic dysfunction of thoracic region: Secondary | ICD-10-CM | POA: Diagnosis not present

## 2019-10-28 DIAGNOSIS — M9903 Segmental and somatic dysfunction of lumbar region: Secondary | ICD-10-CM | POA: Diagnosis not present

## 2019-10-28 DIAGNOSIS — S2341XA Sprain of ribs, initial encounter: Secondary | ICD-10-CM | POA: Diagnosis not present

## 2019-10-30 DIAGNOSIS — R0902 Hypoxemia: Secondary | ICD-10-CM | POA: Diagnosis not present

## 2019-10-30 DIAGNOSIS — R197 Diarrhea, unspecified: Secondary | ICD-10-CM | POA: Diagnosis not present

## 2019-10-30 DIAGNOSIS — R110 Nausea: Secondary | ICD-10-CM | POA: Diagnosis not present

## 2019-10-30 DIAGNOSIS — R6884 Jaw pain: Secondary | ICD-10-CM | POA: Diagnosis not present

## 2019-10-30 DIAGNOSIS — I10 Essential (primary) hypertension: Secondary | ICD-10-CM | POA: Diagnosis not present

## 2019-10-30 DIAGNOSIS — R52 Pain, unspecified: Secondary | ICD-10-CM | POA: Diagnosis not present

## 2019-10-30 DIAGNOSIS — R1013 Epigastric pain: Secondary | ICD-10-CM | POA: Diagnosis not present

## 2019-10-30 DIAGNOSIS — R112 Nausea with vomiting, unspecified: Secondary | ICD-10-CM | POA: Diagnosis not present

## 2019-10-30 DIAGNOSIS — Z882 Allergy status to sulfonamides status: Secondary | ICD-10-CM | POA: Diagnosis not present

## 2019-10-30 DIAGNOSIS — T368X5A Adverse effect of other systemic antibiotics, initial encounter: Secondary | ICD-10-CM | POA: Diagnosis not present

## 2019-11-02 NOTE — Progress Notes (Signed)
Virtual Visit via Video Note The purpose of this virtual visit is to provide medical care while limiting exposure to the novel coronavirus.    Consent was obtained for video visit:  Yes.   Answered questions that patient had about telehealth interaction:  Yes.   I discussed the limitations, risks, security and privacy concerns of performing an evaluation and management service by telemedicine. I also discussed with the patient that there may be a patient responsible charge related to this service. The patient expressed understanding and agreed to proceed.  Pt location: Home Physician Location: office Name of referring provider:  Glenda Chroman, MD I connected with Teresa Galloway at patients initiation/request on 11/03/2019 at 10:30 AM EDT by video enabled telemedicine application and verified that I am speaking with the correct person using two identifiers. 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   UPDATE: Current medications: Ropinirole 2mg  three times daily; alprazolam 0.25mg  infrequently but may be takenfour times daily PRN; duloxetine 60mg  daily; rizatriptan 10mg ; propranolol 60mg  daily; lamotrigine 100mg  at bedtime; levothyroxine, Zofran  1.  Restless Leg Syndrome: Current medication:  Not taking Mirapex.  Back on ropinirole, which has been helpful.   2.  Left arm pain and paresthesias:   Deferred epidural injection.  She has seen a chiropractor which helps.  Applying tools discussed with Dr. Melvyn Novas to address memory and depression.  Able to concentrate and read again.  Playing more strategy games.    Never received a call to schedule for evaluation for sleep apnea.  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 She stopped Lithium. However, she continues to have profuse sweating, tingling itch across the face and hands (like a feather running across her skin.   She still has tingling and aching pain radiating down the left arm from shoulder down to the entire hand. No weakness. No neck pain. She does have history of cervical C5-C6-C7 spine fusion 10 years ago.  01/28/2019 NCV-EMG:  normal.  02/19/2019 MRI Cervical Spine Wo:  ACDF C5-6 and C6-7 without stenosis; 2 mm anterolisthesis with central disc protrusion, uncinate spurring and moderate facet degeneration at C4-5 causing mild spinal stenosis and moderate foraminal stenosis bilaterally.  She started seeing a chiropractor which has been helpful.  If she turns her head to the left, she feels the pain and numbness down the left shoulder and to the fingertips.    She has restless leg syndrome which lasts all day.  12/30/2018 LABS:  Ferritin 65, TSH 1.00, B12 >2,000.   She continued to have short-term memory deficits.12/30/2018 LABS:  TSH 1.00, B12 >2,000.  She underwent neuropsychological testing with Dr. Melvyn Novas on 03/25/2019, which did not demonstrate evidence of neurocognitive deficits.  Symptoms were thought to be possibly related to severe depression and anxiety and pharmacologic effect.     Past medications: lithium; gabapentin (muscle jerks), Lyrica; ropinrole (lost efficacy).  PAST MEDICAL HISTORY: Past  Medical History:  Diagnosis Date   Acute pericarditis 12/2013   Bipolar II disorder    Possible diagnosis   Complication of anesthesia    Effects lasted  for 3 days   Dyslipidemia    Generalized anxiety disorder with panic attacks    Hx of lithium toxicity    Hx of migraines    On propranolol.   Hypothyroid    Major depressive disorder 04/16/2013   Obstructive sleep apnea    Currently untreatred; oral device caused TMJ; unsuccessful with CPAP   Pericardial effusion 01/03/2014   Moderate without hemodynamic compromise.   Sinus tachycardia     MEDICATIONS: Current Outpatient Medications on File Prior to Visit  Medication Sig Dispense Refill   ALPRAZolam (XANAX) 0.25 MG tablet Take 0.25 mg by mouth 4 (four) times daily as needed for anxiety.      AMBULATORY NON FORMULARY MEDICATION Medication Name:[facility] in [place] fax: [phone]  Left wrist long or short brace 1 Device 0   betamethasone dipropionate 0.05 % cream Apply topically 2 (two) times daily. 30 g 2   cephALEXin (KEFLEX) 500 MG capsule TAKE ONE CAPSULE BY MOUTH TWICE DAILY FOR 7 DAYS.     cetirizine (ZYRTEC) 10 MG tablet Take 10 mg by mouth daily.     Cholecalciferol (VITAMIN D3) 1000 units CAPS Take 1,000 Units by mouth daily.     cimetidine (TAGAMET) 300 MG tablet Take 300 mg by mouth daily.     citalopram (CELEXA) 40 MG tablet Take 40 mg by mouth daily.      diclofenac sodium (VOLTAREN) 1 % GEL Apply 2 g topically 3 (three) times daily as needed (PRN for arthritis pain in hands, knees and shoulder-uses about 1 tube/month).     dicyclomine (BENTYL) 10 MG capsule Take 1 capsule (10 mg total) by mouth 3 (three) times daily before meals. 90 capsule 4   EPIPEN 2-PAK 0.3 MG/0.3ML SOAJ injection Inject 0.3 mg into the muscle once.   1   Eyelid Cleansers (AVENOVA) 0.01 % SOLN Apply 1 application topically 2 (two) times daily.      levothyroxine (SYNTHROID, LEVOTHROID) 75 MCG tablet Take 75 mcg by mouth daily before breakfast.     Lifitegrast (XIIDRA) 5 % SOLN Apply 1 drop to eye 2 (two) times daily.     olopatadine (PATANOL) 0.1 % ophthalmic  solution Place 1 drop into both eyes 2 (two) times daily.      omeprazole (PRILOSEC) 20 MG capsule Take 20 mg by mouth daily.     ondansetron (ZOFRAN) 4 MG tablet Take 4 mg by mouth every 8 (eight) hours as needed for nausea or vomiting.     pramipexole (MIRAPEX) 0.125 MG tablet TAKE ONE TABLET BY MOUTH THREE TIMES DAILY 90 tablet 6   pregabalin (LYRICA) 75 MG capsule Take 1 capsule (75 mg total) by mouth 3 (three) times daily. (Patient not taking: Reported on 06/24/2019) 270 capsule 3   propranolol (INDERAL) 60 MG tablet Take 60 mg by mouth daily.     Propylene Glycol (SYSTANE COMPLETE OP) Apply 1 drop to eye 4 (four) times daily. Also uses prn     rizatriptan (MAXALT) 10 MG tablet Take 10 mg by mouth as needed for migraine. May repeat in 2 hours if needed     rOPINIRole (REQUIP) 2 MG tablet Take 2 mg by mouth 4 (four) times daily.      No current facility-administered medications on file prior to visit.    ALLERGIES: Allergies  Allergen Reactions   Other Anaphylaxis, Swelling and Other (See Comments)    Walnuts   Sulfa Antibiotics Shortness Of Breath and Other (See Comments)    Swelling of lips    Sunscreens Anaphylaxis, Hives and Other (See Comments)    Titanium and zinc oxides Eye swelling    Seroquel [Quetiapine] Other (See Comments)    Pychosis (foggy, uncoordinated, slurred speech)   Tape Hives    FAMILY HISTORY: Family History  Problem Relation Age of Onset   Depression Mother    Depression Father    Depression Sister    Anxiety disorder Sister    Alcohol abuse Maternal Uncle    Alcohol abuse Paternal Uncle    Alcohol abuse Maternal Grandfather    Depression Maternal Grandmother     SOCIAL HISTORY: Social History   Socioeconomic History   Marital status: Divorced    Spouse name: Not on file   Number of children: 0   Years of education: 12   Highest education level: Some college, no degree  Occupational History   Occupation:  Disability  Tobacco Use   Smoking status: Former Smoker    Packs/day: 1.00    Years: 30.00    Pack years: 30.00    Types: Cigarettes    Start date: 05/13/1975    Quit date: 07/07/2012    Years since quitting: 7.3   Smokeless tobacco: Never Used   Tobacco comment: Quit 1 year ago  Vaping Use   Vaping Use: Never used  Substance and Sexual Activity   Alcohol use: Yes    Alcohol/week: 3.0 standard drinks    Types: 3 Glasses of wine per week   Drug use: No   Sexual activity: Not Currently    Partners: Male    Birth control/protection: Surgical  Other Topics Concern   Not on file  Social History Narrative   She lives alone. One story home she has no children   Has some college education   Right handed   Social Determinants of Health   Financial Resource Strain:    Difficulty of Paying Living Expenses:   Food Insecurity:    Worried About Charity fundraiser in the Last Year:    Arboriculturist in the Last Year:   Transportation Needs:    Film/video editor (Medical):    Lack of Transportation (Non-Medical):   Physical Activity:    Days of Exercise per Week:    Minutes of Exercise per Session:   Stress:    Feeling of Stress :   Social Connections:    Frequency of Communication with Friends and Family:    Frequency of Social Gatherings with Friends and Family:    Attends Religious Services:    Active Member of Clubs or Organizations:    Attends Music therapist:    Marital Status:   Intimate Partner Violence:    Fear of Current or Ex-Partner:    Emotionally Abused:    Physically Abused:    Sexually Abused:     Objective: There were no vitals taken for this visit. General: No acute distress.    Assessment & Plan: 1.  Restless leg syndrome 2.  Left arm pain and paresthesias, unclear etiology.  No evidence of mononeuropathy (carpal tunnel syndrome, ulnar neuropathy, etc) or cervical radiculopathy on testing.  However, symptoms  sound radicular.   MRI of cervical spine does reveal multifactorial neural foraminal and mild spinal stenosis at C4-5 which does not correlate with dermatome of  her symptoms and did not respond to C5 epidural injection. 3.  Subjective memory deficits.  No evidence of neurocognitive disorder on neuropsychological testing. Multifactorial related to depression/anxiety, pharmacologic effect and poorly controled complex sleep apnea.    1.  Ropinirole 2mg  three times daily 2.  Refer to sleep medicine for evaluation of sleep apnea. 3  Follow up in 6 months.  Follow Up Instructions:    -I discussed the assessment and treatment plan with the patient. The patient was provided an opportunity to ask questions and all were answered. The patient agreed with the plan and demonstrated an understanding of the instructions.   The patient was advised to call back or seek an in-person evaluation if the symptoms worsen or if the condition fails to improve as anticipated.    Dudley Major, DO

## 2019-11-03 ENCOUNTER — Other Ambulatory Visit: Payer: Self-pay

## 2019-11-03 ENCOUNTER — Telehealth (INDEPENDENT_AMBULATORY_CARE_PROVIDER_SITE_OTHER): Payer: PPO | Admitting: Neurology

## 2019-11-03 ENCOUNTER — Encounter: Payer: Self-pay | Admitting: Neurology

## 2019-11-03 VITALS — Ht 66.0 in | Wt 136.0 lb

## 2019-11-03 DIAGNOSIS — G2581 Restless legs syndrome: Secondary | ICD-10-CM

## 2019-11-03 DIAGNOSIS — G4733 Obstructive sleep apnea (adult) (pediatric): Secondary | ICD-10-CM | POA: Diagnosis not present

## 2019-11-03 DIAGNOSIS — M5412 Radiculopathy, cervical region: Secondary | ICD-10-CM | POA: Diagnosis not present

## 2019-11-03 DIAGNOSIS — G4739 Other sleep apnea: Secondary | ICD-10-CM

## 2019-11-04 DIAGNOSIS — M9902 Segmental and somatic dysfunction of thoracic region: Secondary | ICD-10-CM | POA: Diagnosis not present

## 2019-11-04 DIAGNOSIS — S2341XA Sprain of ribs, initial encounter: Secondary | ICD-10-CM | POA: Diagnosis not present

## 2019-11-04 DIAGNOSIS — M9903 Segmental and somatic dysfunction of lumbar region: Secondary | ICD-10-CM | POA: Diagnosis not present

## 2019-11-04 DIAGNOSIS — S338XXA Sprain of other parts of lumbar spine and pelvis, initial encounter: Secondary | ICD-10-CM | POA: Diagnosis not present

## 2019-11-05 DIAGNOSIS — K219 Gastro-esophageal reflux disease without esophagitis: Secondary | ICD-10-CM | POA: Diagnosis not present

## 2019-11-05 DIAGNOSIS — E785 Hyperlipidemia, unspecified: Secondary | ICD-10-CM | POA: Diagnosis not present

## 2019-11-05 DIAGNOSIS — I10 Essential (primary) hypertension: Secondary | ICD-10-CM | POA: Diagnosis not present

## 2019-11-05 DIAGNOSIS — E039 Hypothyroidism, unspecified: Secondary | ICD-10-CM | POA: Diagnosis not present

## 2019-11-08 DIAGNOSIS — G4730 Sleep apnea, unspecified: Secondary | ICD-10-CM | POA: Diagnosis not present

## 2019-11-08 DIAGNOSIS — R197 Diarrhea, unspecified: Secondary | ICD-10-CM | POA: Diagnosis not present

## 2019-11-08 DIAGNOSIS — T50905A Adverse effect of unspecified drugs, medicaments and biological substances, initial encounter: Secondary | ICD-10-CM | POA: Diagnosis not present

## 2019-11-08 DIAGNOSIS — Z299 Encounter for prophylactic measures, unspecified: Secondary | ICD-10-CM | POA: Diagnosis not present

## 2019-11-08 DIAGNOSIS — I10 Essential (primary) hypertension: Secondary | ICD-10-CM | POA: Diagnosis not present

## 2019-11-17 DIAGNOSIS — S2341XA Sprain of ribs, initial encounter: Secondary | ICD-10-CM | POA: Diagnosis not present

## 2019-11-17 DIAGNOSIS — M9903 Segmental and somatic dysfunction of lumbar region: Secondary | ICD-10-CM | POA: Diagnosis not present

## 2019-11-17 DIAGNOSIS — S338XXA Sprain of other parts of lumbar spine and pelvis, initial encounter: Secondary | ICD-10-CM | POA: Diagnosis not present

## 2019-11-17 DIAGNOSIS — M9902 Segmental and somatic dysfunction of thoracic region: Secondary | ICD-10-CM | POA: Diagnosis not present

## 2019-11-24 DIAGNOSIS — S338XXA Sprain of other parts of lumbar spine and pelvis, initial encounter: Secondary | ICD-10-CM | POA: Diagnosis not present

## 2019-11-24 DIAGNOSIS — M9903 Segmental and somatic dysfunction of lumbar region: Secondary | ICD-10-CM | POA: Diagnosis not present

## 2019-11-24 DIAGNOSIS — M9902 Segmental and somatic dysfunction of thoracic region: Secondary | ICD-10-CM | POA: Diagnosis not present

## 2019-11-24 DIAGNOSIS — S2341XA Sprain of ribs, initial encounter: Secondary | ICD-10-CM | POA: Diagnosis not present

## 2019-11-30 DIAGNOSIS — Z299 Encounter for prophylactic measures, unspecified: Secondary | ICD-10-CM | POA: Diagnosis not present

## 2019-11-30 DIAGNOSIS — M25552 Pain in left hip: Secondary | ICD-10-CM | POA: Diagnosis not present

## 2019-11-30 DIAGNOSIS — Z87891 Personal history of nicotine dependence: Secondary | ICD-10-CM | POA: Diagnosis not present

## 2019-11-30 DIAGNOSIS — F329 Major depressive disorder, single episode, unspecified: Secondary | ICD-10-CM | POA: Diagnosis not present

## 2019-11-30 DIAGNOSIS — G4730 Sleep apnea, unspecified: Secondary | ICD-10-CM | POA: Diagnosis not present

## 2019-12-14 IMAGING — MR MR CERVICAL SPINE W/O CM
5 series · 31 of 48 positions shown · non-contrast
Comparison: None

CLINICAL DATA: Left arm pain and numbness. Prior neck surgery 11
years ago

EXAM:
MRI CERVICAL SPINE WITHOUT CONTRAST
TECHNIQUE: Multiplanar, multisequence MR imaging of the cervical spine was
performed. No intravenous contrast was administered.

[Series 4: T2 · sagittal · 3.3mm · 0.41mm/px · 6 of 12 slices shown (1 of 2)]
[im 1/12]
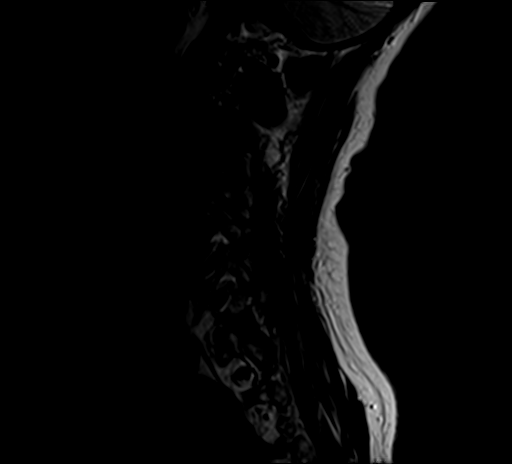
[im 3/12]
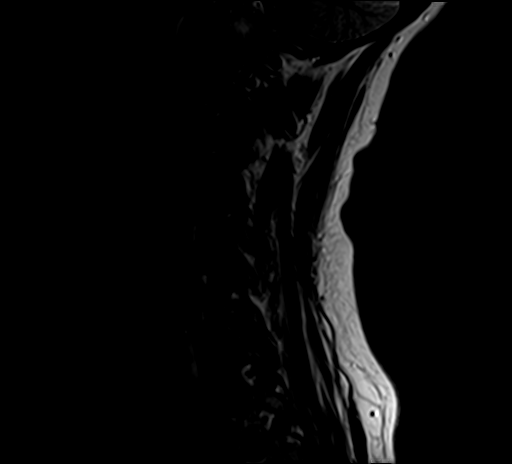
[im 5/12]
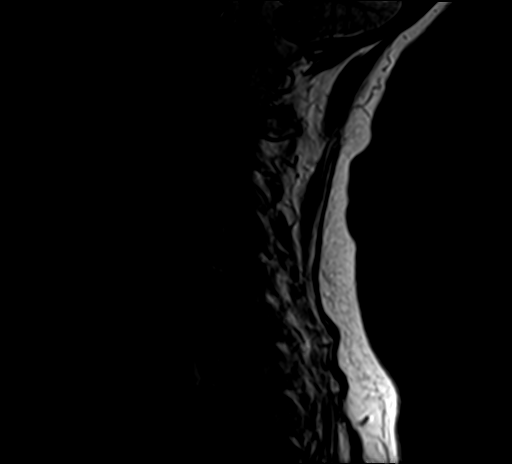
[im 7/12]
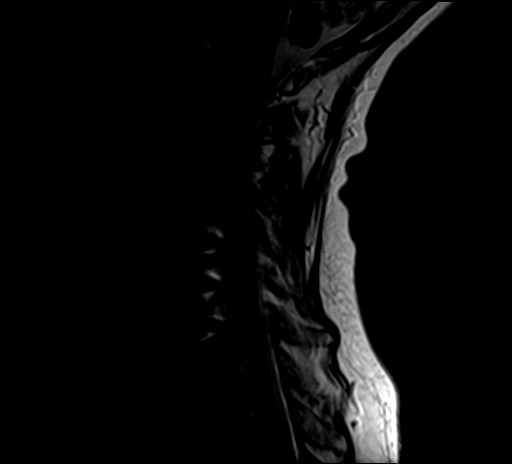
[im 9/12]
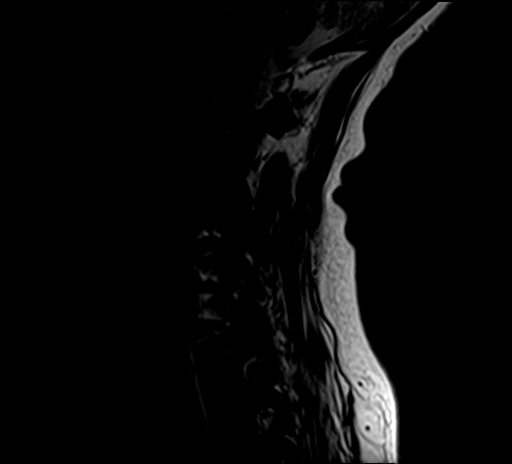
[im 12/12]
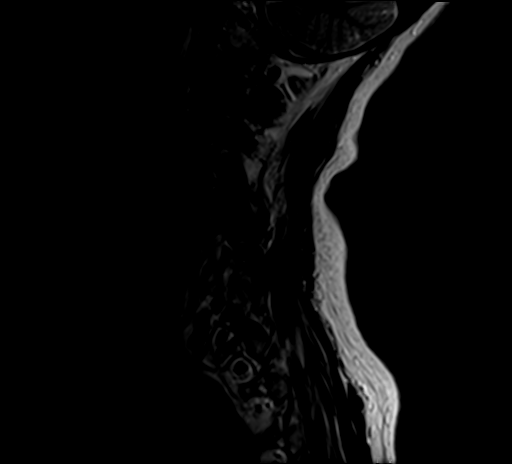

[Series 5: T1 · sagittal · 3.3mm · 0.41mm/px · 6 of 12 slices shown]
[im 1/12]
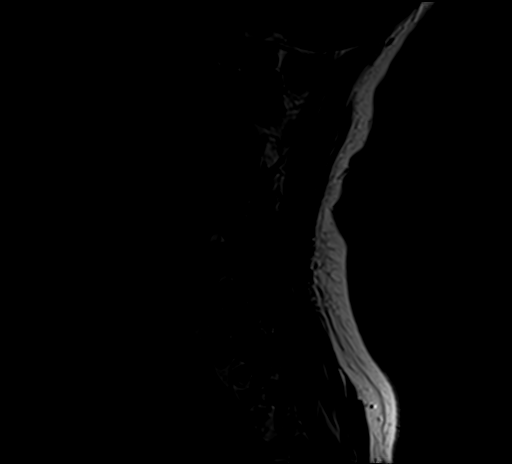
[im 3/12]
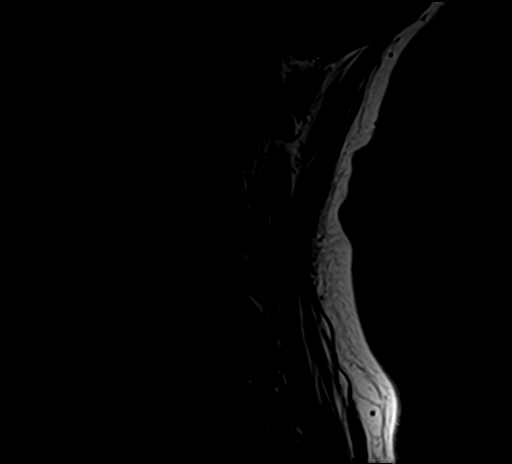
[im 5/12]
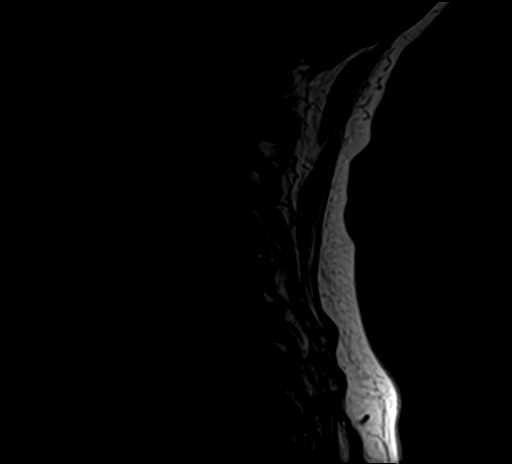
[im 7/12]
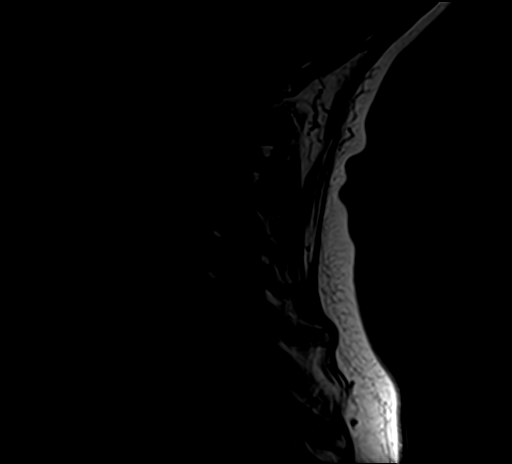
[im 9/12]
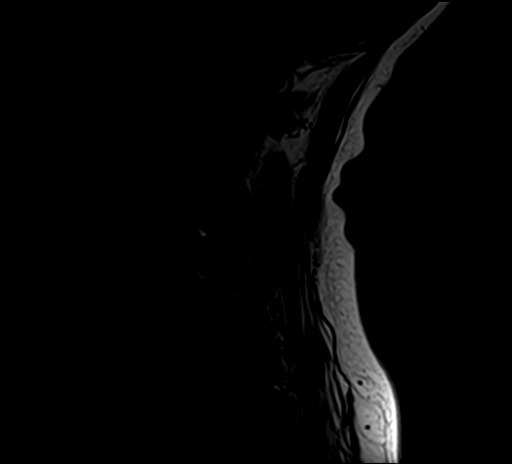
[im 12/12]
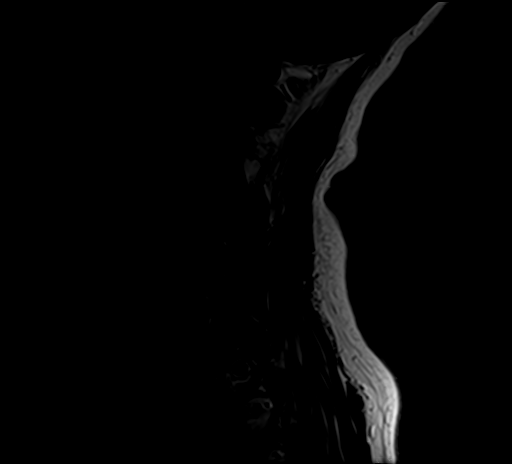

[Series 6: STIR · sagittal · 3.3mm · 0.82mm/px · 6 of 12 slices shown]
[im 1/12]
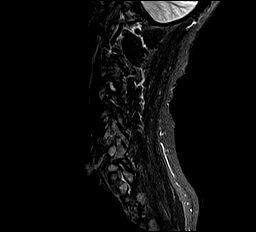
[im 3/12]
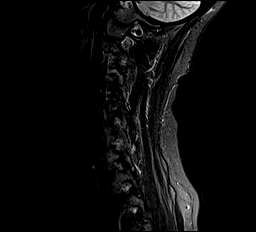
[im 5/12]
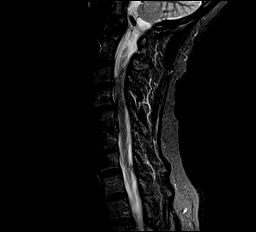
[im 7/12]
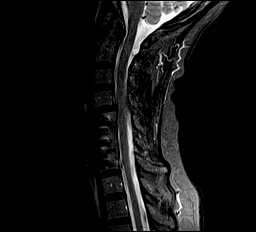
[im 9/12]
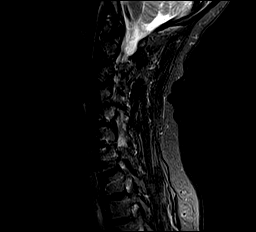
[im 12/12]
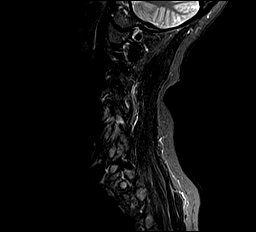

[Series 7: GRE · axial · 3.0mm · 0.35mm/px · z∈[-99,-60]mm · 4 of 28 slices shown]
[im 1/28]
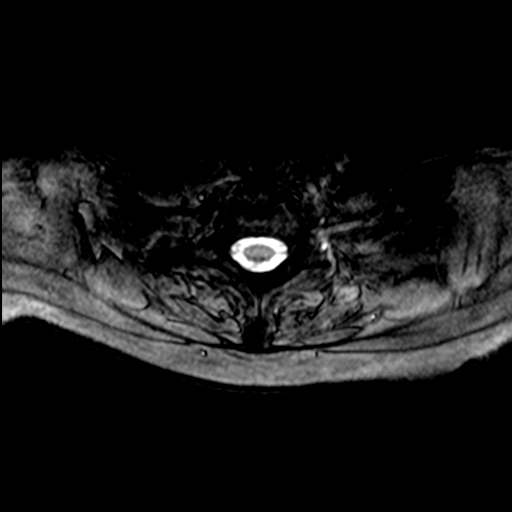
[im 4/28]
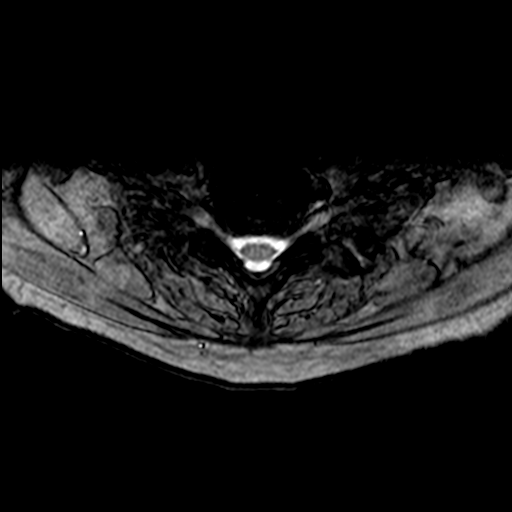
[im 8/28]
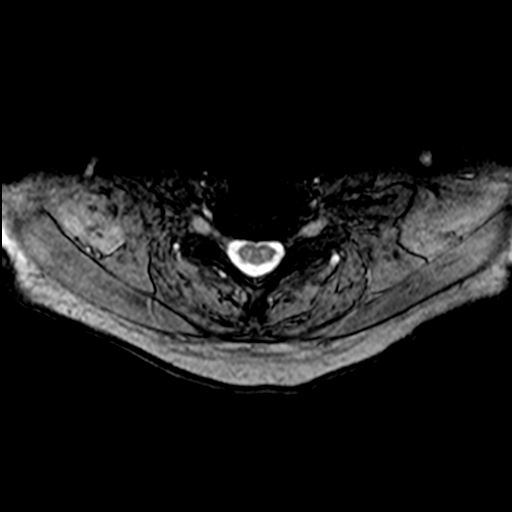
[im 12/28]
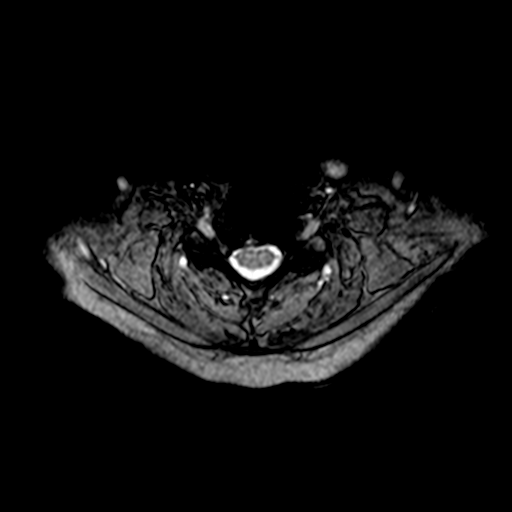

[Series 8: T2 · axial · 3.0mm · 0.70mm/px · z∈[-99,-3]mm · 9 of 28 slices shown (2 of 2)]
[im 1/28]
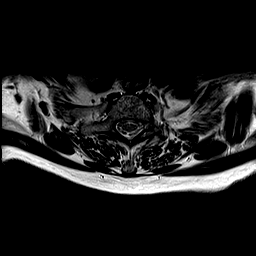
[im 4/28]
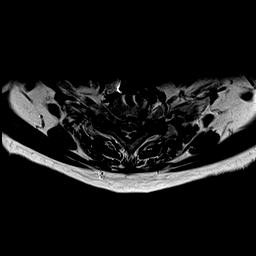
[im 8/28]
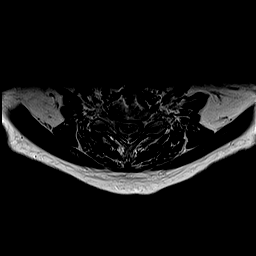
[im 12/28]
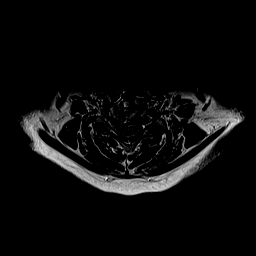
[im 14/28]
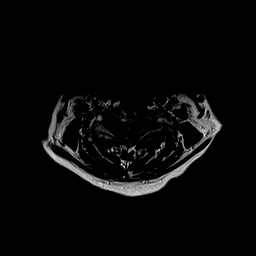
[im 16/28]
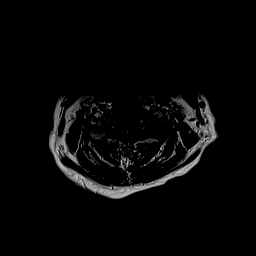
[im 20/28]
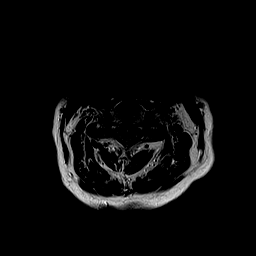
[im 24/28]
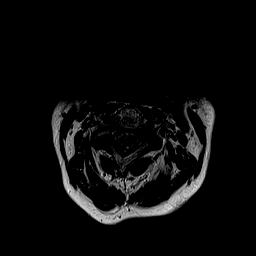
[im 28/28]
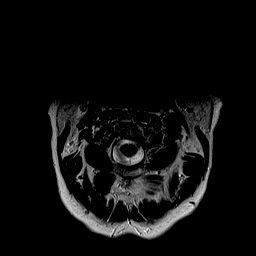

[31 of 48 positions shown; findings below may reference images not displayed]

FINDINGS: Alignment: 2 mm anterolisthesis C4-5. Straightening of the cervical
lordosis.

Vertebrae: Negative for fracture or mass.  ACDF C5 through C7

Cord: Normal spinal cord signal.  Negative for cord compression.

Posterior Fossa, vertebral arteries, paraspinal tissues: Negative

Disc levels:

C2-3: Negative

C3-4: Small central disc protrusion. Mild uncinate spurring and mild
foraminal stenosis bilaterally

C4-5: Small central disc protrusion. Moderate facet hypertrophy
bilaterally. Diffuse uncinate spurring. Mild spinal stenosis and
moderate foraminal stenosis bilaterally.

C5-6: ACDF.  Negative for stenosis

C6-7: ACDF.  Negative for stenosis

C7-T1: Mild facet degeneration.  Negative for stenosis.
IMPRESSION: ACDF C5-6 and C6-7 without stenosis

2 mm anterolisthesis C4-5. Central disc protrusion with uncinate
spurring and moderate facet degeneration bilaterally. Mild spinal
stenosis and moderate foraminal stenosis bilaterally at C4-5.

## 2019-12-15 DIAGNOSIS — M778 Other enthesopathies, not elsewhere classified: Secondary | ICD-10-CM | POA: Diagnosis not present

## 2019-12-15 DIAGNOSIS — M76892 Other specified enthesopathies of left lower limb, excluding foot: Secondary | ICD-10-CM | POA: Diagnosis not present

## 2019-12-15 DIAGNOSIS — M160 Bilateral primary osteoarthritis of hip: Secondary | ICD-10-CM | POA: Diagnosis not present

## 2019-12-15 DIAGNOSIS — Z299 Encounter for prophylactic measures, unspecified: Secondary | ICD-10-CM | POA: Diagnosis not present

## 2019-12-15 DIAGNOSIS — F329 Major depressive disorder, single episode, unspecified: Secondary | ICD-10-CM | POA: Diagnosis not present

## 2019-12-15 DIAGNOSIS — M25552 Pain in left hip: Secondary | ICD-10-CM | POA: Diagnosis not present

## 2019-12-15 DIAGNOSIS — M1612 Unilateral primary osteoarthritis, left hip: Secondary | ICD-10-CM | POA: Diagnosis not present

## 2019-12-27 DIAGNOSIS — S2341XA Sprain of ribs, initial encounter: Secondary | ICD-10-CM | POA: Diagnosis not present

## 2019-12-27 DIAGNOSIS — S338XXA Sprain of other parts of lumbar spine and pelvis, initial encounter: Secondary | ICD-10-CM | POA: Diagnosis not present

## 2019-12-27 DIAGNOSIS — M9902 Segmental and somatic dysfunction of thoracic region: Secondary | ICD-10-CM | POA: Diagnosis not present

## 2019-12-27 DIAGNOSIS — M9903 Segmental and somatic dysfunction of lumbar region: Secondary | ICD-10-CM | POA: Diagnosis not present

## 2020-01-04 DIAGNOSIS — M9903 Segmental and somatic dysfunction of lumbar region: Secondary | ICD-10-CM | POA: Diagnosis not present

## 2020-01-04 DIAGNOSIS — S338XXA Sprain of other parts of lumbar spine and pelvis, initial encounter: Secondary | ICD-10-CM | POA: Diagnosis not present

## 2020-01-04 DIAGNOSIS — S2341XA Sprain of ribs, initial encounter: Secondary | ICD-10-CM | POA: Diagnosis not present

## 2020-01-04 DIAGNOSIS — M9902 Segmental and somatic dysfunction of thoracic region: Secondary | ICD-10-CM | POA: Diagnosis not present

## 2020-01-06 DIAGNOSIS — E785 Hyperlipidemia, unspecified: Secondary | ICD-10-CM | POA: Diagnosis not present

## 2020-01-06 DIAGNOSIS — E039 Hypothyroidism, unspecified: Secondary | ICD-10-CM | POA: Diagnosis not present

## 2020-01-06 DIAGNOSIS — I10 Essential (primary) hypertension: Secondary | ICD-10-CM | POA: Diagnosis not present

## 2020-01-06 DIAGNOSIS — K219 Gastro-esophageal reflux disease without esophagitis: Secondary | ICD-10-CM | POA: Diagnosis not present

## 2020-01-10 DIAGNOSIS — S338XXA Sprain of other parts of lumbar spine and pelvis, initial encounter: Secondary | ICD-10-CM | POA: Diagnosis not present

## 2020-01-10 DIAGNOSIS — M9903 Segmental and somatic dysfunction of lumbar region: Secondary | ICD-10-CM | POA: Diagnosis not present

## 2020-01-10 DIAGNOSIS — S2341XA Sprain of ribs, initial encounter: Secondary | ICD-10-CM | POA: Diagnosis not present

## 2020-01-10 DIAGNOSIS — M9902 Segmental and somatic dysfunction of thoracic region: Secondary | ICD-10-CM | POA: Diagnosis not present

## 2020-01-11 ENCOUNTER — Telehealth: Payer: Self-pay | Admitting: Neurology

## 2020-01-11 NOTE — Telephone Encounter (Signed)
Pt states the Mirapex she taking TID of 0.125 mg helps but she is having some break through pain. Pt wanted to know if she up increase how often she takes it to QID or what should she do?    Pt given the Referral number to call and check on.

## 2020-01-11 NOTE — Telephone Encounter (Signed)
The following message was left with [facility] on 01/11/20 at 12:57 PM.  Caller states she recently switched to pramipexole for her RLS and it doesn't seem to be working. Can she increase her dosage?

## 2020-01-12 ENCOUNTER — Other Ambulatory Visit: Payer: Self-pay | Admitting: Neurology

## 2020-01-12 DIAGNOSIS — S2341XA Sprain of ribs, initial encounter: Secondary | ICD-10-CM | POA: Diagnosis not present

## 2020-01-12 DIAGNOSIS — M9902 Segmental and somatic dysfunction of thoracic region: Secondary | ICD-10-CM | POA: Diagnosis not present

## 2020-01-12 DIAGNOSIS — M9903 Segmental and somatic dysfunction of lumbar region: Secondary | ICD-10-CM | POA: Diagnosis not present

## 2020-01-12 DIAGNOSIS — S338XXA Sprain of other parts of lumbar spine and pelvis, initial encounter: Secondary | ICD-10-CM | POA: Diagnosis not present

## 2020-01-12 MED ORDER — PRAMIPEXOLE DIHYDROCHLORIDE 0.125 MG PO TABS: 0.1250 mg | ORAL_TABLET | Freq: Four times a day (QID) | ORAL | 5 refills | Status: DC

## 2020-01-12 NOTE — Telephone Encounter (Signed)
I need clarification regarding which medication she is taking.  When I last saw her, she was supposed to be taking Mirapex (pramipexole) 0.125mg  three times daily.  But she told me she was still taking the ropinirole 2mg  three times daily.  Is she taking the Mirapex or ropinirole?  She shouldn't be taking both.  If she is talking about her restless leg symptoms, she may take a fourth 0.125mg  Mirapex during the day.  If she is talking about pain, that is a different matter that would need evaluation.

## 2020-01-12 NOTE — Telephone Encounter (Signed)
Sent script for 4 times daily

## 2020-01-12 NOTE — Telephone Encounter (Signed)
Pt advised to take a fourth Mirapex 0.125 mg   Pt wanted to know if Dr. Tomi Likens could send in another script stating that. Please advise.

## 2020-01-13 DIAGNOSIS — S338XXA Sprain of other parts of lumbar spine and pelvis, initial encounter: Secondary | ICD-10-CM | POA: Diagnosis not present

## 2020-01-13 DIAGNOSIS — S2341XA Sprain of ribs, initial encounter: Secondary | ICD-10-CM | POA: Diagnosis not present

## 2020-01-13 DIAGNOSIS — M9903 Segmental and somatic dysfunction of lumbar region: Secondary | ICD-10-CM | POA: Diagnosis not present

## 2020-01-13 DIAGNOSIS — M9902 Segmental and somatic dysfunction of thoracic region: Secondary | ICD-10-CM | POA: Diagnosis not present

## 2020-01-14 DIAGNOSIS — E1165 Type 2 diabetes mellitus with hyperglycemia: Secondary | ICD-10-CM | POA: Diagnosis not present

## 2020-01-14 DIAGNOSIS — J449 Chronic obstructive pulmonary disease, unspecified: Secondary | ICD-10-CM | POA: Diagnosis not present

## 2020-01-14 DIAGNOSIS — Z87891 Personal history of nicotine dependence: Secondary | ICD-10-CM | POA: Diagnosis not present

## 2020-01-14 DIAGNOSIS — J019 Acute sinusitis, unspecified: Secondary | ICD-10-CM | POA: Diagnosis not present

## 2020-01-14 DIAGNOSIS — Z299 Encounter for prophylactic measures, unspecified: Secondary | ICD-10-CM | POA: Diagnosis not present

## 2020-01-21 DIAGNOSIS — M9902 Segmental and somatic dysfunction of thoracic region: Secondary | ICD-10-CM | POA: Diagnosis not present

## 2020-01-21 DIAGNOSIS — S2341XA Sprain of ribs, initial encounter: Secondary | ICD-10-CM | POA: Diagnosis not present

## 2020-01-21 DIAGNOSIS — S338XXA Sprain of other parts of lumbar spine and pelvis, initial encounter: Secondary | ICD-10-CM | POA: Diagnosis not present

## 2020-01-21 DIAGNOSIS — M9903 Segmental and somatic dysfunction of lumbar region: Secondary | ICD-10-CM | POA: Diagnosis not present

## 2020-01-24 DIAGNOSIS — M9903 Segmental and somatic dysfunction of lumbar region: Secondary | ICD-10-CM | POA: Diagnosis not present

## 2020-01-24 DIAGNOSIS — S338XXA Sprain of other parts of lumbar spine and pelvis, initial encounter: Secondary | ICD-10-CM | POA: Diagnosis not present

## 2020-01-24 DIAGNOSIS — S2341XA Sprain of ribs, initial encounter: Secondary | ICD-10-CM | POA: Diagnosis not present

## 2020-01-24 DIAGNOSIS — M9902 Segmental and somatic dysfunction of thoracic region: Secondary | ICD-10-CM | POA: Diagnosis not present

## 2020-01-25 DIAGNOSIS — E039 Hypothyroidism, unspecified: Secondary | ICD-10-CM | POA: Diagnosis not present

## 2020-01-25 DIAGNOSIS — F331 Major depressive disorder, recurrent, moderate: Secondary | ICD-10-CM | POA: Diagnosis not present

## 2020-01-25 DIAGNOSIS — F419 Anxiety disorder, unspecified: Secondary | ICD-10-CM | POA: Diagnosis not present

## 2020-01-25 DIAGNOSIS — M160 Bilateral primary osteoarthritis of hip: Secondary | ICD-10-CM | POA: Diagnosis not present

## 2020-01-25 DIAGNOSIS — M25552 Pain in left hip: Secondary | ICD-10-CM | POA: Diagnosis not present

## 2020-01-28 DIAGNOSIS — M9902 Segmental and somatic dysfunction of thoracic region: Secondary | ICD-10-CM | POA: Diagnosis not present

## 2020-01-28 DIAGNOSIS — S338XXA Sprain of other parts of lumbar spine and pelvis, initial encounter: Secondary | ICD-10-CM | POA: Diagnosis not present

## 2020-01-28 DIAGNOSIS — M9903 Segmental and somatic dysfunction of lumbar region: Secondary | ICD-10-CM | POA: Diagnosis not present

## 2020-01-28 DIAGNOSIS — S2341XA Sprain of ribs, initial encounter: Secondary | ICD-10-CM | POA: Diagnosis not present

## 2020-01-31 DIAGNOSIS — S2341XA Sprain of ribs, initial encounter: Secondary | ICD-10-CM | POA: Diagnosis not present

## 2020-01-31 DIAGNOSIS — M9903 Segmental and somatic dysfunction of lumbar region: Secondary | ICD-10-CM | POA: Diagnosis not present

## 2020-01-31 DIAGNOSIS — M9902 Segmental and somatic dysfunction of thoracic region: Secondary | ICD-10-CM | POA: Diagnosis not present

## 2020-01-31 DIAGNOSIS — S338XXA Sprain of other parts of lumbar spine and pelvis, initial encounter: Secondary | ICD-10-CM | POA: Diagnosis not present

## 2020-02-04 DIAGNOSIS — E785 Hyperlipidemia, unspecified: Secondary | ICD-10-CM | POA: Diagnosis not present

## 2020-02-04 DIAGNOSIS — M25552 Pain in left hip: Secondary | ICD-10-CM | POA: Diagnosis not present

## 2020-02-04 DIAGNOSIS — I10 Essential (primary) hypertension: Secondary | ICD-10-CM | POA: Diagnosis not present

## 2020-02-04 DIAGNOSIS — K219 Gastro-esophageal reflux disease without esophagitis: Secondary | ICD-10-CM | POA: Diagnosis not present

## 2020-02-04 DIAGNOSIS — E039 Hypothyroidism, unspecified: Secondary | ICD-10-CM | POA: Diagnosis not present

## 2020-02-07 DIAGNOSIS — S2341XA Sprain of ribs, initial encounter: Secondary | ICD-10-CM | POA: Diagnosis not present

## 2020-02-07 DIAGNOSIS — M9903 Segmental and somatic dysfunction of lumbar region: Secondary | ICD-10-CM | POA: Diagnosis not present

## 2020-02-07 DIAGNOSIS — S338XXA Sprain of other parts of lumbar spine and pelvis, initial encounter: Secondary | ICD-10-CM | POA: Diagnosis not present

## 2020-02-07 DIAGNOSIS — M9902 Segmental and somatic dysfunction of thoracic region: Secondary | ICD-10-CM | POA: Diagnosis not present

## 2020-02-10 DIAGNOSIS — H40033 Anatomical narrow angle, bilateral: Secondary | ICD-10-CM | POA: Diagnosis not present

## 2020-02-10 DIAGNOSIS — H04123 Dry eye syndrome of bilateral lacrimal glands: Secondary | ICD-10-CM | POA: Diagnosis not present

## 2020-02-11 DIAGNOSIS — M25552 Pain in left hip: Secondary | ICD-10-CM | POA: Diagnosis not present

## 2020-02-18 ENCOUNTER — Encounter: Payer: Self-pay | Admitting: Pulmonary Disease

## 2020-02-18 ENCOUNTER — Other Ambulatory Visit: Payer: Self-pay

## 2020-02-18 ENCOUNTER — Ambulatory Visit: Payer: PPO | Admitting: Pulmonary Disease

## 2020-02-18 VITALS — BP 112/66 | HR 98 | Temp 97.0°F | Ht 66.5 in | Wt 140.8 lb

## 2020-02-18 DIAGNOSIS — G4731 Primary central sleep apnea: Secondary | ICD-10-CM

## 2020-02-18 DIAGNOSIS — G4733 Obstructive sleep apnea (adult) (pediatric): Secondary | ICD-10-CM | POA: Diagnosis not present

## 2020-02-18 DIAGNOSIS — M25552 Pain in left hip: Secondary | ICD-10-CM | POA: Diagnosis not present

## 2020-02-18 NOTE — Progress Notes (Signed)
[facility]  Chief Complaint  Patient presents with  . Consult    Sleep consult    Constitutional:  BP 112/66 (BP Location: Left Arm, Cuff Size: Normal)   Pulse 98   Temp (!) 97 F (36.1 C) (Other (Comment)) Comment (Src): wrist  Ht 5' 6.5" (1.689 m)   Wt 140 lb 12.8 oz (63.9 kg)   SpO2 99% Comment: Room air  BMI 22.39 kg/m   Past Medical History:  IBS, Migraine headaches, Depression, Anxiety, Bipolar, Restless leg syndrome, Parasthesia, Pericarditis 2015, HLD, Hypothyroidism  Past Surgical History:  Her  has a past surgical history that includes Breast surgery; Cervical fusion; Cholecystectomy; Nasal sinus surgery; Vaginal hysterectomy; Colonoscopy with propofol (N/A, 01/16/2018); and polypectomy (01/16/2018).  Brief Summary:  Teresa Galloway is a 64 y.o. female former smoker obstructive sleep apnea.       Subjective:   She is followed by Dr. Tomi Likens for RLS and paresthesia.  She has trouble with her sleep.  She had several sleeps studies over the years reportedly showing obstructive and central sleep apnea.  She was tried on CPAP and Bipap and couldn't tolerate either of these.  She was also was tried on supplemental oxygen with improvement.  She then tried an oral appliance, but this caused terrible problem with her TMJ.  She continues to snore and wakes up with a gasp.  Her mother says she will stop breathing.  She has neck and hip problems.  As a result she has to sleep on her left side.  She needs to nap in the afternoons.  She has a mouth guard for bruxism.  She goes to sleep at 11 pm.  She falls asleep in 10 minutes.  She wakes up several times to use the bathroom.  She gets out of bed at 8 am.  She feels tired in the morning.  She denies morning headache.  She does not use anything to help her fall sleep.  She drinks coffee in the morning.  She denies sleep walking, or nightmares.  She denies sleep hallucinations, sleep  paralysis, or cataplexy.  The Epworth score is 10 out of 24.    Physical Exam:   Appearance - well kempt   ENMT - no sinus tenderness, no oral exudate, no LAN, Mallampati 2 airway, no stridor, decreased AP diameter, low laying soft palate  Respiratory - equal breath sounds bilaterally, no wheezing or rales  CV - s1s2 regular rate and rhythm, no murmurs  Ext - no clubbing, no edema  Skin - no rashes  Psych - normal mood and affect   Sleep Tests:    Social History:  She  reports that she quit smoking about 7 years ago. Her smoking use included cigarettes. She started smoking about 44 years ago. She has a 30.00 pack-year smoking history. She has never used smokeless tobacco. She reports current alcohol use of about 3.0 standard drinks of alcohol per week. She reports that she does not use drugs.  Family History:  Her family history includes Alcohol abuse in her maternal grandfather, maternal uncle, and paternal uncle; Anxiety disorder in her sister; Depression in her father, maternal grandmother, mother, and sister.    Discussion:  She has sleep disruption, snoring, apnea, and daytime sleepiness.  She has history of mood disorders, bruxism and restless leg syndrome.  She reports having previous sleep studies showing obstructive and central sleep apnea.  She has previously been tried on CPAP and Bipap  and would not want to use these again.  She was tried on mandibular advancement device, but developed arthralgia of her TMJ.  She would be interested in an Inspire device if she is a suitable candidate.  Assessment/Plan:   Sleep apnea. - will get copy of her previous sleep studies - given that she has obstructive and central sleep apnea, restless leg syndrome with possible periodic limb movements, and bruxism it is my medical opinion that she needs to have an in lab sleep study to further assess her sleep issues  Restless syndrome, paresthesia. - followed by Dr. Tomi Likens with [facility]  Time Spent Involved in Patient Care on Day of Examination:  46 minutes  Follow up:  Patient Instructions  Will have you sign release form to get copy of your most recent sleep studies   Will arrange for in lab sleep study  Will call to arrange for follow up after sleep study reviewed    Medication List:   Allergies as of 02/18/2020      Reactions   Other Anaphylaxis, Swelling, Other (See Comments)   Walnuts   Sulfa Antibiotics Shortness Of Breath, Other (See Comments)   Swelling of lips    Sunscreens Anaphylaxis, Hives, Other (See Comments)   Titanium and zinc oxides Eye swelling    Seroquel [quetiapine] Other (See Comments)   Pychosis (foggy, uncoordinated, slurred speech)   Tape Hives      Medication List       Accurate as of February 18, 2020 10:47 AM. If you have any questions, ask your nurse or doctor.        STOP taking these medications   AMBULATORY NON FORMULARY MEDICATION Stopped by: Chesley Mires, MD   cephALEXin 500 MG capsule Commonly known as: KEFLEX Stopped by: Chesley Mires, MD   cetirizine 10 MG tablet Commonly known as: ZYRTEC Stopped by: Chesley Mires, MD   cimetidine 300 MG tablet Commonly known as: TAGAMET Stopped by: Chesley Mires, MD   levofloxacin 500 MG tablet Commonly known as: LEVAQUIN Stopped by: Chesley Mires, MD   Requip 2 MG tablet Generic drug: rOPINIRole Stopped by: Chesley Mires, MD   Vitamin D3 25 MCG (1000 UT) Caps Stopped by: Chesley Mires, MD     TAKE these medications   ALPRAZolam 0.25 MG tablet Commonly known as: XANAX Take 0.25 mg by mouth 4 (four) times daily as needed for anxiety.   Avenova 0.01 % Soln Apply 1 application topically 2 (two) times daily.   betamethasone dipropionate 0.05 % cream Apply topically 2 (two) times daily.   citalopram 40 MG tablet Commonly known as: CELEXA Take 40 mg by mouth daily.   diclofenac sodium 1 % Gel Commonly known as: VOLTAREN Apply 2 g topically 3 (three)  times daily as needed (PRN for arthritis pain in hands, knees and shoulder-uses about 1 tube/month).   dicyclomine 10 MG capsule Commonly known as: BENTYL Take 1 capsule (10 mg total) by mouth 3 (three) times daily before meals.   EpiPen 2-Pak 0.3 mg/0.3 mL Soaj injection Generic drug: EPINEPHrine Inject 0.3 mg into the muscle once.   lamoTRIgine 25 MG tablet Commonly known as: LAMICTAL Take 100 mg by mouth at bedtime.   levothyroxine 75 MCG tablet Commonly known as: SYNTHROID Take 75 mcg by mouth daily before breakfast.   meloxicam 15 MG tablet Commonly known as: MOBIC Take 15 mg by mouth daily.   olopatadine 0.1 % ophthalmic solution Commonly known as: PATANOL Place 1 drop into both  eyes 2 (two) times daily.   omeprazole 20 MG capsule Commonly known as: PRILOSEC Take 20 mg by mouth daily.   ondansetron 4 MG tablet Commonly known as: ZOFRAN Take 4 mg by mouth every 8 (eight) hours as needed for nausea or vomiting.   pramipexole 0.125 MG tablet Commonly known as: MIRAPEX Take 1 tablet (0.125 mg total) by mouth in the morning, at noon, in the evening, and at bedtime.   pregabalin 75 MG capsule Commonly known as: Lyrica Take 1 capsule (75 mg total) by mouth 3 (three) times daily.   propranolol 60 MG tablet Commonly known as: INDERAL Take 60 mg by mouth daily.   rizatriptan 10 MG tablet Commonly known as: MAXALT Take 10 mg by mouth as needed for migraine. May repeat in 2 hours if needed   SYSTANE COMPLETE OP Apply 1 drop to eye 4 (four) times daily. Also uses prn   Xiidra 5 % Soln Generic drug: Lifitegrast Apply 1 drop to eye 2 (two) times daily.       Signature:  Chesley Mires, MD [place] Pager - [phone] 02/18/2020, 10:47 AM

## 2020-02-18 NOTE — Patient Instructions (Signed)
Will have you sign release form to get copy of your most recent sleep studies   Will arrange for in lab sleep study  Will call to arrange for follow up after sleep study reviewed

## 2020-02-19 NOTE — Telephone Encounter (Signed)
error 

## 2020-02-24 DIAGNOSIS — Z1339 Encounter for screening examination for other mental health and behavioral disorders: Secondary | ICD-10-CM | POA: Diagnosis not present

## 2020-02-24 DIAGNOSIS — Z299 Encounter for prophylactic measures, unspecified: Secondary | ICD-10-CM | POA: Diagnosis not present

## 2020-02-24 DIAGNOSIS — Z1331 Encounter for screening for depression: Secondary | ICD-10-CM | POA: Diagnosis not present

## 2020-02-24 DIAGNOSIS — Z79899 Other long term (current) drug therapy: Secondary | ICD-10-CM | POA: Diagnosis not present

## 2020-02-24 DIAGNOSIS — R5383 Other fatigue: Secondary | ICD-10-CM | POA: Diagnosis not present

## 2020-02-24 DIAGNOSIS — I10 Essential (primary) hypertension: Secondary | ICD-10-CM | POA: Diagnosis not present

## 2020-02-24 DIAGNOSIS — E7800 Pure hypercholesterolemia, unspecified: Secondary | ICD-10-CM | POA: Diagnosis not present

## 2020-02-24 DIAGNOSIS — Z7189 Other specified counseling: Secondary | ICD-10-CM | POA: Diagnosis not present

## 2020-02-24 DIAGNOSIS — Z0000 Encounter for general adult medical examination without abnormal findings: Secondary | ICD-10-CM | POA: Diagnosis not present

## 2020-02-24 DIAGNOSIS — E039 Hypothyroidism, unspecified: Secondary | ICD-10-CM | POA: Diagnosis not present

## 2020-03-07 ENCOUNTER — Telehealth: Payer: Self-pay | Admitting: Pulmonary Disease

## 2020-03-07 ENCOUNTER — Telehealth: Payer: Self-pay | Admitting: Neurology

## 2020-03-07 NOTE — Telephone Encounter (Signed)
Pt states she just do not feel comfortable after her research on it.   Advised pt to call the provider who is doing the sleep studies.

## 2020-03-07 NOTE — Telephone Encounter (Signed)
ATC patient, let vm for patient to return call.

## 2020-03-07 NOTE — Telephone Encounter (Signed)
I am not familiar with the Inspire device so I really would defer to the sleep specialist.

## 2020-03-07 NOTE — Telephone Encounter (Signed)
Called and spoke with patient she states that she is calling about sleep study that is scheduled for 03/12/20. Patient would like to talk to Dr. Halford Chessman prior to this. Patient states that she did some research on the [facility] and wants to discuss that with him before she decides not going to use Inspire. Patient is thinking about canceling but wanted to talk to Dr. Halford Chessman prior to doing so. Please advise [phone]   Dr. Halford Chessman please advise

## 2020-03-07 NOTE — Telephone Encounter (Signed)
Patient was referred for a sleep study by Dr Tomi Likens to see if she would qualify for [facility]. She states she has done some research on it and now isn't sure if she wants to go that route, but would like to speak with Dr Tomi Likens regarding her hesitations and his opinion. Please call.

## 2020-03-07 NOTE — Telephone Encounter (Signed)
Patient is returning phone call, Patient phone number is [phone].

## 2020-03-08 DIAGNOSIS — M25552 Pain in left hip: Secondary | ICD-10-CM | POA: Diagnosis not present

## 2020-03-08 NOTE — Telephone Encounter (Signed)
Spoke with pt.  She reviewed more information about Inspire device.  She is concerned that she wouldn't be able to do an MRI in the future.  As such she would not want to have an Inspire device implanted if she was a candidate.  She has been tried on CPAP, Bipap, supplemental oxygen and oral appliance.  None of these were tolerable.  She would not want to have any other surgical interventions for sleep apnea.  As such she has opted to cancel sleep study scheduled for 03/12/20 since she wouldn't want to try any of the therapies available for sleep apnea.  Please cancel sleep study that is scheduled for 03/12/20.

## 2020-03-08 NOTE — Telephone Encounter (Signed)
Per Dr. Halford Chessman sleep study has been canceled. Nothing further needed at this time.

## 2020-04-13 DIAGNOSIS — M25552 Pain in left hip: Secondary | ICD-10-CM | POA: Diagnosis not present

## 2020-04-13 DIAGNOSIS — M7062 Trochanteric bursitis, left hip: Secondary | ICD-10-CM | POA: Diagnosis not present

## 2020-05-04 NOTE — Progress Notes (Signed)
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   UPDATE: Current medications: Mirapex 0.125mg  QID; alprazolam 0.25mg  infrequently but may be takenfour times daily PRN; citalopram 40mg  daily; rizatriptan 10mg ; propranolol 60mg  daily; lamotrigine 100mg  at bedtime; levothyroxine, Zofran  1. Restless Leg Syndrome: Current medication: She reports that she went back on Mirapex.  Due to breakthrough restless leg symptoms, increased to 0.125mg  from TID to QID. However, she reports increased sweating since increasing the dose.     2. Left arm pain and paresthesias:  Deferred epidural injection.  She has seen a chiropractor which helps.  3.  Subjective memory deficits: Applying tools discussed with Dr. Melvyn Novas to address memory and depression.  Able to concentrate and read again.  Playing more strategy games.    4. Obstructive sleep apnea: She saw pulmonology/sleep medicine.  Dawna Part was recommended but she declined because she wouldn't be able to get an MRI if every needed.  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 She stopped Lithium. However, she continues to have profuse sweating, tingling itch across the face and hands (like a feather running across her skin.   She still has tingling and aching pain radiating down the left arm from shoulder down to the entire hand. No  weakness. No neck pain. She does have history of cervical C5-C6-C7 spine fusion 10 years ago.  01/28/2019 NCV-EMG: normal.  02/19/2019 MRI Cervical Spine Wo: ACDF C5-6 and C6-7 without stenosis; 2 mm anterolisthesis with central disc protrusion, uncinate spurring and moderate facet degeneration at C4-5 causing mild spinal stenosis and moderate foraminal stenosis bilaterally. She started seeing a chiropractor which has been helpful. If she turns her head to the left, she feels the pain and numbness down the left shoulder and to the fingertips.   She has restless leg syndrome which lasts all day.  12/30/2018 LABS: Ferritin 65, TSH 1.00, B12 >2,000.   She continued to have short-term memory deficits.12/30/2018 LABS: TSH 1.00, B12 >2,000.  She underwent neuropsychological testing with Dr. Melvyn Novas on 03/25/2019, which did not demonstrate evidence of neurocognitive deficits. Symptoms were thought to be possibly related to severe depression and anxiety and pharmacologic effect.   Past medications: lithium; gabapentin (muscle jerks), Lyrica (sleepiness, sleep walking); ropinrole (lost efficacy), Cymbalta.  PAST MEDICAL HISTORY: Past Medical History:  Diagnosis Date  . Acute pericarditis 12/2013  . Bipolar II disorder    Possible diagnosis  . Complication of anesthesia    Effects lasted for 3 days  . Dyslipidemia   . Generalized anxiety disorder with panic attacks   . Hx of lithium toxicity   . Hx of migraines    On propranolol.  . Hypothyroid   . Major depressive disorder 04/16/2013  . Obstructive sleep apnea    Currently untreatred; oral device caused TMJ; unsuccessful with CPAP  . Pericardial effusion 01/03/2014   Moderate without hemodynamic compromise.  . Sinus tachycardia     MEDICATIONS: Current Outpatient Medications on File Prior to Visit  Medication Sig Dispense Refill  . ALPRAZolam (XANAX) 0.25 MG tablet Take 0.25 mg by mouth 4 (four) times daily as needed for  anxiety.     . betamethasone dipropionate 0.05 % cream Apply topically 2 (two) times daily. 30 g 2  . citalopram (CELEXA) 40 MG tablet Take 40 mg by mouth daily.     . diclofenac sodium (VOLTAREN) 1 % GEL Apply 2 g topically 3 (three) times daily as needed (PRN for arthritis pain in hands, knees and shoulder-uses about 1 tube/month).    . dicyclomine (BENTYL) 10 MG capsule Take 1 capsule (10 mg total) by mouth 3 (three) times daily before meals. 90 capsule 4  . EPIPEN 2-PAK 0.3 MG/0.3ML SOAJ injection Inject 0.3 mg into the muscle once.   1  . Eyelid Cleansers (AVENOVA) 0.01 % SOLN Apply 1 application topically 2 (two) times daily.     Marland Kitchen lamoTRIgine (LAMICTAL) 25 MG tablet Take 100 mg by mouth at bedtime.    Marland Kitchen levothyroxine (SYNTHROID, LEVOTHROID) 75 MCG tablet Take 75 mcg by mouth daily before breakfast.    . Lifitegrast (XIIDRA) 5 % SOLN Apply 1 drop to eye 2 (two) times daily.    . meloxicam (MOBIC) 15 MG tablet Take 15 mg by mouth daily.    Marland Kitchen olopatadine (PATANOL) 0.1 % ophthalmic solution Place 1 drop into both eyes 2 (two) times daily.     Marland Kitchen omeprazole (PRILOSEC) 20 MG capsule Take 20 mg by mouth daily.    . ondansetron (ZOFRAN) 4 MG tablet Take 4 mg by mouth every 8 (eight) hours as needed for nausea or vomiting.    . pramipexole (MIRAPEX) 0.125 MG tablet Take 1 tablet (0.125 mg total) by mouth in the morning, at noon, in the evening, and at bedtime. 120 tablet 5  . pregabalin (LYRICA) 75 MG capsule Take 1 capsule (75 mg total) by mouth 3 (three) times daily. (Patient not taking: Reported on 06/24/2019) 270 capsule 3  . propranolol (INDERAL) 60 MG tablet Take 60 mg by mouth daily.    Marland Kitchen Propylene Glycol (SYSTANE COMPLETE OP) Apply 1 drop to eye 4 (four) times daily. Also uses prn (Patient not taking: Reported on 02/18/2020)    . rizatriptan (MAXALT) 10 MG tablet Take 10 mg by mouth as needed for migraine. May repeat in 2 hours if needed     No current facility-administered medications on  file prior to visit.    ALLERGIES: Allergies  Allergen Reactions  . Other Anaphylaxis, Swelling and Other (See Comments)    Walnuts  . Sulfa Antibiotics Shortness Of Breath and Other (See Comments)    Swelling of lips   . Sunscreens Anaphylaxis, Hives and Other (See Comments)    Titanium and zinc oxides Eye swelling   . Seroquel [Quetiapine] Other (See Comments)    Pychosis (foggy, uncoordinated, slurred speech)  . Tape Hives    FAMILY HISTORY: Family History  Problem Relation Age of Onset  . Depression Mother   . Depression Father   . Depression Sister   . Anxiety disorder Sister   . Alcohol abuse Maternal Uncle   . Alcohol abuse Paternal Uncle   . Alcohol abuse Maternal Grandfather   . Depression Maternal Grandmother     SOCIAL HISTORY: Social History   Socioeconomic History  . Marital status: Divorced    Spouse name: Not on file  . Number of children: 0  . Years of education: 19  . Highest education level: Some college, no degree  Occupational History  . Occupation: Disability  Tobacco Use  . Smoking status: Former Smoker    Packs/day: 1.00    Years: 30.00    Pack years: 30.00    Types: Cigarettes    Start date: 05/13/1975    Quit date: 07/07/2012    Years since quitting: 7.8  . Smokeless tobacco: Never Used  Vaping Use  . Vaping Use: Never used  Substance and Sexual Activity  . Alcohol use: Yes    Alcohol/week: 3.0 standard drinks    Types: 3 Glasses of wine per week  . Drug use: No  . Sexual activity: Not Currently    Partners: Male    Birth control/protection: Surgical  Other Topics Concern  . Not on file  Social History Narrative   She lives alone. One story home she has no children   Has some college education   Right handed   Social Determinants of Health   Financial Resource Strain: Not on file  Food Insecurity: Not on file  Transportation Needs: Not on file  Physical Activity: Not on file  Stress: Not on file  Social Connections: Not  on file  Intimate Partner Violence: Not on file     Objective:  Blood pressure 136/80, pulse 95, resp. rate 20, height 5\' 6"  (1.676 m), weight 142 lb (64.4 kg), SpO2 99 %. General: No acute distress.  Patient appears well-groomed.   Head:  Normocephalic/atraumatic Eyes:  Fundi examined but not visualized Neck: supple, no paraspinal tenderness, full range of motion Heart:  Regular rate and rhythm Lungs:  Clear to auscultation bilaterally Back: No paraspinal tenderness Neurological Exam: alert and oriented to person, place, and time. Attention span and concentration intact, recent and remote memory intact, fund of knowledge intact.  Speech fluent and not dysarthric, language intact.  CN II-XII intact. Bulk and tone normal, muscle strength 5/5 throughout.  Sensation to light touch  intact.  Deep tendon reflexes 2+ throughout, toes downgoing.  Finger to nose and heel to shin testing intact.  Gait normal, Romberg negative.   Assessment/Plan:   1.  Restless leg syndrome - complicated by augmentation to pramipexole. 2.  Left arm pain and paresthesias.  No evidence of mononeuropathy (carpal tunnel syndrome, ulnar neuropathy, etc) or cervical radiculopathy on testing. However, symptoms sound radicular.MRI of cervical spine does reveal multifactorial neural foraminal and mild spinal stenosis at C4-5 which does not correlate with dermatome of her symptoms and did not respond to C5 epidural injection. 3.  Subjective memory deficits, multifactorial related to depression/anxiety, pharmacologic effect and poorly controled complex sleep apnea.No evidence of neurocognitive disorder.  4.  OSA  Plan would be to taper off of pramipexole: -  Restart Lyrica at 50mg  twice daily (she is agreeable to retry Lyrica) and reduced pramipexole to three times daily. -  In one month, will reduce pramipexole to twice daily.  If RLS symptoms aggravated, will increase Lyrica to 50mg  three times daily - In another month,  will reduce pramipexole to once daily at bedtime.  If needed, we can increase Lyrica to 75mg  three times daily. - In another month, plan would be to discontinue pramipexole and remain on Lyrica - may increase dose of Lyrica if needed. 2.  Follow up in 6 months.  Metta Clines, DO  CC: Jerene Bears, MD

## 2020-05-05 ENCOUNTER — Ambulatory Visit: Payer: PPO | Admitting: Neurology

## 2020-05-05 ENCOUNTER — Encounter: Payer: Self-pay | Admitting: Neurology

## 2020-05-05 ENCOUNTER — Other Ambulatory Visit: Payer: Self-pay

## 2020-05-05 VITALS — BP 136/80 | HR 95 | Resp 20 | Ht 66.0 in | Wt 142.0 lb

## 2020-05-05 DIAGNOSIS — G2581 Restless legs syndrome: Secondary | ICD-10-CM

## 2020-05-05 MED ORDER — PREGABALIN 50 MG PO CAPS
50.0000 mg | ORAL_CAPSULE | Freq: Two times a day (BID) | ORAL | 5 refills | Status: DC
Start: 2020-05-05 — End: 2020-05-29

## 2020-05-05 NOTE — Patient Instructions (Signed)
1.  Start pregablin 50mg  twice daily and decrease Mirapex to three times daily 2.  In one month, plan will be to decrease Mirapex to twice daily and we can increase pregablin IF NEEDED 3.  Follow up 6 months

## 2020-05-08 DIAGNOSIS — K219 Gastro-esophageal reflux disease without esophagitis: Secondary | ICD-10-CM | POA: Diagnosis not present

## 2020-05-08 DIAGNOSIS — E039 Hypothyroidism, unspecified: Secondary | ICD-10-CM | POA: Diagnosis not present

## 2020-05-08 DIAGNOSIS — I10 Essential (primary) hypertension: Secondary | ICD-10-CM | POA: Diagnosis not present

## 2020-05-08 DIAGNOSIS — E785 Hyperlipidemia, unspecified: Secondary | ICD-10-CM | POA: Diagnosis not present

## 2020-05-23 DIAGNOSIS — M25552 Pain in left hip: Secondary | ICD-10-CM | POA: Diagnosis not present

## 2020-05-23 DIAGNOSIS — M7062 Trochanteric bursitis, left hip: Secondary | ICD-10-CM | POA: Diagnosis not present

## 2020-05-29 ENCOUNTER — Telehealth: Payer: Self-pay

## 2020-05-29 ENCOUNTER — Other Ambulatory Visit: Payer: Self-pay | Admitting: Neurology

## 2020-05-29 MED ORDER — PREGABALIN 75 MG PO CAPS
75.0000 mg | ORAL_CAPSULE | Freq: Two times a day (BID) | ORAL | 1 refills | Status: DC
Start: 2020-05-29 — End: 2020-07-06

## 2020-05-29 NOTE — Telephone Encounter (Signed)
I want her to decrease pramipexole to 1 tablet twice daily.  Does she want to remain on 50mg  twice daily or increase to 50mg  three times daily?

## 2020-05-29 NOTE — Telephone Encounter (Signed)
Script for Lyrica 75mg  twice daily 90 day supply sent. If doing well in 4 weeks, I would like her to try decreasing pramipexole to 1 tablet at bedtime

## 2020-05-29 NOTE — Telephone Encounter (Signed)
Pt returned our call, Pt has decreased the Pramipexole to 1 twice a day.Pt reports, She hasn't had any sleep over the last couple of days. So No the lyrica 50 mg BID not helping as much. So  pt wanted to know if she could do 75 BID for now and see how she does.    Per DR.Tomi Likens that would be fine Lyrica 75 mg BID. Please send script to [facility].

## 2020-05-29 NOTE — Telephone Encounter (Signed)
Telephone call to pt, No answer. LMOVM to call the office back.

## 2020-05-29 NOTE — Telephone Encounter (Signed)
Telephone call from pharmacy, Pt wanted to know if she could have a script for 90 day supply.  Advised pharmacist I will ask Dr.Jaffe it looks like pt had a plan to increase over a couple of months. Per Last note: -   Restart Lyrica at 50mg  twice daily (she is agreeable to retry Lyrica) and reduced pramipexole to three times daily. -  In one month, will reduce pramipexole to twice daily.  If RLS symptoms aggravated, will increase Lyrica to 50mg  three times daily - In another month, will reduce pramipexole to once daily at bedtime.  If needed, we can increase Lyrica to 75mg  three times daily. - In another month, plan would be to discontinue pramipexole and remain on Lyrica - may increase dose of Lyrica if needed. 2.  Follow up in 6 months.

## 2020-06-14 DIAGNOSIS — E039 Hypothyroidism, unspecified: Secondary | ICD-10-CM | POA: Diagnosis not present

## 2020-06-14 DIAGNOSIS — Z299 Encounter for prophylactic measures, unspecified: Secondary | ICD-10-CM | POA: Diagnosis not present

## 2020-06-14 DIAGNOSIS — F331 Major depressive disorder, recurrent, moderate: Secondary | ICD-10-CM | POA: Diagnosis not present

## 2020-06-14 DIAGNOSIS — G4730 Sleep apnea, unspecified: Secondary | ICD-10-CM | POA: Diagnosis not present

## 2020-06-14 DIAGNOSIS — I10 Essential (primary) hypertension: Secondary | ICD-10-CM | POA: Diagnosis not present

## 2020-06-20 ENCOUNTER — Telehealth: Payer: Self-pay | Admitting: Neurology

## 2020-06-20 NOTE — Telephone Encounter (Signed)
Telephone call to pt, Pt that about a month ago she had pain starting at the top of her head down to her left eye. No c/o pain down the arm or hand or blurred vision, ringing in the ears.   The pain last at least 3 seconds and go away. No pattern it can come  Multiple  times in a day. Pain could vary depending.

## 2020-06-20 NOTE — Telephone Encounter (Signed)
Patient called in stating she is having sharp pains that start in the top of her head and move down to her left eye.

## 2020-06-21 DIAGNOSIS — M9902 Segmental and somatic dysfunction of thoracic region: Secondary | ICD-10-CM | POA: Diagnosis not present

## 2020-06-21 DIAGNOSIS — S2341XA Sprain of ribs, initial encounter: Secondary | ICD-10-CM | POA: Diagnosis not present

## 2020-06-21 DIAGNOSIS — M9903 Segmental and somatic dysfunction of lumbar region: Secondary | ICD-10-CM | POA: Diagnosis not present

## 2020-06-21 DIAGNOSIS — S338XXA Sprain of other parts of lumbar spine and pelvis, initial encounter: Secondary | ICD-10-CM | POA: Diagnosis not present

## 2020-06-21 NOTE — Telephone Encounter (Signed)
She will need to make an appointment since this is a new issue.

## 2020-06-23 ENCOUNTER — Telehealth (INDEPENDENT_AMBULATORY_CARE_PROVIDER_SITE_OTHER): Payer: PPO | Admitting: Neurology

## 2020-06-23 ENCOUNTER — Encounter: Payer: Self-pay | Admitting: Neurology

## 2020-06-23 ENCOUNTER — Other Ambulatory Visit: Payer: Self-pay

## 2020-06-23 VITALS — Ht 66.0 in | Wt 139.0 lb

## 2020-06-23 DIAGNOSIS — Z79899 Other long term (current) drug therapy: Secondary | ICD-10-CM | POA: Diagnosis not present

## 2020-06-23 DIAGNOSIS — R519 Headache, unspecified: Secondary | ICD-10-CM

## 2020-06-23 MED ORDER — INDOMETHACIN 25 MG PO CAPS
ORAL_CAPSULE | ORAL | 0 refills | Status: DC
Start: 2020-06-23 — End: 2020-12-06

## 2020-06-23 NOTE — Progress Notes (Signed)
Virtual Visit via Video Note The purpose of this virtual visit is to provide medical care while limiting exposure to the novel coronavirus.    Consent was obtained for video visit:  Yes.   Answered questions that patient had about telehealth interaction:  Yes.   I discussed the limitations, risks, security and privacy concerns of performing an evaluation and management service by telemedicine. I also discussed with the patient that there may be a patient responsible charge related to this service. The patient expressed understanding and agreed to proceed.  Pt location: Home Physician Location: office Name of referring provider:  Glenda Chroman, MD I connected with Teresa Galloway at patients initiation/request on 06/23/2020 at  9:10 AM EDT by video enabled telemedicine application and verified that I am speaking with the correct person using two identifiers. Pt MRN:  [id number] Pt DOB:  05/04/1955 Video Participants:  Teresa Galloway  Assessment and Plan:   1.  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  Trigeminal neuralgia of V1 distribution less likely but also in differential.  As these are new headaches, we would need to evaluate/rule out secondary intracranial etiologies such as mass lesion or cerebral aneurysm. - MRI brain w/wo contrast and MRA of head - Start indomethacin 25mg  at bedtime for one week, then 25mg  twice daily for one week, then 25mg  three times daily.  She is already on omeprazole.  If she experiences GI pain/upset, then I would switch to baclofen - 10mg  at bedtime for one week, then 10mg  twice daily for one week, then 10mg  three times daily.  Unfortunately, treatment options are limited.  She has history of side effects to many medications.  She has had adverse reactions to other common treatments for primary stabbing headache (gabapentin and melatonin).  She already is on an antidepressant (citalopram) and antiepileptic (Lyrica). -  I would  also like to check a sed rate and CMP. 2.  Restless leg syndrome - currently undergoing taper of pramipexole.  So far, doing well. - Currently on Lyrica 75mg  twice daily and pramipexole 0.125mg  twice daily.  She will contact us next week with update.  If doing well, I will have her decrease pramipexole to 0.125mg  once daily (with plan to discontinue in another month if tolerated).  If RLS symptoms worsen, we can increase Lyrica to 75mg  in morning and 150mg  at bedtime.  Follow up in August as scheduled.  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    UPDATE: Current medications: pramapexole 0.125mg  BID; alprazolam 0.25mg  infrequently but may be takenfour times daily PRN; citalopram 40mg  daily; rizatriptan 10mg ; propranolol 60mg  daily;lamotrigine 100mg  at bedtime;levothyroxine, Zofran  1. New Onset Headache: Several months ago, she developed a twitch in her left eye.  She reports head pain a month ago  She reports a paroxysmal stabbing pain starting on top left parietal region radiating down to left eye.  It would last a couple of seconds.  It occurs several times a day (6 to 20 times a day).  No associated facial numbness, facial weakness or autonomic symptoms involving the eye.  No triggers.    2.  Restless Leg Syndrome: Due to augmentation, we initiated Lyrica and started tapering off of pramipexole.  Currently on Lyrica 75mg  twice daily and pramipexole twice daily.  She is doing well.    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 She stopped Lithium. However, she continues to have profuse sweating, tingling itch across the face and hands (like a feather running across her skin.   She still has tingling and aching pain radiating down the left arm from shoulder down to the entire hand. No weakness. No neck pain. She does have history of cervical C5-C6-C7 spine fusion 10 years ago. 01/28/2019 NCV-EMG: normal.02/19/2019 MRI Cervical Spine Wo: ACDF C5-6 and C6-7 without stenosis; 2 mm anterolisthesis with central disc protrusion, uncinate spurring and moderate facet degeneration at C4-5 causing mild spinal stenosis and moderate foraminal stenosis bilaterally. She started seeing a chiropractor which has been helpful. If she turns her head to the left, she feels the pain and numbness down the left shoulder and to the fingertips.   She has restless leg syndrome which lasts all day.12/30/2018 LABS: Ferritin 65, TSH 1.00, B12 >2,000.   Shecontinued to [employer] deficits.12/30/2018 LABS: TSH 1.00, B12 >2,000.She underwent neuropsychological testing with Dr. Melvyn Novas on 03/25/2019, which did not demonstrate evidence of neurocognitive deficits. Symptoms were thought to be possibly related to severe depression and anxiety and pharmacologic effect.   Past medications: lithium; gabapentin (muscle jerks), Lyrica (sleepiness, sleep walking); ropinrole (lost efficacy), Cymbalta, melatonin (she feels "wired")  Past Medical History: Past Medical History:  Diagnosis Date  . Acute pericarditis 12/2013  . Bipolar II disorder    Possible diagnosis  . Complication of anesthesia    Effects lasted for 3 days  . Dyslipidemia   . Generalized anxiety disorder with panic attacks   . Hx of lithium toxicity   . Hx of migraines    On propranolol.  . Hypothyroid   . Major depressive disorder 04/16/2013  .  Obstructive sleep apnea    Currently untreatred; oral device caused TMJ; unsuccessful with CPAP  . Pericardial effusion 01/03/2014   Moderate without hemodynamic compromise.  . Sinus tachycardia     Medications: Outpatient Encounter Medications as of 06/23/2020  Medication Sig  . ALPRAZolam (XANAX) 0.25 MG tablet Take 0.25 mg by mouth 4 (four) times daily as needed for anxiety.   . betamethasone dipropionate 0.05 % cream Apply topically 2 (two) times daily.  . citalopram (CELEXA) 40 MG tablet Take 40 mg by mouth daily.   . diclofenac sodium (VOLTAREN) 1 % GEL Apply 2 g topically 3 (three) times daily as needed (PRN for arthritis pain in hands, knees and shoulder-uses about 1 tube/month).  . dicyclomine (BENTYL) 10 MG capsule Take 1 capsule (10 mg total) by mouth 3 (three) times daily before meals.  Marland Kitchen EPIPEN 2-PAK 0.3 MG/0.3ML SOAJ injection Inject 0.3 mg into the muscle once.   . Eyelid Cleansers (AVENOVA) 0.01 % SOLN Apply 1 application topically 2 (two) times daily.   Marland Kitchen lamoTRIgine (LAMICTAL) 25 MG tablet Take by mouth at bedtime. 75mg  bid  . levothyroxine (SYNTHROID, LEVOTHROID) 75 MCG tablet Take 75 mcg by mouth daily before breakfast.  . Lifitegrast 5 % SOLN Apply 1 drop to eye 2 (two) times daily.  . meloxicam (MOBIC) 15 MG tablet Take 15 mg by mouth daily.  Marland Kitchen olopatadine (PATANOL) 0.1 % ophthalmic solution Place  1 drop into both eyes 2 (two) times daily.   Marland Kitchen omeprazole (PRILOSEC) 20 MG capsule Take 20 mg by mouth daily.  . ondansetron (ZOFRAN) 4 MG tablet Take 4 mg by mouth every 8 (eight) hours as needed for nausea or vomiting.  . pramipexole (MIRAPEX) 0.125 MG tablet Take 1 tablet (0.125 mg total) by mouth in the morning, at noon, in the evening, and at bedtime.  . pregabalin (LYRICA) 75 MG capsule Take 1 capsule (75 mg total) by mouth 2 (two) times daily.  . propranolol (INDERAL) 60 MG tablet Take 60 mg by mouth daily.  Marland Kitchen Propylene Glycol (SYSTANE COMPLETE OP) Apply 1 drop to eye  4 (four) times daily. Also uses prn  . rizatriptan (MAXALT) 10 MG tablet Take 10 mg by mouth as needed for migraine. May repeat in 2 hours if needed   No facility-administered encounter medications on file as of 06/23/2020.    Allergies: Allergies  Allergen Reactions  . Other Anaphylaxis, Swelling and Other (See Comments)    Walnuts  . Sulfa Antibiotics Shortness Of Breath and Other (See Comments)    Swelling of lips   . Sunscreens Anaphylaxis, Hives and Other (See Comments)    Titanium and zinc oxides Eye swelling   . Seroquel [Quetiapine] Other (See Comments)    Pychosis (foggy, uncoordinated, slurred speech)  . Tape Hives    Family History: Family History  Problem Relation Age of Onset  . Depression Mother   . Depression Father   . Depression Sister   . Anxiety disorder Sister   . Alcohol abuse Maternal Uncle   . Alcohol abuse Paternal Uncle   . Alcohol abuse Maternal Grandfather   . Depression Maternal Grandmother     Observations/Objective:   Height 5\' 6"  (1.676 m), weight 139 lb (63 kg). No acute distress.  Alert and oriented.  Speech fluent and not dysarthric.  Language intact.    Follow Up Instructions:    -I discussed the assessment and treatment plan with the patient. The patient was provided an opportunity to ask questions and all were answered. The patient agreed with the plan and demonstrated an understanding of the instructions.   The patient was advised to call back or seek an in-person evaluation if the symptoms worsen or if the condition fails to improve as anticipated.   Dudley Major, DO

## 2020-06-27 ENCOUNTER — Other Ambulatory Visit: Payer: Self-pay

## 2020-06-27 ENCOUNTER — Other Ambulatory Visit: Payer: PPO

## 2020-06-27 DIAGNOSIS — Z79899 Other long term (current) drug therapy: Secondary | ICD-10-CM

## 2020-06-27 DIAGNOSIS — R519 Headache, unspecified: Secondary | ICD-10-CM | POA: Diagnosis not present

## 2020-06-27 LAB — COMPREHENSIVE METABOLIC PANEL
ALT: 14 U/L (ref 0–35)
AST: 14 U/L (ref 0–37)
Albumin: 4.4 g/dL (ref 3.5–5.2)
Alkaline Phosphatase: 91 U/L (ref 39–117)
BUN: 20 mg/dL (ref 6–23)
CO2: 30 mEq/L (ref 19–32)
Calcium: 9.3 mg/dL (ref 8.4–10.5)
Chloride: 101 mEq/L (ref 96–112)
Creatinine, Ser: 0.62 mg/dL (ref 0.40–1.20)
GFR: 93.65 mL/min (ref >60.00)
Glucose, Bld: 101 mg/dL — ABNORMAL HIGH (ref 70–99)
Potassium: 4.5 mEq/L (ref 3.5–5.1)
Sodium: 136 mEq/L (ref 135–145)
Total Bilirubin: 0.3 mg/dL (ref 0.2–1.2)
Total Protein: 6.9 g/dL (ref 6.0–8.3)

## 2020-06-27 LAB — SEDIMENTATION RATE: Sed Rate: 14 mm/h (ref 0–30)

## 2020-06-27 NOTE — Addendum Note (Signed)
Addended by: Kaylyn Lim I on: 06/27/2020 01:49 PM   Modules accepted: Orders

## 2020-06-28 NOTE — Progress Notes (Signed)
Pt advised to keep taking the medication If she is still having headaches. And keep her MRI and MRA appt.

## 2020-06-28 NOTE — Progress Notes (Signed)
Pt advised of her lab results.  Pt wanted to know if she needs to stay on the  indomethacin 25mg 

## 2020-06-29 ENCOUNTER — Telehealth: Payer: Self-pay | Admitting: Neurology

## 2020-06-29 NOTE — Telephone Encounter (Signed)
Received call on call tonight.  Access nurse stated that pt called c/o severe RLS.  Nurse tried to triage patient and patient refused to answer any questions stating that she just wanted her lyrica filled and increased.  Told nurse that pt needs to call during normal business hours as lyrica is a controlled substance and we don't fill those after hours.

## 2020-06-30 DIAGNOSIS — I10 Essential (primary) hypertension: Secondary | ICD-10-CM | POA: Diagnosis not present

## 2020-06-30 DIAGNOSIS — Z6822 Body mass index (BMI) 22.0-22.9, adult: Secondary | ICD-10-CM | POA: Diagnosis not present

## 2020-06-30 DIAGNOSIS — E039 Hypothyroidism, unspecified: Secondary | ICD-10-CM | POA: Diagnosis not present

## 2020-06-30 DIAGNOSIS — G4730 Sleep apnea, unspecified: Secondary | ICD-10-CM | POA: Diagnosis not present

## 2020-06-30 DIAGNOSIS — F331 Major depressive disorder, recurrent, moderate: Secondary | ICD-10-CM | POA: Diagnosis not present

## 2020-06-30 DIAGNOSIS — Z299 Encounter for prophylactic measures, unspecified: Secondary | ICD-10-CM | POA: Diagnosis not present

## 2020-06-30 NOTE — Telephone Encounter (Signed)
Patient returned call to [facility].

## 2020-06-30 NOTE — Telephone Encounter (Signed)
Spoke with patient who states she restarted the medication that was stopped at her last visit. She states she will wait for Dr Tomi Likens to return next week to see what he suggests.

## 2020-06-30 NOTE — Telephone Encounter (Signed)
See Dr. Georgie Chard last note.  She needs to continue to wean off of the pramipexole as he had planned.  She can increase the lyrica to 75 mg in the AM and 150 mg at night.  You may send a 30 day supply if she needs it

## 2020-06-30 NOTE — Telephone Encounter (Signed)
Left message for patient to contact office.

## 2020-07-02 NOTE — Telephone Encounter (Signed)
When I saw her 2 weeks ago, I didn't yet stop any medication.  She was on Lyrica 75mg  twice daily and pramipexole 0.125mg  twice daily.  When she contacted the office last week (while I was out), the plan was to have her decrease pramipexole to 0.125mg  once daily (with plan to discontinue in another month if tolerated).  If the restless leg symptoms became worse, we would increase Lyrica to 75mg  in morning and 150mg  at bedtime.

## 2020-07-04 NOTE — Telephone Encounter (Signed)
Tried calling pt, No answer. Sounds like someone will pick up but not say anything.

## 2020-07-06 ENCOUNTER — Other Ambulatory Visit: Payer: Self-pay | Admitting: Neurology

## 2020-07-06 ENCOUNTER — Telehealth: Payer: Self-pay | Admitting: Neurology

## 2020-07-06 DIAGNOSIS — I10 Essential (primary) hypertension: Secondary | ICD-10-CM | POA: Diagnosis not present

## 2020-07-06 MED ORDER — PREGABALIN 75 MG PO CAPS
ORAL_CAPSULE | ORAL | 5 refills | Status: DC
Start: 2020-07-06 — End: 2020-07-27

## 2020-07-06 NOTE — Telephone Encounter (Signed)
Patient called requesting a call back from the nurse. She said she is having trouble with her transition from mirapex to lyrica and she needs a call back.

## 2020-07-06 NOTE — Telephone Encounter (Signed)
She is to continue the indomethacin

## 2020-07-06 NOTE — Telephone Encounter (Signed)
Per last conversation with the pt, Pt labs are normal and she should continue taking the Indomethacin if she is still having headache per Dr.Aquino.  But pt wanted to get dr.Jaffe's suggestion.

## 2020-07-06 NOTE — Telephone Encounter (Signed)
Explain Dr.Jaffe and Dr.Tat note to the pt. Pt okay to increase the Lyrica to 150 at bedtime if the leg pain get worse. Please let us know how she is doing in 4 weeks.   Per pt please send in refill for the lyrica with instruction stating the increase.

## 2020-07-06 NOTE — Telephone Encounter (Signed)
Script for Lyrica 75mg  in AM and 150mg  in PM sent.

## 2020-07-06 NOTE — Telephone Encounter (Signed)
Please call pt back.

## 2020-07-07 NOTE — Telephone Encounter (Signed)
LMOVM, to continue taking the medication.

## 2020-07-10 ENCOUNTER — Other Ambulatory Visit: Payer: Self-pay | Admitting: Neurology

## 2020-07-10 ENCOUNTER — Telehealth: Payer: Self-pay | Admitting: Neurology

## 2020-07-10 NOTE — Telephone Encounter (Signed)
She can go ahead and increase Lyrica to 150mg  in morning and 150mg  at night.  The higher dose in the morning should hopefully cover her daytime symptoms.

## 2020-07-10 NOTE — Telephone Encounter (Signed)
Pt called and informed continue the pramipexole 0.125mg  once daily for now.  If doing well in 3 weeks, then we can try stopping it all together. Pt  Verbalized understanding,

## 2020-07-10 NOTE — Telephone Encounter (Signed)
Pt asking if you would like for her to stop the pramipexole?  Pt called back and was informed to increase Lyrica to 150mg  in morning and 150mg  at night.  The higher dose in the morning should hopefully cover her daytime symptoms

## 2020-07-10 NOTE — Telephone Encounter (Signed)
No, continue the pramipexole 0.125mg  once daily for now.  If doing well in 3 weeks, then we can try stopping it all together.

## 2020-07-10 NOTE — Telephone Encounter (Signed)
Pt call no answer voice mail left to call the office back

## 2020-07-10 NOTE — Telephone Encounter (Signed)
Patient is wanting to speak with someone about her RLS. She states that they are getting bad in the Afternoon and evening hours. She was not sure if we needed to adjust the medication again. Please call

## 2020-07-11 ENCOUNTER — Other Ambulatory Visit: Payer: PPO

## 2020-07-12 ENCOUNTER — Telehealth: Payer: Self-pay | Admitting: Neurology

## 2020-07-12 NOTE — Telephone Encounter (Signed)
Patient called in returning [facility] call.

## 2020-07-12 NOTE — Telephone Encounter (Signed)
Tried calling pt no answer. LMOVM to call the office back °

## 2020-07-14 MED ORDER — INDOMETHACIN 50 MG PO CAPS
50.0000 mg | ORAL_CAPSULE | Freq: Three times a day (TID) | ORAL | 0 refills | Status: DC
Start: 2020-07-14 — End: 2020-12-06

## 2020-07-14 NOTE — Telephone Encounter (Signed)
Increase indomethacin to 50mg  three times daily (you can send a new prescription for 50mg  tablet)

## 2020-07-14 NOTE — Telephone Encounter (Signed)
Per pt her phone wasn't acting right. She got a new phone now.   Per pt the pan her head hasn't let up. The indomethacin not helping. Pt wanted to know if she could go on to the muscle relaxer that Dr.Jaffe mentioned at her last visit.

## 2020-07-14 NOTE — Telephone Encounter (Signed)
Pt advised of increase on indomethacin  50 mg TID. And to give [place] a call in 4 weeks with update.

## 2020-07-19 ENCOUNTER — Telehealth: Payer: Self-pay | Admitting: Neurology

## 2020-07-19 NOTE — Telephone Encounter (Signed)
Patient wants to speak the nurse about her medication she had to stop taking it she did not give me the name of the medication

## 2020-07-19 NOTE — Telephone Encounter (Signed)
Pt c/o feeling drunk, Dizzy, feeling like a Zombie and she do not remember anything she did Saturday.  SO the pt states she had to stop taking Idomethacin 50 mg, The lower dose doesn't work at all.   What else can she do please advise.

## 2020-07-20 MED ORDER — BACLOFEN 5 MG PO TABS: ORAL_TABLET | ORAL | 3 refills | Status: DC

## 2020-07-20 NOTE — Telephone Encounter (Signed)
Pt advised of note medication sent to the pharmacy.

## 2020-07-20 NOTE — Telephone Encounter (Signed)
We can start baclofen for the headache - baclofen 5mg  tablet:  take 5mg  at bedtime for one week, then 5mg  twice daily for one week, then 5mg  three times daily.  There is room to increase dose from there if needed.  She would need to caution for drowsiness like every other potential medication.

## 2020-07-25 ENCOUNTER — Telehealth: Payer: Self-pay | Admitting: Neurology

## 2020-07-25 NOTE — Telephone Encounter (Signed)
Patient wants to speak to [facility]  She states that she left a VM for sheena

## 2020-07-26 ENCOUNTER — Other Ambulatory Visit: Payer: Self-pay | Admitting: Neurology

## 2020-07-26 MED ORDER — ONDANSETRON 4 MG PO TBDP: 4.0000 mg | ORAL_TABLET | Freq: Three times a day (TID) | ORAL | 5 refills | Status: DC | PRN

## 2020-07-26 NOTE — Telephone Encounter (Signed)
Zofran sent to [facility] in [place]

## 2020-07-26 NOTE — Telephone Encounter (Signed)
She can restart Mirapex 0.125mg  at night (1-2 hours before bedtime)

## 2020-07-26 NOTE — Telephone Encounter (Signed)
LMOVM for pt to give [place] a call pt.

## 2020-07-26 NOTE — Telephone Encounter (Signed)
Patient returned call to Sheena. 

## 2020-07-26 NOTE — Telephone Encounter (Signed)
Patient called back and stated she also needs to speak with [facility] about her Lyrica.

## 2020-07-26 NOTE — Telephone Encounter (Signed)
Pt advised to restart Mirapex .125 mg.   Pt wanted to know if Dr.Jaffe will still send the Zofran.      Please advise

## 2020-07-26 NOTE — Telephone Encounter (Signed)
Pr pt the Lyrica doing well but she get a little nauseous. Can Dr.Jaffe send in Zofran the dissolvable.  Per pt she is completely off of the Mirapex. And she doing the increase dose of the Lyrica 150 mg in the Am, and 150 mg bedtime. And now she up at night.  If dr.Jaffe changes the script the pharmacy per pt the pharmacy says we will need to send in a new script for what ever it will be even if  We keep it as 150 in am and 150 in pm.   Pt wanted to know if she needs to go up on the Lyrica or whatever Dr.Jaffe suggest.

## 2020-07-27 ENCOUNTER — Other Ambulatory Visit: Payer: Self-pay | Admitting: Neurology

## 2020-07-27 MED ORDER — PREGABALIN 150 MG PO CAPS: 150.0000 mg | ORAL_CAPSULE | Freq: Two times a day (BID) | ORAL | 5 refills | Status: DC

## 2020-07-27 NOTE — Telephone Encounter (Signed)
Patient called in stating she got the Zofran, but they didn't get the updated prescription for the 75 mg of Lyrica. She says thank you for getting the Zofran because she is not nauseous after taking it.

## 2020-07-27 NOTE — Telephone Encounter (Signed)
Lyrica 150mg  BID sent to pharmacy

## 2020-07-28 DIAGNOSIS — H40033 Anatomical narrow angle, bilateral: Secondary | ICD-10-CM | POA: Diagnosis not present

## 2020-07-28 DIAGNOSIS — H04123 Dry eye syndrome of bilateral lacrimal glands: Secondary | ICD-10-CM | POA: Diagnosis not present

## 2020-08-04 DIAGNOSIS — I10 Essential (primary) hypertension: Secondary | ICD-10-CM | POA: Diagnosis not present

## 2020-08-10 DIAGNOSIS — M25552 Pain in left hip: Secondary | ICD-10-CM | POA: Diagnosis not present

## 2020-08-10 DIAGNOSIS — E039 Hypothyroidism, unspecified: Secondary | ICD-10-CM | POA: Diagnosis not present

## 2020-08-10 DIAGNOSIS — Z299 Encounter for prophylactic measures, unspecified: Secondary | ICD-10-CM | POA: Diagnosis not present

## 2020-08-10 DIAGNOSIS — I10 Essential (primary) hypertension: Secondary | ICD-10-CM | POA: Diagnosis not present

## 2020-08-10 DIAGNOSIS — F331 Major depressive disorder, recurrent, moderate: Secondary | ICD-10-CM | POA: Diagnosis not present

## 2020-08-10 DIAGNOSIS — G4730 Sleep apnea, unspecified: Secondary | ICD-10-CM | POA: Diagnosis not present

## 2020-08-17 DIAGNOSIS — R319 Hematuria, unspecified: Secondary | ICD-10-CM | POA: Diagnosis not present

## 2020-08-31 DIAGNOSIS — I10 Essential (primary) hypertension: Secondary | ICD-10-CM | POA: Diagnosis not present

## 2020-08-31 DIAGNOSIS — R42 Dizziness and giddiness: Secondary | ICD-10-CM | POA: Diagnosis not present

## 2020-08-31 DIAGNOSIS — Z299 Encounter for prophylactic measures, unspecified: Secondary | ICD-10-CM | POA: Diagnosis not present

## 2020-08-31 DIAGNOSIS — J42 Unspecified chronic bronchitis: Secondary | ICD-10-CM | POA: Diagnosis not present

## 2020-08-31 DIAGNOSIS — D692 Other nonthrombocytopenic purpura: Secondary | ICD-10-CM | POA: Diagnosis not present

## 2020-09-05 DIAGNOSIS — I10 Essential (primary) hypertension: Secondary | ICD-10-CM | POA: Diagnosis not present

## 2020-09-14 DIAGNOSIS — Z8742 Personal history of other diseases of the female genital tract: Secondary | ICD-10-CM | POA: Diagnosis not present

## 2020-09-14 DIAGNOSIS — N898 Other specified noninflammatory disorders of vagina: Secondary | ICD-10-CM | POA: Diagnosis not present

## 2020-09-14 DIAGNOSIS — N958 Other specified menopausal and perimenopausal disorders: Secondary | ICD-10-CM | POA: Diagnosis not present

## 2020-09-19 DIAGNOSIS — I10 Essential (primary) hypertension: Secondary | ICD-10-CM | POA: Diagnosis not present

## 2020-09-19 DIAGNOSIS — Z299 Encounter for prophylactic measures, unspecified: Secondary | ICD-10-CM | POA: Diagnosis not present

## 2020-10-04 DIAGNOSIS — M7062 Trochanteric bursitis, left hip: Secondary | ICD-10-CM | POA: Diagnosis not present

## 2020-10-04 DIAGNOSIS — M25552 Pain in left hip: Secondary | ICD-10-CM | POA: Diagnosis not present

## 2020-10-05 DIAGNOSIS — I10 Essential (primary) hypertension: Secondary | ICD-10-CM | POA: Diagnosis not present

## 2020-10-11 DIAGNOSIS — M7062 Trochanteric bursitis, left hip: Secondary | ICD-10-CM | POA: Diagnosis not present

## 2020-10-31 ENCOUNTER — Other Ambulatory Visit: Payer: Self-pay

## 2020-10-31 DIAGNOSIS — R519 Headache, unspecified: Secondary | ICD-10-CM

## 2020-10-31 NOTE — Progress Notes (Signed)
Telephone call to [facility] to check the staus of PA from 06/2020.  No Pa received. Have to add new order and send PA notes over to [phone]

## 2020-11-06 NOTE — Progress Notes (Signed)
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 Headache - probable trigeminal nerve: MRI of brain with and without contrast was ordered.  However she was told that since she didn't pay a $200 copay for prior MRI, they would not approve it (the [place] was supposed to cover the copay).  So it was never performed. Started indomethacin in March.  Titrated up to '50mg'$  three times daily but self-discontinued it because it made her feel "drunk" and dizzy.  Instead she was started on baclofen and it went away.  Once in awhile, she would feel a twinge.  Now takes as needed.   2.  Restless Leg Syndrome: Due to augmentation, she was undergoing a slow taper of pramipexole with addition of Lyrica.  Once she weas down to 0.'125mg'$  of pramipexole daily, symptoms got worse.  Due to worsening symptoms, Lyrica was titrated up to '150mg'$  twice daily.  She was doing well during the day but up at night.  Restarted pramipexole 0.'125mg'$  in the eventing.  Lyrica makes her nauseous, so she was prescribed Zofran.  She is finally off of pramipexole and doing well on Lyrica alone.  3.  She occasionally notes tingling involving the right side of her face.  Once it involved the entire body.  She thinks it may be  stress-related.  She is having to handle her mother's medical care.   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  She was started on Lithium in March 2020 and developed various symptoms in June, such as memory deficits/word-finding difficulty, profuse sweating, left arm pain and GI issues.  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  She stopped Lithium.  However, she continues to have profuse sweating, tingling itch across the face and hands (like a feather running across her skin.      She still has tingling and aching pain radiating down the left arm from shoulder down to the entire hand. No weakness.  No neck pain.  She does have history of cervical  C5-C6-C7 spine fusion 10 years ago.  01/28/2019 NCV-EMG:  normal.  02/19/2019 MRI Cervical Spine Wo:  ACDF C5-6 and C6-7 without stenosis; 2 mm anterolisthesis with central disc protrusion, uncinate spurring and moderate facet degeneration at C4-5 causing mild spinal stenosis and moderate foraminal stenosis bilaterally.  She started seeing a chiropractor which has been helpful.  If she turns her head to the left, she feels the pain and numbness down the left shoulder and to the fingertips.     She has restless leg syndrome which lasts all day.  12/30/2018 LABS:  Ferritin 65, TSH 1.00, B12 >2,000.   She continued to have short-term memory deficits.  12/30/2018 LABS:  TSH 1.00, B12 >2,000.  She underwent  neuropsychological testing with Dr. Melvyn Novas on 03/25/2019, which did not demonstrate evidence of neurocognitive deficits.  Symptoms were thought to be possibly related to severe depression and anxiety and pharmacologic effect.    In 2021, she developed a twitch in her left eye.  She later developed a paroxysmal stabbing pain starting on top left parietal region radiating down to left eye.  It would last a couple of seconds.  It  occurs several times a day (6 to 20 times a day).  No associated facial numbness, facial weakness or autonomic symptoms involving the eye.  No triggers.     Past medications:  lithium; gabapentin (muscle jerks), Lyrica (sleepiness, sleep walking); ropinrole (lost efficacy), Cymbalta, melatonin (she feels "wired"), indomethacin (side effects, ineffective), pramipexole (discontinued due to augmentation)  PAST MEDICAL HISTORY: Past Medical History:  Diagnosis Date   Acute pericarditis 12/2013   Bipolar II disorder    Possible diagnosis   Complication of anesthesia    Effects lasted for 3 days   Dyslipidemia    Generalized anxiety disorder with panic attacks    Hx of lithium toxicity    Hx of migraines    On propranolol.   Hypothyroid    Major depressive disorder 04/16/2013   Obstructive sleep apnea    Currently untreatred; oral device caused TMJ; unsuccessful with CPAP   Pericardial effusion 01/03/2014   Moderate without hemodynamic compromise.   Sinus tachycardia     MEDICATIONS: Current Outpatient Medications on File Prior to Visit  Medication Sig Dispense Refill   ALPRAZolam (XANAX) 0.25 MG tablet Take 0.25 mg by mouth 4 (four) times daily as needed for anxiety.      Baclofen 5 MG TABS take '5mg'$  at bedtime for one week, then '5mg'$  twice daily for one week, then '5mg'$  three times daily. 90 tablet 3   betamethasone dipropionate 0.05 % cream Apply topically 2 (two) times daily. 30 g 2   citalopram (CELEXA) 40 MG tablet Take 40 mg by mouth daily.      diclofenac sodium (VOLTAREN) 1 % GEL Apply 2 g topically 3 (three) times daily as needed (PRN for arthritis pain in hands, knees and shoulder-uses about 1 tube/month).     dicyclomine (BENTYL) 10 MG capsule Take 1 capsule (10 mg total) by mouth 3 (three) times daily before meals. 90 capsule 4   EPIPEN 2-PAK 0.3 MG/0.3ML SOAJ injection Inject 0.3 mg into the muscle once.   1   Eyelid Cleansers (AVENOVA) 0.01 % SOLN Apply 1 application topically  2 (two) times daily.      indomethacin (INDOCIN) 25 MG capsule Take 1 capsule at bedtime for one week, then 1 capsule twice daily for one week, then 1 capsule three times daily 90 capsule 0   indomethacin (INDOCIN) 50 MG capsule Take 1 capsule (50 mg total) by mouth 3 (three) times daily with meals. 90 capsule 0   lamoTRIgine (LAMICTAL) 25 MG tablet Take by mouth at bedtime. '75mg'$  bid     levothyroxine (SYNTHROID, LEVOTHROID) 75 MCG tablet Take 75 mcg by mouth daily before breakfast.     Lifitegrast 5 % SOLN Apply 1 drop to eye 2 (two) times daily.     meloxicam (MOBIC) 15 MG tablet Take 15 mg by mouth daily.     olopatadine (PATANOL) 0.1 % ophthalmic solution Place 1 drop into both eyes 2 (two) times daily.      omeprazole (PRILOSEC) 20 MG capsule Take 20 mg by mouth daily.     ondansetron [facility]  ODT) 4 MG disintegrating tablet Take 1 tablet (4 mg total) by mouth every 8 (eight) hours as needed for nausea or vomiting. 20 tablet 5   pramipexole (MIRAPEX) 0.125 MG tablet TAKE ONE TABLET BY MOUTH IN THE [place], NOON, IN THE EVENING AND AT BEDTIME 120 tablet 3   pregabalin (LYRICA) 150 MG capsule Take 1 capsule (150 mg total) by mouth 2 (two) times daily. 60 capsule 5   propranolol (INDERAL) 60 MG tablet Take 60 mg by mouth daily.     Propylene Glycol (SYSTANE COMPLETE OP) Apply 1 drop to eye 4 (four) times daily. Also uses prn     rizatriptan (MAXALT) 10 MG tablet Take 10 mg by mouth as needed for migraine. May repeat in 2 hours if needed     valsartan (DIOVAN) 80 MG tablet Take 80 mg by mouth daily.     No current facility-administered medications on file prior to visit.    ALLERGIES: Allergies  Allergen Reactions   Other Anaphylaxis, Swelling and Other (See Comments)    Walnuts   Sulfa Antibiotics Shortness Of Breath and Other (See Comments)    Swelling of lips    Sunscreens Anaphylaxis, Hives and Other (See Comments)    Titanium and zinc oxides Eye swelling    Seroquel [Quetiapine]  Other (See Comments)    Pychosis (foggy, uncoordinated, slurred speech)   Tape Hives    FAMILY HISTORY: Family History  Problem Relation Age of Onset   Depression Mother    Depression Father    Depression Sister    Anxiety disorder Sister    Alcohol abuse Maternal Uncle    Alcohol abuse Paternal Uncle    Alcohol abuse Maternal Grandfather    Depression Maternal Grandmother       Objective:  Blood pressure 119/71, pulse 69, height '5\' 7"'$  (1.702 m), weight 135 lb 9.6 oz (61.5 kg), SpO2 97 %. General: No acute distress.  Patient appears well-groomed.   Head:  Normocephalic/atraumatic Eyes:  Fundi examined but not visualized Neck: supple, no paraspinal tenderness, full range of motion Heart:  Regular rate and rhythm Lungs:  Clear to auscultation bilaterally Back: No paraspinal tenderness Neurological Exam: alert and oriented to person, place, and time.  Speech fluent and not dysarthric, language intact.  CN II-XII intact. Bulk and tone normal, muscle strength 5/5 throughout.  Sensation to light touch intact.  Deep tendon reflexes 2+ throughout, toes downgoing.  Finger to nose testing intact.  Gait normal, Romberg negative.   Metta Clines, DO  CC: Jerene Bears, MD

## 2020-11-07 ENCOUNTER — Encounter: Payer: Self-pay | Admitting: Neurology

## 2020-11-07 ENCOUNTER — Other Ambulatory Visit: Payer: Self-pay

## 2020-11-07 ENCOUNTER — Ambulatory Visit (INDEPENDENT_AMBULATORY_CARE_PROVIDER_SITE_OTHER): Payer: No Typology Code available for payment source | Admitting: Neurology

## 2020-11-07 ENCOUNTER — Other Ambulatory Visit (INDEPENDENT_AMBULATORY_CARE_PROVIDER_SITE_OTHER): Payer: No Typology Code available for payment source

## 2020-11-07 VITALS — BP 119/71 | HR 69 | Ht 67.0 in | Wt 135.6 lb

## 2020-11-07 DIAGNOSIS — G2581 Restless legs syndrome: Secondary | ICD-10-CM

## 2020-11-07 DIAGNOSIS — R202 Paresthesia of skin: Secondary | ICD-10-CM | POA: Diagnosis not present

## 2020-11-07 DIAGNOSIS — E538 Deficiency of other specified B group vitamins: Secondary | ICD-10-CM

## 2020-11-07 DIAGNOSIS — G500 Trigeminal neuralgia: Secondary | ICD-10-CM

## 2020-11-07 LAB — VITAMIN B12: Vitamin B-12: 852 pg/mL (ref 211–911)

## 2020-11-07 MED ORDER — BACLOFEN 5 MG PO TABS: 5.0000 mg | ORAL_TABLET | Freq: Three times a day (TID) | ORAL | 5 refills | Status: DC

## 2020-11-07 NOTE — Patient Instructions (Signed)
No change in medication Lyrica Refilled baclofen

## 2020-11-09 NOTE — Progress Notes (Signed)
Pt advised of her B12 level results.

## 2020-11-25 ENCOUNTER — Ambulatory Visit
Admission: RE | Admit: 2020-11-25 | Discharge: 2020-11-25 | Disposition: A | Discharge status: Home / Self Care | Payer: PPO | Source: Ambulatory Visit | Attending: Neurology | Admitting: Neurology

## 2020-11-25 DIAGNOSIS — R519 Headache, unspecified: Secondary | ICD-10-CM

## 2020-11-25 DIAGNOSIS — I671 Cerebral aneurysm, nonruptured: Secondary | ICD-10-CM | POA: Diagnosis not present

## 2020-11-25 MED ORDER — GADOBENATE DIMEGLUMINE 529 MG/ML IV SOLN
12.0000 mL | Freq: Once | INTRAVENOUS | Status: AC | PRN
  Administered 2020-11-25: 12 mL via INTRAVENOUS

## 2020-11-27 ENCOUNTER — Telehealth: Payer: Self-pay | Admitting: Neurology

## 2020-11-27 DIAGNOSIS — I671 Cerebral aneurysm, nonruptured: Secondary | ICD-10-CM

## 2020-11-27 NOTE — Addendum Note (Signed)
Addended by: Venetia Night on: 11/27/2020 04:20 PM   Modules accepted: Orders

## 2020-11-27 NOTE — Telephone Encounter (Signed)
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  I would like to refer to [facility] of interventional radiology for evaluation of cerebral aneurysm.  She also reports experiencing some less intense right sided facial pain.  Advised to try taking the baclofen three times daily around the clock (not just as needed).  She will try at home to see if it makes her too drowsy.

## 2020-11-27 NOTE — Telephone Encounter (Signed)
Order added for [employer].

## 2020-11-28 ENCOUNTER — Other Ambulatory Visit (HOSPITAL_COMMUNITY): Payer: Self-pay | Admitting: Interventional Radiology

## 2020-11-28 DIAGNOSIS — I671 Cerebral aneurysm, nonruptured: Secondary | ICD-10-CM

## 2020-11-29 ENCOUNTER — Other Ambulatory Visit: Payer: Self-pay

## 2020-11-29 ENCOUNTER — Ambulatory Visit (HOSPITAL_COMMUNITY)
Admission: RE | Admit: 2020-11-29 | Discharge: 2020-11-29 | Disposition: A | Discharge status: Home / Self Care | Payer: PPO | Source: Ambulatory Visit | Attending: Interventional Radiology | Admitting: Interventional Radiology

## 2020-11-29 DIAGNOSIS — I671 Cerebral aneurysm, nonruptured: Secondary | ICD-10-CM

## 2020-11-30 HISTORY — PX: IR RADIOLOGIST EVAL & MGMT: IMG5224

## 2020-12-01 ENCOUNTER — Telehealth (HOSPITAL_COMMUNITY): Payer: Self-pay | Admitting: Radiology

## 2020-12-01 ENCOUNTER — Other Ambulatory Visit (HOSPITAL_COMMUNITY): Payer: Self-pay | Admitting: Interventional Radiology

## 2020-12-01 DIAGNOSIS — I671 Cerebral aneurysm, nonruptured: Secondary | ICD-10-CM

## 2020-12-01 NOTE — Telephone Encounter (Signed)
Spoke to pt to arrange for brain aneurysm with Dr. Estanislado Pandy. Pt will be scheduled on 12/13/20. Plavix '75mg'$  1 QD was called in to [facility] with 3 refills. Patient will start Aspirin '81mg'$  1 QD as well as Plavix '75mg'$  1 QD. Pt will come to [facility] radiology on 12/05/20 between 8 am and 2 pm for a P2Y12 blood test for Deveshwar. Pt agrees to this plan of care. JM

## 2020-12-05 ENCOUNTER — Other Ambulatory Visit (HOSPITAL_COMMUNITY)
Admission: RE | Admit: 2020-12-05 | Discharge: 2020-12-05 | Disposition: A | Discharge status: Home / Self Care | Payer: No Typology Code available for payment source | Source: Ambulatory Visit | Attending: Interventional Radiology | Admitting: Interventional Radiology

## 2020-12-05 ENCOUNTER — Other Ambulatory Visit: Payer: Self-pay | Admitting: Physician Assistant

## 2020-12-05 ENCOUNTER — Other Ambulatory Visit (HOSPITAL_COMMUNITY): Payer: Self-pay | Admitting: Radiology

## 2020-12-05 DIAGNOSIS — I671 Cerebral aneurysm, nonruptured: Secondary | ICD-10-CM

## 2020-12-07 ENCOUNTER — Telehealth: Payer: Self-pay

## 2020-12-07 LAB — PLATELET INHIBITION P2Y12

## 2020-12-07 NOTE — Telephone Encounter (Signed)
Message left by Chrissy at the [facility] [phone] ext. [phone].  Pt needs a new RFF Form if she continues to [place] the [place] insurance to be seen at our office as well as the MRI pt had and the referral tot the Neuro Surgeon.    Pt unable to talk mother in hospital.  Please just do whatever Chrissy at the [place] wants.    Last note 10/31/20 PA faxed over to [place].   RFF forms faxed to [facility] at [phone].

## 2020-12-12 ENCOUNTER — Other Ambulatory Visit: Payer: Self-pay | Admitting: Radiology

## 2020-12-12 ENCOUNTER — Encounter (HOSPITAL_COMMUNITY): Payer: Self-pay | Admitting: Interventional Radiology

## 2020-12-12 ENCOUNTER — Other Ambulatory Visit: Payer: Self-pay

## 2020-12-12 NOTE — Progress Notes (Signed)
PCP - Dr. Woody Seller Cardiologist - Dr. Harl Bowie Neurologist - Dr. Tomi Likens EKG - DOS Chest x-ray -  ECHO - 03/27/14 Cardiac Cath -  CPAP -   Blood Thinner Instructions: per pt, per MD to continue ASA and plavix DOS Aspirin Instructions:   ERAS Protcol -   COVID TEST- DOS  Anesthesia review: n/a  -------------  SDW INSTRUCTIONS:  Your procedure is scheduled on 9/7. Please report to [facility] Main Entrance "A" at [email or web address]., and check in at the Admitting office. Call this number if you have problems the morning of surgery: [phone]   Remember: Do not eat or drink after midnight the night before your surgery   Medications to take morning of surgery with a sip of water include: aspirin EC clopidogrel (PLAVIX)  DULoxetine (CYMBALTA) levothyroxine (SYNTHROID, LEVOTHROID)  omeprazole (PRILOSEC) pregabalin (LYRICA) propranolol (INDERAL)    If needed: ALPRAZolam (XANAX) ondansetron (ZOFRAN ODT)   As of today, STOP taking any Aleve, Naproxen, Ibuprofen, Motrin, Advil, Goody's, BC's, all herbal medications, fish oil, and all vitamins.    The Morning of Surgery Do not wear jewelry, make-up or nail polish. Do not wear lotions, powders, or perfumes or deodorant Do not shave 48 hours prior to surgery.   Men may shave face and neck. Do not bring valuables to the hospital. [facility] is not responsible for any belongings or valuables.  If you are a smoker, DO NOT Smoke 24 hours prior to surgery  If you wear a CPAP at night please bring your mask the morning of surgery   Remember that you must have someone to transport you home after your surgery, and remain with you for 24 hours if you are discharged the same day.  Please bring cases for contacts, glasses, hearing aids, dentures or bridgework because it cannot be worn into surgery.   Patients discharged the day of surgery will not be allowed to drive home.   Please shower the NIGHT BEFORE/MORNING OF SURGERY (use antibacterial  soap like DIAL soap if possible). Wear comfortable clothes the morning of surgery. Oral Hygiene is also important to reduce your risk of infection.  Remember - BRUSH YOUR TEETH THE MORNING OF SURGERY WITH YOUR REGULAR TOOTHPASTE  Patient denies shortness of breath, fever, cough and chest pain.

## 2020-12-12 NOTE — Anesthesia Preprocedure Evaluation (Addendum)
Anesthesia Evaluation  Patient identified by MRN, date of birth, ID band Patient awake    Reviewed: Allergy & Precautions, NPO status , Patient's Chart, lab work & pertinent test results  History of Anesthesia Complications (+) history of anesthetic complications  Airway Mallampati: II  TM Distance: >3 FB Neck ROM: Full    Dental  (+) Dental Advisory Given, Teeth Intact, Caps   Pulmonary sleep apnea and Continuous Positive Airway Pressure Ventilation , former smoker,    Pulmonary exam normal breath sounds clear to auscultation       Cardiovascular Exercise Tolerance: Good hypertension, Pt. on home beta blockers and Pt. on medications Normal cardiovascular exam Rhythm:Regular Rate:Normal     Neuro/Psych PSYCHIATRIC DISORDERS Anxiety Depression Bipolar Disorder negative neurological ROS     GI/Hepatic Neg liver ROS, GERD  Medicated and Controlled,  Endo/Other  Hypothyroidism   Renal/GU negative Renal ROS     Musculoskeletal negative musculoskeletal ROS (+)   Abdominal   Peds  Hematology negative hematology ROS (+)   Anesthesia Other Findings   Reproductive/Obstetrics negative OB ROS                            Anesthesia Physical  Anesthesia Plan  ASA: 3  Anesthesia Plan: General   Post-op Pain Management:    Induction: Intravenous  PONV Risk Score and Plan: 3 and Ondansetron, Dexamethasone, Treatment may vary due to age or medical condition and Midazolam  Airway Management Planned: Oral ETT  Additional Equipment: Arterial line  Intra-op Plan:   Post-operative Plan: Possible Post-op intubation/ventilation  Informed Consent: I have reviewed the patients History and Physical, chart, labs and discussed the procedure including the risks, benefits and alternatives for the proposed anesthesia with the patient or authorized representative who has indicated his/her understanding and  acceptance.     Dental advisory given  Plan Discussed with: CRNA  Anesthesia Plan Comments:        Anesthesia Quick Evaluation

## 2020-12-13 ENCOUNTER — Inpatient Hospital Stay (HOSPITAL_COMMUNITY)
Admission: RE | Admit: 2020-12-13 | Discharge: 2020-12-13 | Disposition: A | Discharge status: Home / Self Care | Payer: No Typology Code available for payment source | Source: Ambulatory Visit | Attending: Interventional Radiology | Admitting: Interventional Radiology

## 2020-12-13 ENCOUNTER — Inpatient Hospital Stay (HOSPITAL_COMMUNITY): Payer: No Typology Code available for payment source

## 2020-12-13 ENCOUNTER — Encounter (HOSPITAL_COMMUNITY): Payer: Self-pay | Admitting: Interventional Radiology

## 2020-12-13 ENCOUNTER — Ambulatory Visit (HOSPITAL_COMMUNITY): Payer: No Typology Code available for payment source | Admitting: Anesthesiology

## 2020-12-13 ENCOUNTER — Other Ambulatory Visit: Payer: Self-pay

## 2020-12-13 ENCOUNTER — Encounter (HOSPITAL_COMMUNITY)
Admission: AD | Disposition: A | Discharge status: Rehabilitation Facility | Payer: Self-pay | Source: Home / Self Care | Attending: Interventional Radiology

## 2020-12-13 ENCOUNTER — Inpatient Hospital Stay (HOSPITAL_COMMUNITY)
Admission: AD | Admit: 2020-12-13 | Discharge: 2020-12-22 | DRG: 025 | Disposition: A | Discharge status: Rehabilitation Facility | Payer: No Typology Code available for payment source | Attending: Interventional Radiology | Admitting: Interventional Radiology

## 2020-12-13 DIAGNOSIS — Z87891 Personal history of nicotine dependence: Secondary | ICD-10-CM | POA: Diagnosis not present

## 2020-12-13 DIAGNOSIS — Z20822 Contact with and (suspected) exposure to covid-19: Secondary | ICD-10-CM | POA: Diagnosis present

## 2020-12-13 DIAGNOSIS — F3181 Bipolar II disorder: Secondary | ICD-10-CM | POA: Diagnosis not present

## 2020-12-13 DIAGNOSIS — I6389 Other cerebral infarction: Secondary | ICD-10-CM | POA: Diagnosis not present

## 2020-12-13 DIAGNOSIS — Z7982 Long term (current) use of aspirin: Secondary | ICD-10-CM

## 2020-12-13 DIAGNOSIS — I724 Aneurysm of artery of lower extremity: Secondary | ICD-10-CM | POA: Diagnosis not present

## 2020-12-13 DIAGNOSIS — Z888 Allergy status to other drugs, medicaments and biological substances status: Secondary | ICD-10-CM

## 2020-12-13 DIAGNOSIS — I63512 Cerebral infarction due to unspecified occlusion or stenosis of left middle cerebral artery: Secondary | ICD-10-CM | POA: Diagnosis not present

## 2020-12-13 DIAGNOSIS — I10 Essential (primary) hypertension: Secondary | ICD-10-CM | POA: Diagnosis present

## 2020-12-13 DIAGNOSIS — Z818 Family history of other mental and behavioral disorders: Secondary | ICD-10-CM | POA: Diagnosis not present

## 2020-12-13 DIAGNOSIS — I63412 Cerebral infarction due to embolism of left middle cerebral artery: Secondary | ICD-10-CM | POA: Diagnosis not present

## 2020-12-13 DIAGNOSIS — Z981 Arthrodesis status: Secondary | ICD-10-CM

## 2020-12-13 DIAGNOSIS — R2689 Other abnormalities of gait and mobility: Secondary | ICD-10-CM | POA: Diagnosis not present

## 2020-12-13 DIAGNOSIS — F331 Major depressive disorder, recurrent, moderate: Secondary | ICD-10-CM | POA: Diagnosis not present

## 2020-12-13 DIAGNOSIS — R29701 NIHSS score 1: Secondary | ICD-10-CM | POA: Diagnosis not present

## 2020-12-13 DIAGNOSIS — I671 Cerebral aneurysm, nonruptured: Principal | ICD-10-CM | POA: Diagnosis present

## 2020-12-13 DIAGNOSIS — I97638 Postprocedural hematoma of a circulatory system organ or structure following other circulatory system procedure: Secondary | ICD-10-CM | POA: Diagnosis not present

## 2020-12-13 DIAGNOSIS — I639 Cerebral infarction, unspecified: Secondary | ICD-10-CM | POA: Diagnosis not present

## 2020-12-13 DIAGNOSIS — E785 Hyperlipidemia, unspecified: Secondary | ICD-10-CM | POA: Diagnosis present

## 2020-12-13 DIAGNOSIS — I97821 Postprocedural cerebrovascular infarction during other surgery: Secondary | ICD-10-CM | POA: Diagnosis not present

## 2020-12-13 DIAGNOSIS — R260 Ataxic gait: Secondary | ICD-10-CM | POA: Diagnosis not present

## 2020-12-13 DIAGNOSIS — Y838 Other surgical procedures as the cause of abnormal reaction of the patient, or of later complication, without mention of misadventure at the time of the procedure: Secondary | ICD-10-CM | POA: Diagnosis not present

## 2020-12-13 DIAGNOSIS — Z79899 Other long term (current) drug therapy: Secondary | ICD-10-CM

## 2020-12-13 DIAGNOSIS — Z7902 Long term (current) use of antithrombotics/antiplatelets: Secondary | ICD-10-CM | POA: Diagnosis not present

## 2020-12-13 DIAGNOSIS — H538 Other visual disturbances: Secondary | ICD-10-CM | POA: Diagnosis not present

## 2020-12-13 DIAGNOSIS — Z882 Allergy status to sulfonamides status: Secondary | ICD-10-CM | POA: Diagnosis not present

## 2020-12-13 DIAGNOSIS — Z79890 Hormone replacement therapy (postmenopausal): Secondary | ICD-10-CM

## 2020-12-13 DIAGNOSIS — K219 Gastro-esophageal reflux disease without esophagitis: Secondary | ICD-10-CM | POA: Diagnosis not present

## 2020-12-13 DIAGNOSIS — Z9889 Other specified postprocedural states: Secondary | ICD-10-CM | POA: Diagnosis not present

## 2020-12-13 DIAGNOSIS — I729 Aneurysm of unspecified site: Secondary | ICD-10-CM | POA: Diagnosis not present

## 2020-12-13 DIAGNOSIS — R4701 Aphasia: Secondary | ICD-10-CM | POA: Diagnosis not present

## 2020-12-13 DIAGNOSIS — F329 Major depressive disorder, single episode, unspecified: Secondary | ICD-10-CM | POA: Diagnosis not present

## 2020-12-13 DIAGNOSIS — I6340 Cerebral infarction due to embolism of unspecified cerebral artery: Secondary | ICD-10-CM | POA: Insufficient documentation

## 2020-12-13 DIAGNOSIS — E039 Hypothyroidism, unspecified: Secondary | ICD-10-CM | POA: Diagnosis not present

## 2020-12-13 DIAGNOSIS — R58 Hemorrhage, not elsewhere classified: Secondary | ICD-10-CM | POA: Diagnosis not present

## 2020-12-13 HISTORY — DX: Essential (primary) hypertension: I10

## 2020-12-13 HISTORY — PX: IR ANGIO INTRA EXTRACRAN SEL INTERNAL CAROTID BILAT MOD SED: IMG5363

## 2020-12-13 HISTORY — PX: IR 3D INDEPENDENT WKST: IMG2385

## 2020-12-13 HISTORY — PX: RADIOLOGY WITH ANESTHESIA: SHX6223

## 2020-12-13 HISTORY — PX: IR TRANSCATH/EMBOLIZ: IMG695

## 2020-12-13 HISTORY — PX: IR ANGIO VERTEBRAL SEL VERTEBRAL UNI L MOD SED: IMG5367

## 2020-12-13 HISTORY — PX: IR CT HEAD LTD: IMG2386

## 2020-12-13 LAB — HEPARIN LEVEL (UNFRACTIONATED): Heparin Unfractionated: 0.72 IU/mL — ABNORMAL HIGH (ref 0.30–0.70)

## 2020-12-13 LAB — BASIC METABOLIC PANEL
Anion gap: 7 (ref 5–15)
BUN: 18 mg/dL (ref 8–23)
CO2: 27 mmol/L (ref 22–32)
Calcium: 8.9 mg/dL (ref 8.9–10.3)
Chloride: 105 mmol/L (ref 98–111)
Creatinine, Ser: 0.64 mg/dL (ref 0.44–1.00)
GFR, Estimated: >60 mL/min (ref >60)
Glucose, Bld: 98 mg/dL (ref 70–99)
Potassium: 3.6 mmol/L (ref 3.5–5.1)
Sodium: 139 mmol/L (ref 135–145)

## 2020-12-13 LAB — GLUCOSE, CAPILLARY
Glucose-Capillary: 106 mg/dL — ABNORMAL HIGH (ref 70–99)
Glucose-Capillary: 127 mg/dL — ABNORMAL HIGH (ref 70–99)
Glucose-Capillary: 136 mg/dL — ABNORMAL HIGH (ref 70–99)

## 2020-12-13 LAB — CBC WITH DIFFERENTIAL/PLATELET
Abs Immature Granulocytes: 0.01 10*3/uL (ref 0.00–0.07)
Basophils Absolute: 0 10*3/uL (ref 0.0–0.1)
Basophils Relative: 0 %
Eosinophils Absolute: 0.2 10*3/uL (ref 0.0–0.5)
Eosinophils Relative: 3 %
HCT: 39.6 % (ref 36.0–46.0)
Hemoglobin: 13.3 g/dL (ref 12.0–15.0)
Immature Granulocytes: 0 %
Lymphocytes Relative: 38 %
Lymphs Abs: 2.4 10*3/uL (ref 0.7–4.0)
MCH: 30.6 pg (ref 26.0–34.0)
MCHC: 33.6 g/dL (ref 30.0–36.0)
MCV: 91 fL (ref 80.0–100.0)
Monocytes Absolute: 0.5 10*3/uL (ref 0.1–1.0)
Monocytes Relative: 9 %
Neutro Abs: 3.1 10*3/uL (ref 1.7–7.7)
Neutrophils Relative %: 50 %
Platelets: 225 10*3/uL (ref 150–400)
RBC: 4.35 MIL/uL (ref 3.87–5.11)
RDW: 12.9 % (ref 11.5–15.5)
WBC: 6.2 10*3/uL (ref 4.0–10.5)
nRBC: 0 % (ref 0.0–0.2)

## 2020-12-13 LAB — PROTIME-INR
INR: 0.9 (ref 0.8–1.2)
Prothrombin Time: 12.2 seconds (ref 11.4–15.2)

## 2020-12-13 LAB — POCT ACTIVATED CLOTTING TIME
Activated Clotting Time: 225 seconds
Activated Clotting Time: 283 seconds
Activated Clotting Time: 289 seconds

## 2020-12-13 LAB — SARS CORONAVIRUS 2 BY RT PCR (HOSPITAL ORDER, PERFORMED IN ~~LOC~~ HOSPITAL LAB): SARS Coronavirus 2: NEGATIVE

## 2020-12-13 SURGERY — IR WITH ANESTHESIA
Anesthesia: General

## 2020-12-13 MED ORDER — CHLORHEXIDINE GLUCONATE CLOTH 2 % EX PADS
6.0000 | MEDICATED_PAD | Freq: Every day | CUTANEOUS | Status: DC
  Administered 2020-12-13 – 2020-12-21 (×8): 6 via TOPICAL

## 2020-12-13 MED ORDER — HEPARIN SODIUM (PORCINE) 1000 UNIT/ML IJ SOLN
INTRAMUSCULAR | Status: DC | PRN
  Administered 2020-12-13: 3000 [IU] via INTRAVENOUS

## 2020-12-13 MED ORDER — EPINEPHRINE 0.3 MG/0.3ML IJ SOAJ
0.3000 mg | INTRAMUSCULAR | Status: DC | PRN
  Filled 2020-12-13: qty 0.6

## 2020-12-13 MED ORDER — CARBOXYMETHYLCELLULOSE SODIUM 0.5 % OP SOLN: 1.0000 [drp] | Freq: Three times a day (TID) | OPHTHALMIC | Status: DC | PRN

## 2020-12-13 MED ORDER — LIDOCAINE 2% (20 MG/ML) 5 ML SYRINGE
INTRAMUSCULAR | Status: DC | PRN
  Administered 2020-12-13: 60 mg via INTRAVENOUS

## 2020-12-13 MED ORDER — IOHEXOL 240 MG/ML SOLN
100.0000 mL | Freq: Once | INTRAMUSCULAR | Status: AC | PRN
  Administered 2020-12-13: 100 mL via INTRA_ARTERIAL

## 2020-12-13 MED ORDER — IRBESARTAN 75 MG PO TABS
75.0000 mg | ORAL_TABLET | Freq: Every day | ORAL | Status: DC
  Administered 2020-12-14 – 2020-12-22 (×6): 75 mg via ORAL
  Filled 2020-12-13 (×9): qty 1

## 2020-12-13 MED ORDER — PHENYLEPHRINE HCL (PRESSORS) 10 MG/ML IV SOLN
INTRAVENOUS | Status: DC | PRN
  Administered 2020-12-13 (×2): 40 ug via INTRAVENOUS
  Administered 2020-12-13 (×2): 80 ug via INTRAVENOUS

## 2020-12-13 MED ORDER — VERAPAMIL HCL 2.5 MG/ML IV SOLN
INTRAVENOUS | Status: AC
  Filled 2020-12-13: qty 2

## 2020-12-13 MED ORDER — RIZATRIPTAN BENZOATE 10 MG PO TBDP: 10.0000 mg | ORAL_TABLET | ORAL | Status: DC | PRN

## 2020-12-13 MED ORDER — HEPARIN (PORCINE) 25000 UT/250ML-% IV SOLN
500.0000 [IU]/h | INTRAVENOUS | Status: DC
  Administered 2020-12-13: 500 [IU]/h via INTRAVENOUS
  Filled 2020-12-13: qty 250

## 2020-12-13 MED ORDER — NITROGLYCERIN 1 MG/10 ML FOR IR/CATH LAB
INTRA_ARTERIAL | Status: AC
  Filled 2020-12-13: qty 10

## 2020-12-13 MED ORDER — PROTAMINE SULFATE 10 MG/ML IV SOLN
INTRAVENOUS | Status: DC | PRN
Start: 2020-12-13 — End: 2020-12-14
  Administered 2020-12-13 (×2): 5 mg via INTRAVENOUS

## 2020-12-13 MED ORDER — PREGABALIN 75 MG PO CAPS
150.0000 mg | ORAL_CAPSULE | Freq: Two times a day (BID) | ORAL | Status: DC
  Administered 2020-12-13 – 2020-12-22 (×18): 150 mg via ORAL
  Filled 2020-12-13 (×18): qty 2

## 2020-12-13 MED ORDER — SUGAMMADEX SODIUM 200 MG/2ML IV SOLN
INTRAVENOUS | Status: DC | PRN
  Administered 2020-12-13: 200 mg via INTRAVENOUS

## 2020-12-13 MED ORDER — ASPIRIN EC 325 MG PO TBEC
325.0000 mg | DELAYED_RELEASE_TABLET | ORAL | Status: DC
  Filled 2020-12-13: qty 1

## 2020-12-13 MED ORDER — PANTOPRAZOLE SODIUM 40 MG PO TBEC
40.0000 mg | DELAYED_RELEASE_TABLET | Freq: Every day | ORAL | Status: DC
  Administered 2020-12-14 – 2020-12-22 (×9): 40 mg via ORAL
  Filled 2020-12-13 (×9): qty 1

## 2020-12-13 MED ORDER — EPTIFIBATIDE 20 MG/10ML IV SOLN
INTRAVENOUS | Status: AC
  Filled 2020-12-13: qty 10

## 2020-12-13 MED ORDER — DICYCLOMINE HCL 10 MG PO CAPS
10.0000 mg | ORAL_CAPSULE | Freq: Three times a day (TID) | ORAL | Status: DC | PRN
  Filled 2020-12-13: qty 1

## 2020-12-13 MED ORDER — IOHEXOL 240 MG/ML SOLN: 50.0000 mL | Freq: Once | INTRAMUSCULAR | Status: DC | PRN

## 2020-12-13 MED ORDER — HEPARIN SODIUM (PORCINE) 1000 UNIT/ML IJ SOLN
INTRAMUSCULAR | Status: AC
  Filled 2020-12-13: qty 1

## 2020-12-13 MED ORDER — CLOPIDOGREL BISULFATE 75 MG PO TABS
75.0000 mg | ORAL_TABLET | Freq: Every day | ORAL | Status: DC
  Filled 2020-12-13: qty 1

## 2020-12-13 MED ORDER — SODIUM CHLORIDE 0.9 % IV SOLN: INTRAVENOUS | Status: DC

## 2020-12-13 MED ORDER — PHENYLEPHRINE HCL-NACL 20-0.9 MG/250ML-% IV SOLN
INTRAVENOUS | Status: DC | PRN
  Administered 2020-12-13: 25 ug/min via INTRAVENOUS

## 2020-12-13 MED ORDER — ALPRAZOLAM 0.25 MG PO TABS
0.2500 mg | ORAL_TABLET | Freq: Four times a day (QID) | ORAL | Status: DC | PRN
  Administered 2020-12-14 – 2020-12-22 (×7): 0.25 mg via ORAL
  Filled 2020-12-13 (×7): qty 1

## 2020-12-13 MED ORDER — ROCURONIUM BROMIDE 10 MG/ML (PF) SYRINGE
PREFILLED_SYRINGE | INTRAVENOUS | Status: DC | PRN
  Administered 2020-12-13 (×2): 20 mg via INTRAVENOUS
  Administered 2020-12-13: 10 mg via INTRAVENOUS
  Administered 2020-12-13: 40 mg via INTRAVENOUS
  Administered 2020-12-13: 10 mg via INTRAVENOUS

## 2020-12-13 MED ORDER — ACETAMINOPHEN 325 MG PO TABS
650.0000 mg | ORAL_TABLET | Freq: Four times a day (QID) | ORAL | Status: DC | PRN
  Administered 2020-12-13 – 2020-12-19 (×6): 650 mg via ORAL
  Filled 2020-12-13 (×6): qty 2

## 2020-12-13 MED ORDER — DULOXETINE HCL 60 MG PO CPEP
60.0000 mg | ORAL_CAPSULE | Freq: Every day | ORAL | Status: DC
  Administered 2020-12-14 – 2020-12-22 (×9): 60 mg via ORAL
  Filled 2020-12-13 (×9): qty 1

## 2020-12-13 MED ORDER — CLEVIDIPINE BUTYRATE 0.5 MG/ML IV EMUL
0.0000 mg/h | INTRAVENOUS | Status: DC
  Administered 2020-12-13 (×2): 5 mg/h via INTRAVENOUS
  Filled 2020-12-13 (×2): qty 50

## 2020-12-13 MED ORDER — SUMATRIPTAN SUCCINATE 25 MG PO TABS
25.0000 mg | ORAL_TABLET | ORAL | Status: DC | PRN
  Administered 2020-12-14 (×2): 25 mg via ORAL
  Filled 2020-12-13 (×4): qty 1

## 2020-12-13 MED ORDER — POLYVINYL ALCOHOL 1.4 % OP SOLN
1.0000 [drp] | Freq: Three times a day (TID) | OPHTHALMIC | Status: DC | PRN
  Filled 2020-12-13: qty 15

## 2020-12-13 MED ORDER — CHLORHEXIDINE GLUCONATE 0.12 % MT SOLN
15.0000 mL | Freq: Once | OROMUCOSAL | Status: AC
  Administered 2020-12-13: 15 mL via OROMUCOSAL
  Filled 2020-12-13: qty 15

## 2020-12-13 MED ORDER — DEXAMETHASONE SODIUM PHOSPHATE 10 MG/ML IJ SOLN
INTRAMUSCULAR | Status: DC | PRN
  Administered 2020-12-13: 5 mg via INTRAVENOUS

## 2020-12-13 MED ORDER — HEPARIN (PORCINE) 25000 UT/250ML-% IV SOLN
INTRAVENOUS | Status: AC
  Filled 2020-12-13: qty 250

## 2020-12-13 MED ORDER — LEVOTHYROXINE SODIUM 75 MCG PO TABS
75.0000 ug | ORAL_TABLET | Freq: Every day | ORAL | Status: DC
  Administered 2020-12-14 – 2020-12-22 (×9): 75 ug via ORAL
  Filled 2020-12-13 (×9): qty 1

## 2020-12-13 MED ORDER — ONDANSETRON 4 MG PO TBDP
4.0000 mg | ORAL_TABLET | Freq: Three times a day (TID) | ORAL | Status: DC | PRN
  Administered 2020-12-14: 4 mg via ORAL
  Filled 2020-12-13: qty 1

## 2020-12-13 MED ORDER — EPTIFIBATIDE 20 MG/10ML IV SOLN
INTRAVENOUS | Status: AC | PRN
  Administered 2020-12-13 (×2): 1.5 mg via INTRA_ARTERIAL

## 2020-12-13 MED ORDER — PROPOFOL 10 MG/ML IV BOLUS
INTRAVENOUS | Status: DC | PRN
  Administered 2020-12-13: 60 mg via INTRAVENOUS
  Administered 2020-12-13: 30 mg via INTRAVENOUS
  Administered 2020-12-13: 100 mg via INTRAVENOUS

## 2020-12-13 MED ORDER — SODIUM CHLORIDE 0.9 % IV SOLN: INTRAVENOUS | Status: DC | PRN

## 2020-12-13 MED ORDER — BACLOFEN 10 MG PO TABS
5.0000 mg | ORAL_TABLET | Freq: Three times a day (TID) | ORAL | Status: DC
  Administered 2020-12-15 – 2020-12-22 (×17): 5 mg via ORAL
  Filled 2020-12-13 (×2): qty 0.5
  Filled 2020-12-13 (×2): qty 1
  Filled 2020-12-13 (×2): qty 0.5
  Filled 2020-12-13: qty 1
  Filled 2020-12-13: qty 0.5
  Filled 2020-12-13: qty 1
  Filled 2020-12-13 (×7): qty 0.5
  Filled 2020-12-13: qty 1
  Filled 2020-12-13: qty 0.5
  Filled 2020-12-13: qty 1
  Filled 2020-12-13 (×5): qty 0.5
  Filled 2020-12-13 (×4): qty 1

## 2020-12-13 MED ORDER — FENTANYL CITRATE (PF) 250 MCG/5ML IJ SOLN
INTRAMUSCULAR | Status: DC | PRN
  Administered 2020-12-13: 100 ug via INTRAVENOUS

## 2020-12-13 MED ORDER — ASPIRIN 81 MG PO CHEW
81.0000 mg | CHEWABLE_TABLET | Freq: Every day | ORAL | Status: DC
  Filled 2020-12-13 (×3): qty 1

## 2020-12-13 MED ORDER — CLEVIDIPINE BUTYRATE 0.5 MG/ML IV EMUL
INTRAVENOUS | Status: DC | PRN
  Administered 2020-12-13: 1 mg/h via INTRAVENOUS

## 2020-12-13 MED ORDER — AVENOVA 0.01 % EX SOLN: 1.0000 "application " | Freq: Two times a day (BID) | CUTANEOUS | Status: DC | PRN

## 2020-12-13 MED ORDER — PROPRANOLOL HCL 10 MG PO TABS
60.0000 mg | ORAL_TABLET | Freq: Every day | ORAL | Status: DC
  Administered 2020-12-14 – 2020-12-22 (×6): 60 mg via ORAL
  Filled 2020-12-13: qty 3
  Filled 2020-12-13: qty 6
  Filled 2020-12-13 (×2): qty 3
  Filled 2020-12-13: qty 6
  Filled 2020-12-13: qty 3
  Filled 2020-12-13: qty 6
  Filled 2020-12-13: qty 3
  Filled 2020-12-13: qty 6

## 2020-12-13 MED ORDER — CLOPIDOGREL BISULFATE 75 MG PO TABS
75.0000 mg | ORAL_TABLET | Freq: Every day | ORAL | Status: DC
  Administered 2020-12-14 – 2020-12-19 (×6): 75 mg via ORAL
  Filled 2020-12-13 (×6): qty 1

## 2020-12-13 MED ORDER — CEFAZOLIN SODIUM-DEXTROSE 2-4 GM/100ML-% IV SOLN
2.0000 g | INTRAVENOUS | Status: AC
  Administered 2020-12-13: 2 g via INTRAVENOUS
  Filled 2020-12-13: qty 100

## 2020-12-13 MED ORDER — ASPIRIN 81 MG PO CHEW
81.0000 mg | CHEWABLE_TABLET | Freq: Every day | ORAL | Status: DC
  Administered 2020-12-14 – 2020-12-22 (×9): 81 mg via ORAL
  Filled 2020-12-13 (×8): qty 1

## 2020-12-13 MED ORDER — HEPARIN (PORCINE) 25000 UT/250ML-% IV SOLN
300.0000 [IU]/h | INTRAVENOUS | Status: DC
  Filled 2020-12-13: qty 250

## 2020-12-13 MED ORDER — CLOPIDOGREL BISULFATE 75 MG PO TABS
75.0000 mg | ORAL_TABLET | ORAL | Status: AC
  Administered 2020-12-13: 75 mg via ORAL
  Filled 2020-12-13: qty 1

## 2020-12-13 MED ORDER — ORAL CARE MOUTH RINSE: 15.0000 mL | Freq: Once | OROMUCOSAL | Status: AC

## 2020-12-13 MED ORDER — IOHEXOL 240 MG/ML SOLN
100.0000 mL | Freq: Once | INTRAMUSCULAR | Status: AC | PRN
  Administered 2020-12-13: 85 mL via INTRA_ARTERIAL

## 2020-12-13 MED ORDER — LACTATED RINGERS IV SOLN: INTRAVENOUS | Status: DC

## 2020-12-13 MED ORDER — NIMODIPINE 30 MG PO CAPS
0.0000 mg | ORAL_CAPSULE | ORAL | Status: DC
  Filled 2020-12-13: qty 2

## 2020-12-13 MED ORDER — ONDANSETRON HCL 4 MG/2ML IJ SOLN
INTRAMUSCULAR | Status: DC | PRN
  Administered 2020-12-13: 4 mg via INTRAVENOUS

## 2020-12-13 MED ORDER — KETOROLAC TROMETHAMINE 30 MG/ML IJ SOLN
30.0000 mg | Freq: Once | INTRAMUSCULAR | Status: AC
  Administered 2020-12-13: 30 mg via INTRAVENOUS
  Filled 2020-12-13: qty 1

## 2020-12-13 NOTE — Progress Notes (Signed)
ANTICOAGULATION CONSULT NOTE - Initial Consult  Pharmacy Consult for heparin  Indication:  s/p embolization left MCA aneurysm  Allergies  Allergen Reactions   Other Anaphylaxis, Swelling and Other (See Comments)    Walnuts or any nuts   Sulfa Antibiotics Shortness Of Breath and Other (See Comments)    Swelling of lips    Sunscreens Anaphylaxis, Hives and Other (See Comments)    Titanium and zinc oxides Eye swelling    Lamictal [Lamotrigine]     Weird Ticks    Seroquel [Quetiapine] Other (See Comments)    Pychosis (foggy, uncoordinated, slurred speech)   Tape Hives    Patient Measurements: Height: '5\' 6"'$  (167.6 cm) Weight: 60.8 kg (134 lb) IBW/kg (Calculated) : 59.3 Heparin Dosing Weight: TBW  Vital Signs: Temp: 97.7 F (36.5 C) (09/07 1315) Temp Source: Oral (09/07 0641) BP: 116/61 (09/07 1445) Pulse Rate: 84 (09/07 1445)  Labs: Recent Labs    12/13/20 0645  HGB 13.3  HCT 39.6  PLT 225  LABPROT 12.2  INR 0.9  CREATININE 0.64    Estimated Creatinine Clearance: 65.6 mL/min (by C-G formula based on SCr of 0.64 mg/dL).   Medical History: Past Medical History:  Diagnosis Date   Acute pericarditis 12/2013   Bipolar II disorder    Possible diagnosis   Complication of anesthesia    Effects lasted for 3 days   Dyslipidemia    Generalized anxiety disorder with panic attacks    Hx of lithium toxicity    Hx of migraines    On propranolol.   Hypertension    Hypothyroid    Major depressive disorder 04/16/2013   Obstructive sleep apnea    Currently untreatred; oral device caused TMJ; unsuccessful with CPAP   Pericardial effusion 01/03/2014   Moderate without hemodynamic compromise.   Sinus tachycardia     Medications:  Medications Prior to Admission  Medication Sig Dispense Refill Last Dose   ALPRAZolam (XANAX) 0.25 MG tablet Take 0.25 mg by mouth 4 (four) times daily as needed for anxiety.    12/12/2020   aspirin EC 81 MG tablet Take 81 mg by mouth daily.  Swallow whole.   12/13/2020 at 0430   Baclofen 5 MG TABS Take 5 mg by mouth 3 (three) times daily. (Patient taking differently: Take 5 mg by mouth in the morning and at bedtime.) 90 tablet 5 12/12/2020   carboxymethylcellulose (REFRESH PLUS) 0.5 % SOLN Place 1 drop into both eyes 3 (three) times daily as needed (dry eyes).   Past Week   clopidogrel (PLAVIX) 75 MG tablet Take 75 mg by mouth daily.   12/13/2020   dicyclomine (BENTYL) 10 MG capsule Take 1 capsule (10 mg total) by mouth 3 (three) times daily before meals. (Patient taking differently: Take 10 mg by mouth 3 (three) times daily as needed for spasms.) 90 capsule 4 Past Week   DULoxetine (CYMBALTA) 60 MG capsule Take 60 mg by mouth daily.   12/13/2020 at [place] 2-PAK 0.3 MG/0.3ML SOAJ injection Inject 0.3 mg into the muscle as needed for anaphylaxis.  1    levothyroxine (SYNTHROID, LEVOTHROID) 75 MCG tablet Take 75 mcg by mouth daily before breakfast.   12/13/2020 at 0430   omeprazole (PRILOSEC) 20 MG capsule Take 20 mg by mouth daily.   12/13/2020 at 0430   ondansetron (ZOFRAN ODT) 4 MG disintegrating tablet Take 1 tablet (4 mg total) by mouth every 8 (eight) hours as needed for nausea or vomiting. 20 tablet 5 Past  Week   pregabalin (LYRICA) 150 MG capsule Take 1 capsule (150 mg total) by mouth 2 (two) times daily. 60 capsule 5 12/13/2020 at 0430   propranolol (INDERAL) 60 MG tablet Take 60 mg by mouth daily.   12/13/2020 at 0430   rizatriptan (MAXALT-MLT) 10 MG disintegrating tablet Take 10 mg by mouth as needed for migraine. May repeat in 2 hours if needed   Past Month   valsartan (DIOVAN) 80 MG tablet Take 80 mg by mouth daily.   12/12/2020   diphenhydrAMINE (BENADRYL) 25 MG tablet Take 25 mg by mouth daily as needed for allergies.   More than a month   Eyelid Cleansers (AVENOVA) 0.01 % SOLN Apply 1 application topically 2 (two) times daily as needed (eyelid cleanser).   More than a month    Assessment: 48 yof s/p cerebral arteriogram followed by  embolization of wide neck lobulated left MCA aneurysm. Right lateral thigh hematoma noted. CBC normal on admit. Pharmacy consulted to manage IV heparin drip post-op.  Goal of Therapy:  Heparin level 0.1-0.25 units/ml Monitor platelets by anticoagulation protocol: Yes   Plan:  Continue heparin drip at 500 units/hr 6h heparin level Heparin drip off 9/8 at 13:00  Thank you for involving pharmacy in this patient's care.  Renold Genta, PharmD, BCPS Clinical Pharmacist Clinical phone for 12/13/2020 until 10p is [phone] 12/13/2020 3:20 PM  **Pharmacist phone directory can be found on [email or web address] listed under [place]**

## 2020-12-13 NOTE — Progress Notes (Signed)
ANTICOAGULATION CONSULT NOTE  Pharmacy Consult for heparin  Indication:  s/p embolization left MCA aneurysm  Allergies  Allergen Reactions   Other Anaphylaxis, Swelling and Other (See Comments)    Walnuts or any nuts   Sulfa Antibiotics Shortness Of Breath and Other (See Comments)    Swelling of lips    Sunscreens Anaphylaxis, Hives and Other (See Comments)    Titanium and zinc oxides Eye swelling    Lamictal [Lamotrigine]     Weird Ticks    Seroquel [Quetiapine] Other (See Comments)    Pychosis (foggy, uncoordinated, slurred speech)   Tape Hives    Patient Measurements: Height: '5\' 6"'$  (167.6 cm) Weight: 60.8 kg (134 lb) IBW/kg (Calculated) : 59.3 Heparin Dosing Weight: TBW  Vital Signs: Temp: 98 F (36.7 C) (09/07 2000) Temp Source: Oral (09/07 2000) BP: 118/64 (09/07 2100) Pulse Rate: 76 (09/07 2100)  Labs: Recent Labs    12/13/20 0645 12/13/20 1813  HGB 13.3  --   HCT 39.6  --   PLT 225  --   LABPROT 12.2  --   INR 0.9  --   HEPARINUNFRC  --  0.72*  CREATININE 0.64  --      Estimated Creatinine Clearance: 65.6 mL/min (by C-G formula based on SCr of 0.64 mg/dL).   Assessment: 16 yof s/p cerebral arteriogram followed by embolization of wide neck lobulated left MCA aneurysm. Right lateral thigh hematoma noted. CBC normal on admit. Pharmacy consulted to manage IV heparin drip post-op.  Heparin level is supratherapeutic at 0.72 on 500 units/hr (8.2 units/kg/hr). Noted that heparin was stopped due to bleeding/oozing. Spoke with RN and heparin level was drawn from a-line and infusing in PIV. Unclear why heparin level is high despite low rate.  Heparin off this evening, now post MRI with new orders to resume heparin. Given elevated level this afternoon will reduce rate on restart. Discussed with Rn.   Goal of Therapy:  Heparin level 0.1-0.25 units/ml Monitor platelets by anticoagulation protocol: Yes   Plan:  Restart heparin at 300 units/hr Recheck heparin  level in am  Thank you for involving pharmacy in this patient's care.  Erin Hearing PharmD., BCPS Clinical Pharmacist 12/13/2020 10:49 PM

## 2020-12-13 NOTE — Sedation Documentation (Signed)
8 fr Angio seal deployed at [employer]

## 2020-12-13 NOTE — Consult Note (Signed)
Neurology Consultation  Reason for Consult: Word finding difficulty postprocedure Referring Physician: Dr. Estanislado Pandy, neuro interventional radiology  CC: Word finding difficulty  History is obtained from: Patient, chart  HPI: Teresa Galloway is a 65 y.o. female medical history of hypertension hypothyroidism, acute pericarditis pericardial effusion 2015, MDD, bipolar disorder, migraines, left MCA aneurysm which was treated by endovascular intervention this afternoon by Dr. Estanislado Pandy, followed by some groin site bleeding complications, was noted to have word finding difficulty and neurological consultation was obtained. According to the handoff from the providers, she was in for endovascular treatment of a 4 x 4 x 2 mm left MCA bifurcation aneurysm, which was done this afternoon and was in her usual state of health prior to the procedure.  The interventionalists was called to assess the groin for increased bleeding this evening and on his examination noted her to be having subtle word finding difficulty.  This was not reported during regular neurochecks.  Unclear last known well-best estimate of last known well is around noon. Patient was very cooperative with the exam and said that she is worried why so many people are coming and checking on her.  When we told her that we think that she is having some word finding difficulty as evidenced by her inability to clearly describe the picture on NIH stroke scale, she did not make much of it-she did mention that she felt that she had some sort of a brain fog but did not know how to explain it more. No chest pain shortness of breath nausea vomiting.  No tingling numbness or weakness anywhere reported subjectively.   LKW: 12 PM on 12/13/2020 tpa given?: no, currently on heparin drip post endovascular treatment of left MCA aneurysm with embolization followed by pipeline diverter device Premorbid modified Rankin scale (mRS): 0   ROS: Full ROS was performed and  is negative except as noted in the HPI.   Past Medical History:  Diagnosis Date   Acute pericarditis 12/2013   Bipolar II disorder    Possible diagnosis   Complication of anesthesia    Effects lasted for 3 days   Dyslipidemia    Generalized anxiety disorder with panic attacks    Hx of lithium toxicity    Hx of migraines    On propranolol.   Hypertension    Hypothyroid    Major depressive disorder 04/16/2013   Obstructive sleep apnea    Currently untreatred; oral device caused TMJ; unsuccessful with CPAP   Pericardial effusion 01/03/2014   Moderate without hemodynamic compromise.   Sinus tachycardia         Family History  Problem Relation Age of Onset   Depression Mother    Depression Father    Depression Sister    Anxiety disorder Sister    Alcohol abuse Maternal Uncle    Alcohol abuse Paternal Uncle    Alcohol abuse Maternal Grandfather    Depression Maternal Grandmother      Social History:   reports that she quit smoking about 8 years ago. Her smoking use included cigarettes. She started smoking about 45 years ago. She has a 30.00 pack-year smoking history. She has never used smokeless tobacco. She reports that she does not currently use alcohol after a past usage of about 3.0 standard drinks per week. She reports that she does not use drugs.  Medications  Current Facility-Administered Medications:    0.9 %  sodium chloride infusion, , Intravenous, Continuous, Deveshwar, Sanjeev, MD   acetaminophen (TYLENOL) tablet 650  mg, 650 mg, Oral, Q6H PRN, Luanne Bras, MD, 650 mg at 12/13/20 1719   ALPRAZolam (XANAX) tablet 0.25 mg, 0.25 mg, Oral, QID PRN, Han, Aimee H, PA-C   [START ON 12/14/2020] aspirin chewable tablet 81 mg, 81 mg, Oral, Daily **OR** [START ON 12/14/2020] aspirin chewable tablet 81 mg, 81 mg, Per Tube, Daily, Deveshwar, Sanjeev, MD   baclofen (LIORESAL) tablet 5 mg, 5 mg, Oral, TID, Han, Aimee H, PA-C   Chlorhexidine Gluconate Cloth 2 % PADS 6 each,  6 each, Topical, Daily, Deveshwar, Sanjeev, MD, 6 each at 12/13/20 1708   clevidipine (CLEVIPREX) infusion 0.5 mg/mL, 0-21 mg/hr, Intravenous, Continuous, Deveshwar, Sanjeev, MD, Last Rate: 10 mL/hr at 12/13/20 2000, 5 mg/hr at 12/13/20 2000   [START ON 12/14/2020] clopidogrel (PLAVIX) tablet 75 mg, 75 mg, Oral, Daily **OR** [START ON 12/14/2020] clopidogrel (PLAVIX) tablet 75 mg, 75 mg, Per Tube, Daily, Deveshwar, Sanjeev, MD   dicyclomine (BENTYL) capsule 10 mg, 10 mg, Oral, TID PRN, Han, Aimee H, PA-C   [START ON 12/14/2020] DULoxetine (CYMBALTA) DR capsule 60 mg, 60 mg, Oral, Daily, Han, Aimee H, PA-C   EPINEPHrine (EPI-PEN) injection 0.3 mg, 0.3 mg, Intramuscular, PRN, Han, Aimee H, PA-C   eptifibatide (INTEGRILIN) 20 MG/10ML injection, , , ,    heparin 25000 UT/250ML infusion, , , ,    heparin ADULT infusion 100 units/mL (25000 units/280m), 500 Units/hr, Intravenous, Continuous, Last Rate: 5 mL/hr at 12/13/20 1321, 500 Units/hr at 12/13/20 1321 **AND** heparin per pharmacy consult, , , Until Discontinued, Deveshwar, Sanjeev, MD   irbesartan (AVAPRO) tablet 75 mg, 75 mg, Oral, Daily, Han, Aimee H, PA-C   [START ON 12/14/2020] levothyroxine (SYNTHROID) tablet 75 mcg, 75 mcg, Oral, Q0600, Han, Aimee H, PA-C   ondansetron (ZOFRAN-ODT) disintegrating tablet 4 mg, 4 mg, Oral, Q8H PRN, Han, Aimee H, PA-C   [START ON 12/14/2020] pantoprazole (PROTONIX) EC tablet 40 mg, 40 mg, Oral, Daily, Han, Aimee H, PA-C   polyvinyl alcohol (LIQUIFILM TEARS) 1.4 % ophthalmic solution 1 drop, 1 drop, Both Eyes, TID PRN, PLevada Dy Dwayne A, RPH   pregabalin (LYRICA) capsule 150 mg, 150 mg, Oral, BID, Han, Aimee H, PA-C   [START ON 12/14/2020] propranolol (INDERAL) tablet 60 mg, 60 mg, Oral, Daily, Han, Aimee H, PA-C   SUMAtriptan (IMITREX) tablet 25 mg, 25 mg, Oral, Q2H PRN, Pierce, Dwayne A, RPH  Facility-Administered Medications Ordered in Other Encounters:    0.9 %  sodium chloride infusion, , Intravenous, Continuous PRN,  Holtzman, Ariel Leffew, CRNA, Stopped at 12/13/20 0853   0.9 %  sodium chloride infusion, , Intravenous, Continuous PRN, Holtzman, Ariel Leffew, CRNA, New Bag at 12/13/20 1247   clevidipine (CLEVIPREX) infusion 0.5 mg/mL, , Intravenous, Continuous PRN, Holtzman, Ariel Leffew, CRNA, Last Rate: 6 mL/hr at 12/13/20 1302, 3 mg/hr at 12/13/20 1302   dexamethasone (DECADRON) injection, , Intravenous, Anesthesia Intra-op, Holtzman, Ariel Leffew, CRNA, 5 mg at 12/13/20 0925   fentaNYL citrate (PF) (SUBLIMAZE) injection, , Intravenous, Anesthesia Intra-op, Holtzman, Ariel Leffew, CRNA, 100 mcg at 12/13/20 0847   heparin sodium (porcine) injection, , Intravenous, Anesthesia Intra-op, Holtzman, Ariel Leffew, CRNA, 3,000 Units at 12/13/20 0918   iohexol (OMNIPAQUE) 240 MG/ML injection 50 mL, 50 mL, Intra-arterial, Once PRN, Deveshwar, Sanjeev, MD   lidocaine 2% (20 mg/mL) 5 mL syringe, , Intravenous, Anesthesia Intra-op, Holtzman, Ariel Leffew, CRNA, 60 mg at 12/13/20 0848   nitroGLYCERIN 100 mcg/mL intra-arterial injection, , , ,    ondansetron (ZOFRAN) injection, , Intravenous, Anesthesia Intra-op, Holtzman, Ariel Leffew,  CRNA, 4 mg at 12/13/20 1220   phenylephrine (NEO-SYNEPHRINE) '20mg'$ /NS 295m premix infusion, , Intravenous, Continuous PRN, Holtzman, Ariel Leffew, CRNA, Stopped at 12/13/20 1233   phenylephrine (NEO-SYNEPHRINE) injection, , Intravenous, Anesthesia Intra-op, Holtzman, Ariel Leffew, CRNA, 40 mcg at 12/13/20 1102   propofol (DIPRIVAN) 10 mg/mL bolus/IV push, , Intravenous, Anesthesia Intra-op, Holtzman, Ariel Leffew, CRNA, 30 mg at 12/13/20 0950   protamine injection, , Intravenous, Anesthesia Intra-op, Holtzman, Ariel Leffew, CRNA, 5 mg at 12/13/20 1253   rocuronium bromide 10 mg/mL (PF) syringe, , Intravenous, Anesthesia Intra-op, Holtzman, Ariel Leffew, CRNA, 10 mg at 12/13/20 1215   sugammadex sodium (BRIDION) injection, , Intravenous, Anesthesia Intra-op, Holtzman, Ariel Leffew, CRNA, 200 mg  at 12/13/20 1233   Exam: Current vital signs: BP 118/64 (BP Location: Right Arm)   Pulse 76   Temp 98 F (36.7 C) (Oral)   Resp 16   Ht '5\' 6"'$  (1.676 m)   Wt 60.8 kg   SpO2 96%   BMI 21.63 kg/m  Vital signs in last 24 hours: Temp:  [97.6 F (36.4 C)-98.2 F (36.8 C)] 98 F (36.7 C) (09/07 2000) Pulse Rate:  [68-87] 76 (09/07 2100) Resp:  [12-21] 16 (09/07 2100) BP: (101-143)/(50-64) 118/64 (09/07 2100) SpO2:  [92 %-98 %] 96 % (09/07 2045) Arterial Line BP: (101-150)/(52-69) 150/69 (09/07 2045) Weight:  [60.8 kg] 60.8 kg (09/07 0641)  GENERAL: Awake, alert in NAD HEENT: - Normocephalic and atraumatic, dry mm, no LN++, no Thyromegally LUNGS - Clear to auscultation bilaterally with no wheezes CV - S1S2 RRR, no m/r/g, equal pulses bilaterally. ABDOMEN - Soft, nontender, nondistended with normoactive BS Ext: Warm well perfused.  The right groin site had a lot of blood around it but appeared to not be expanding per the RN.  NEURO:  Mental Status: AA&Ox3  Language: speech is nondysarthric.she has subtle word finding difficulty while trying to explain the NIH stroke scale pictures.  Repetition intact, fluency intact, naming intact, comprehension intact. Cranial Nerves: PERRL. EOMI, visual fields full, no facial asymmetry, facial sensation intact, hearing intact, tongue/uvula/soft palate midline, normal sternocleidomastoid and trapezius muscle strength. No evidence of tongue atrophy or fibrillations Motor: Right lower extremity not tested due to the groin hematoma.  Right upper, left upper and left lower extremity full strength 5/5. Tone: is normal and bulk is normal Sensation- Intact to light touch bilaterally, no extinction Coordination: FTN intact bilaterall Gait- deferred  NIHSS-1-for mild aphasia/word finding difficulty   Labs I have reviewed labs in epic and the results pertinent to this consultation are:  CBC    Component Value Date/Time   WBC 6.2 12/13/2020 0645    RBC 4.35 12/13/2020 0645   HGB 13.3 12/13/2020 0645   HCT 39.6 12/13/2020 0645   PLT 225 12/13/2020 0645   MCV 91.0 12/13/2020 0645   MCH 30.6 12/13/2020 0645   MCHC 33.6 12/13/2020 0645   RDW 12.9 12/13/2020 0645   LYMPHSABS 2.4 12/13/2020 0645   MONOABS 0.5 12/13/2020 0645   EOSABS 0.2 12/13/2020 0645   BASOSABS 0.0 12/13/2020 0645    CMP     Component Value Date/Time   NA 139 12/13/2020 0645   K 3.6 12/13/2020 0645   CL 105 12/13/2020 0645   CO2 27 12/13/2020 0645   GLUCOSE 98 12/13/2020 0645   BUN 18 12/13/2020 0645   CREATININE 0.64 12/13/2020 0645   CALCIUM 8.9 12/13/2020 0645   PROT 6.9 06/27/2020 1349   ALBUMIN 4.4 06/27/2020 1349   AST 14 06/27/2020 1349  ALT 14 06/27/2020 1349   ALKPHOS 91 06/27/2020 1349   BILITOT 0.3 06/27/2020 1349   GFRNONAA >60 12/13/2020 0645   GFRAA >60 01/09/2018 1114    Imaging I have reviewed the images obtained:  MR head without contrast and MR angio head without contrast ordered stat  Assessment: 65 year old status post treatment of left MCA bifurcation aneurysm by embolization and flow diverter device with word finding difficulty postprocedure. Likely small scattered embolic strokes due to micro emboli periprocedural he. Not a candidate for IV tPA due to being outside the window as well as low NIH stroke scale as well as being on heparin drip.  Recommendations: Stat MRI brain and MRA head without contrast 2D echo A1c Lipid panel Frequent neurochecks Frequent groin checks per postprocedure endovascular order set Blood pressure goal per endovascular 1 [id number] systolic to allow for some degree of permissive hypertension but not enough to risk bleeding. Heparin drip per neuro IR Aspirin and Plavix per neuro IR PT OT Speech therapy N.p.o. until cleared by bedside swallow screen. Stroke team will follow with you. Plan discussed with Dr. Estanislado Pandy on the unit Plan also discussed with bedside/charge RN.  -- Amie Portland,  MD Neurologist [facility] Pager: [phone]    ADDENDUM MR completed and reviewed.  MRI HEAD IMPRESSION: 1. Subtle scattered small volume acute left MCA distribution infarcts as above. No associated mass effect. 2. Single new focus of susceptibility artifact involving the left frontal lobe, of uncertain significance, with no definite evidence for intracranial hemorrhage on corresponding sequences. Correlation with dedicated head CT suggested to ensure no underlying small parenchymal bleed at this location. 3. Underlying mild-to-moderate chronic microvascular ischemic disease, stable.   MRA HEAD IMPRESSION: 1. Sequelae of interval endovascular treatment of left MCA bifurcation aneurysm with pipeline flow diverter stent. Patent flow is seen through the stent with good perfusion of the left MCA branches distally. Persistent filling of the aneurysm at this time, stable in size measuring 4 x 4 x 2 mm. 2. Otherwise stable and normal intracranial MRA.   Will obtain head CT to r/o bleed given the single focus of susceptibility and patient is on heparin gtt.  -- Amie Portland, MD Neurologist [facility] Pager: [phone]

## 2020-12-13 NOTE — H&P (Signed)
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 female with PMHs of HTN, hypothyroid, acute pericarditis with pericardial effusion in 2015, MDD, bipolar 2 disorder, and migraine who was referred to [facility] for possible endovascular treatment options for left MCA aneurysm which was found on MRI brain/ MRA brain on 11/27/2020. Patient underwent MRI brain/MRA brain on 11/27/2020 due to history of persistent headache with remote history of head trauma per her neurologist which showed:  MRI HEAD IMPRESSION:   1. No acute intracranial abnormality. 2. Mild-to-moderate chronic microvascular ischemic disease for age. 3. Subcentimeter remote lacunar infarct involving the inferior left cerebellum.   MRA HEAD IMPRESSION:   1. 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 This would likely be amenable to endovascular therapy. 2. Otherwise normal intracranial MRA.  Patient was referred to Dr. Estanislado Pandy after the MRI/MRA for possible endovascular treatment option for the left MCA aneurysm, Dr. Estanislado Pandy met with the patient on 11/29/2020.  During the consultation visit, patient was informed that it was unclear that the headache is related to the intracranial aneurysm on the left side.   The risk and benefit of endovascular  treatment for the intracranial aneurysm was discussed thoroughly with the patient, and after thorough discussion and shared decision making, patient decided to proceed with the endovascular treatment for the brain aneurysm.  Patient was instructed to start on the DAPT for 10 days prior to the procedure, she presents to [place] today for the procedure.  Patient laying in bed, not in acute  distress.  Denise headache, blurry vision, fever, chills, shortness of breath, cough, chest pain, abdominal pain, nausea ,vomiting, and bleeding.   Past Medical History:  Diagnosis Date   Acute pericarditis 12/2013   Bipolar II disorder    Possible diagnosis   Complication of anesthesia    Effects lasted for 3 days   Dyslipidemia    Generalized anxiety disorder with panic attacks    Hx of lithium toxicity    Hx of migraines    On propranolol.   Hypertension    Hypothyroid    Major depressive disorder 04/16/2013   Obstructive sleep apnea    Currently untreatred; oral device caused TMJ; unsuccessful with CPAP   Pericardial effusion 01/03/2014   Moderate without hemodynamic compromise.   Sinus tachycardia     Past Surgical History:  Procedure Laterality Date   BREAST SURGERY     CERVICAL FUSION     CHOLECYSTECTOMY     COLONOSCOPY WITH PROPOFOL N/A 01/16/2018   Procedure: COLONOSCOPY WITH PROPOFOL;  Surgeon: Rogene Houston, MD;  Location: [facility];  Service: Endoscopy;  Laterality: N/A;  10:55   IR RADIOLOGIST EVAL & MGMT  11/30/2020   NASAL SINUS SURGERY     POLYPECTOMY  01/16/2018   Procedure: POLYPECTOMY;  Surgeon: Rogene Houston, MD;  Location: [facility] SUITE;  Service: Endoscopy;;  colon   VAGINAL HYSTERECTOMY      Allergies: Other, Sulfa antibiotics, Sunscreens, Lamictal [lamotrigine], Seroquel [quetiapine], and Tape  Medications: Prior to Admission medications   Medication Sig Start Date End Date Taking? Authorizing Provider  ALPRAZolam (XANAX) 0.25 MG tablet Take 0.25 mg by mouth 4 (four) times daily  as needed for anxiety.     [provider]  aspirin EC 81 MG tablet Take 81 mg by mouth daily. Swallow whole.    [provider]  Baclofen 5 MG TABS Take 5 mg by mouth 3 (three) times daily. Patient taking differently: Take 5 mg by mouth in the morning and at bedtime. 11/07/20   Pieter Partridge, DO  carboxymethylcellulose (REFRESH PLUS) 0.5 %  SOLN Place 1 drop into both eyes 3 (three) times daily as needed (dry eyes).    [provider]  clopidogrel (PLAVIX) 75 MG tablet Take 75 mg by mouth daily.    [provider]  dicyclomine (BENTYL) 10 MG capsule Take 1 capsule (10 mg total) by mouth 3 (three) times daily before meals. Patient taking differently: Take 10 mg by mouth 3 (three) times daily as needed for spasms. 11/28/17   Setzer, Rona Ravens, NP  diphenhydrAMINE (BENADRYL) 25 MG tablet Take 25 mg by mouth daily as needed for allergies.    [provider]  DULoxetine (CYMBALTA) 60 MG capsule Take 60 mg by mouth daily. 08/29/20   [provider]  EPIPEN 2-PAK 0.3 MG/0.3ML SOAJ injection Inject 0.3 mg into the muscle as needed for anaphylaxis. 09/11/15   [provider]  Eyelid Cleansers (AVENOVA) 0.01 % SOLN Apply 1 application topically 2 (two) times daily as needed (eyelid cleanser).    [provider]  levothyroxine (SYNTHROID, LEVOTHROID) 75 MCG tablet Take 75 mcg by mouth daily before breakfast.    [provider]  omeprazole (PRILOSEC) 20 MG capsule Take 20 mg by mouth daily.    [provider]  ondansetron (ZOFRAN ODT) 4 MG disintegrating tablet Take 1 tablet (4 mg total) by mouth every 8 (eight) hours as needed for nausea or vomiting. 07/26/20   Tomi Likens, Adam R, DO  pregabalin (LYRICA) 150 MG capsule Take 1 capsule (150 mg total) by mouth 2 (two) times daily. 07/27/20   Pieter Partridge, DO  propranolol (INDERAL) 60 MG tablet Take 60 mg by mouth daily.    [provider]  rizatriptan (MAXALT-MLT) 10 MG disintegrating tablet Take 10 mg by mouth as needed for migraine. May repeat in 2 hours if needed    [provider]  valsartan (DIOVAN) 80 MG tablet Take 80 mg by mouth daily. 06/15/20   [provider]     Family History  Problem Relation Age of Onset   Depression Mother    Depression Father    Depression Sister    Anxiety disorder Sister     Alcohol abuse Maternal Uncle    Alcohol abuse Paternal Uncle    Alcohol abuse Maternal Grandfather    Depression Maternal Grandmother     Social History   Socioeconomic History   Marital status: Divorced    Spouse name: Not on file   Number of children: 0   Years of education: 12   Highest education level: Some college, no degree  Occupational History   Occupation: Disability  Tobacco Use   Smoking status: Former    Packs/day: 1.00    Years: 30.00    Pack years: 30.00    Types: Cigarettes    Start date: 05/13/1975    Quit date: 07/07/2012    Years since quitting: 8.4   Smokeless tobacco: Never  Vaping Use   Vaping Use: Never used  Substance and Sexual Activity   Alcohol use: Not Currently    Alcohol/week: 3.0 standard drinks    Types:  3 Glasses of wine per week   Drug use: No   Sexual activity: Not Currently    Partners: Male    Birth control/protection: Surgical  Other Topics Concern   Not on file  Social History Narrative   She lives alone. One story home she has no children   Has some college education   Right handed   Social Determinants of Health   Financial Resource Strain: Not on file  Food Insecurity: Not on file  Transportation Needs: Not on file  Physical Activity: Not on file  Stress: Not on file  Social Connections: Not on file     Review of Systems: A 12 point ROS discussed and pertinent positives are indicated in the HPI above.  All other systems are negative.  Vital Signs: There were no vitals taken for this visit.   Physical Exam Vitals and nursing note reviewed.  Constitutional:      General: She is not in acute distress.    Appearance: Normal appearance. She is not ill-appearing.  HENT:     Head: Normocephalic and atraumatic.     Mouth/Throat:     Mouth: Mucous membranes are moist.  Eyes:     Extraocular Movements: Extraocular movements intact.     Pupils: Pupils are equal, round, and reactive to light.  Cardiovascular:     Rate  and Rhythm: Normal rate and regular rhythm.     Pulses: Normal pulses.     Heart sounds: Normal heart sounds.  Pulmonary:     Effort: Pulmonary effort is normal.     Breath sounds: Normal breath sounds.  Abdominal:     General: Abdomen is flat. Bowel sounds are normal.     Palpations: Abdomen is soft.  Musculoskeletal:     Cervical back: Neck supple.  Skin:    General: Skin is warm and dry.     Coloration: Skin is not jaundiced or pale.  Neurological:     Mental Status: She is alert.     Comments: Alert, awake, and oriented x 4  Speech and comprehension intact PERRL  EOMs intact, nystagmus or subjective diplopia. no facial asymmetry. Tongue midline  Motor power intact x 4 No pronator drift. Fine motor and coordination intact Distal pulses 1+ bilaterally    Psychiatric:        Mood and Affect: Mood normal.        Behavior: Behavior normal.        Judgment: Judgment normal.       Imaging: MR ANGIO HEAD WO CONTRAST  Result Date: 11/27/2020 CLINICAL DATA:  Initial evaluation for headaches, new or worsening. Remote history of head trauma. EXAM: MRI HEAD WITHOUT AND WITH CONTRAST MRA HEAD WITHOUT CONTRAST TECHNIQUE: Multiplanar, multi-echo pulse sequences of the brain and surrounding structures were acquired without and with intravenous contrast. Angiographic images of the Circle of Willis were acquired using MRA technique without intravenous contrast. CONTRAST:  48m MULTIHANCE GADOBENATE DIMEGLUMINE 529 MG/ML IV SOLN COMPARISON:  CT from 05/02/2018. FINDINGS: MRI HEAD FINDINGS Brain: Cerebral volume within normal limits for age. Scattered patchy T2/FLAIR hyperintensity within the periventricular and deep white matter both cerebral hemispheres as well as the pons, nonspecific, but most likely related to chronic microvascular ischemic disease. Overall, appearance is mild to moderate in nature. Probable subcentimeter remote lacunar infarct at the inferior left cerebellum (series 12,  image 8). No abnormal foci of restricted diffusion to suggest acute or subacute ischemia. Gray-white matter differentiation otherwise maintained. No other areas of chronic  cortical infarction. No foci of susceptibility artifact to suggest acute or chronic intracranial hemorrhage. No mass lesion, midline shift or mass effect. Ventricles normal size without hydrocephalus. No extra-axial fluid collection. Pituitary gland and suprasellar region within normal limits. Midline structures intact and normal. No abnormal enhancement. Vascular: Major intracranial vascular flow voids are maintained. Skull and upper cervical spine: Craniocervical junction within normal limits. Degenerative osteoarthritic changes noted about the C1-2 articulation. Bone marrow signal intensity normal. No scalp soft tissue abnormality. Sinuses/Orbits: Prior bilateral ocular lens replacement. Globes and orbital soft tissues demonstrate no acute finding. Mild scattered mucosal thickening noted within the ethmoidal air cells. Paranasal sinuses are otherwise clear. No mastoid effusion. Inner ear structures grossly normal. Other: None. MRA HEAD FINDINGS Anterior circulation: Visualized distal cervical segments of the internal carotid arteries are patent with antegrade flow. Petrous, cavernous, and supraclinoid segments widely patent without stenosis or other abnormality. Origin of the ophthalmic arteries patent and normal. A1 segments widely patent. Normal anterior communicating artery complex. Anterior cerebral arteries patent to their distal aspects without stenosis. No M1 stenosis or occlusion. Normal right MCA bifurcation. On the left, there is a saccular aneurysm extending laterally and slightly inferiorly from the left MCA bifurcation (series 12, image 12). This measures 4 x 4 x 2 mm. Distal MCA branches well perfused and symmetric. Posterior circulation: Both vertebral arteries widely patent to the vertebrobasilar junction. Left vertebral artery  dominant. Right PICA origin patent and normal. Left PICA not seen. Basilar widely patent to its distal aspect without stenosis. Superior cerebellar arteries patent bilaterally. Both PCAs primarily supplied via the basilar and are well perfused to their distal aspects. Anatomic variants: Dominant left vertebral artery. IMPRESSION: MRI HEAD IMPRESSION: 1. No acute intracranial abnormality. 2. Mild-to-moderate chronic microvascular ischemic disease for age. 3. Subcentimeter remote lacunar infarct involving the inferior left cerebellum. MRA HEAD IMPRESSION: 1. 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 This would likely be amenable to endovascular therapy. 2. Otherwise normal intracranial MRA. Electronically Signed   By: Jeannine Boga M.D.   On: 11/27/2020 03:00   MR BRAIN W WO CONTRAST  Result Date: 11/27/2020 CLINICAL DATA:  Initial evaluation for headaches, new or worsening. Remote history of head trauma. EXAM: MRI HEAD WITHOUT AND WITH CONTRAST MRA HEAD WITHOUT CONTRAST TECHNIQUE: Multiplanar, multi-echo pulse sequences of the brain and surrounding structures were acquired without and with intravenous contrast. Angiographic images of the Circle of Willis were acquired using MRA technique without intravenous contrast. CONTRAST:  32m MULTIHANCE GADOBENATE DIMEGLUMINE 529 MG/ML IV SOLN COMPARISON:  CT from 05/02/2018. FINDINGS: MRI HEAD FINDINGS Brain: Cerebral volume within normal limits for age. Scattered patchy T2/FLAIR hyperintensity within the periventricular and deep white matter both cerebral hemispheres as well as the pons, nonspecific, but most likely related to chronic microvascular ischemic disease. Overall, appearance is mild to moderate in nature. Probable subcentimeter remote lacunar infarct at the inferior left cerebellum (series 12, image 8). No abnormal foci of restricted diffusion to suggest acute or subacute ischemia. Gray-white matter differentiation otherwise maintained. No other areas  of chronic cortical infarction. No foci of susceptibility artifact to suggest acute or chronic intracranial hemorrhage. No mass lesion, midline shift or mass effect. Ventricles normal size without hydrocephalus. No extra-axial fluid collection. Pituitary gland and suprasellar region within normal limits. Midline structures intact and normal. No abnormal enhancement. Vascular: Major intracranial vascular flow voids are maintained. Skull and upper cervical spine: Craniocervical junction within normal limits. Degenerative osteoarthritic changes noted about the C1-2 articulation.  Bone marrow signal intensity normal. No scalp soft tissue abnormality. Sinuses/Orbits: Prior bilateral ocular lens replacement. Globes and orbital soft tissues demonstrate no acute finding. Mild scattered mucosal thickening noted within the ethmoidal air cells. Paranasal sinuses are otherwise clear. No mastoid effusion. Inner ear structures grossly normal. Other: None. MRA HEAD FINDINGS Anterior circulation: Visualized distal cervical segments of the internal carotid arteries are patent with antegrade flow. Petrous, cavernous, and supraclinoid segments widely patent without stenosis or other abnormality. Origin of the ophthalmic arteries patent and normal. A1 segments widely patent. Normal anterior communicating artery complex. Anterior cerebral arteries patent to their distal aspects without stenosis. No M1 stenosis or occlusion. Normal right MCA bifurcation. On the left, there is a saccular aneurysm extending laterally and slightly inferiorly from the left MCA bifurcation (series 12, image 12). This measures 4 x 4 x 2 mm. Distal MCA branches well perfused and symmetric. Posterior circulation: Both vertebral arteries widely patent to the vertebrobasilar junction. Left vertebral artery dominant. Right PICA origin patent and normal. Left PICA not seen. Basilar widely patent to its distal aspect without stenosis. Superior cerebellar arteries  patent bilaterally. Both PCAs primarily supplied via the basilar and are well perfused to their distal aspects. Anatomic variants: Dominant left vertebral artery. IMPRESSION: MRI HEAD IMPRESSION: 1. No acute intracranial abnormality. 2. Mild-to-moderate chronic microvascular ischemic disease for age. 3. Subcentimeter remote lacunar infarct involving the inferior left cerebellum. MRA HEAD IMPRESSION: 1. 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 This would likely be amenable to endovascular therapy. 2. Otherwise normal intracranial MRA. Electronically Signed   By: Jeannine Boga M.D.   On: 11/27/2020 03:00   IR Radiologist Eval & Mgmt  Result Date: 11/30/2020 EXAM: NEW PATIENT OFFICE VISIT CHIEF COMPLAINT: Pains in the head. Headaches. Recent discovery of brain aneurysm on MRI MRA of the brain. 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 Patient is accompanied by her mother. Patient has a history of irritable bowel syndrome, migraine headaches, depression, anxiety, bipolar disorder, obstructive sleep apnea, restless leg syndrome and pain in the head. On close questioning, the patient reports at least 3 types of headaches. She reports a long history of migraine headaches which are proceeded by an aura with nausea and dizziness which proceeds to severe throbbing pain, can be bilateral. The pain lasts from 1-2 hours to may last a whole day. This is severe throbbing pain associated with photophobia and inability to tolerate noises. She has to cover her head and lie in a dark room. She reports having had recent increase in the frequency and duration of these headaches having experienced four these in the past 3 and half weeks. Patient also reports left parasagittal headache in the frontal region which is more like a dull  aching pain occasionally throbbing though not associated with nausea, vomiting or photophobia. This is usually responds to over-the-counter analgesics such as Tylenol. Recently she has also noticed a pain in the left parasagittal region extending into the left frontal region which is an initial sharp shooting, stabbing pain which lasts for a few minutes and radiates into the left orbit associated with tearing. None of these headaches are associated with loss of speech, blindness, double vision, loss of awareness, or seizure-like activity. Recent change in the headaches prompted a MRI MRA of the brain which revealed a left middle  cerebral artery bifurcation, 4 mm x 4 mm x 2 mm saccular aneurysm. There is no family history of intracranial aneurysms or of abdominal aneurysms. Past Medical History: Diagnosis * : Date . * : Acute pericarditis * : 12/2013 . * : Bipolar II disorder * : * : Possible diagnosis . * : Complication of anesthesia * : * : Effects lasted for 3 days . * : Dyslipidemia * : . * : Generalized anxiety disorder with panic attacks * : . * : Hx of lithium toxicity * : . * : Hx of migraines * : * : On propranolol. . * : Hypothyroid * : . * : Major depressive disorder * : 04/16/2013 . * : Obstructive sleep apnea * : * : Currently untreatred; oral device caused TMJ; unsuccessful with CPAP . * : Pericardial effusion * : 01/03/2014 * : Moderate without hemodynamic compromise. . * : Sinus tachycardia * : MEDICATIONS: Current Outpatient Medications on File Prior to Visit Medication * : Sig * : Dispense * : Refill . * : ALPRAZolam (XANAX) 0.25 MG tablet * : Take 0.25 mg by mouth 4 (four) times daily as needed for anxiety. * : * : . * : Baclofen 5 MG TABS * : take 84m at bedtime for one week, then 537mtwice daily for one week, then 7m2mhree times daily. * : 90 tablet * : 3 . * : betamethasone dipropionate 0.05 % cream * : Apply topically 2 (two) times daily. * : 30 g * : 2 . * : citalopram (CELEXA) 40 MG tablet * :  Take 40 mg by mouth daily. * : * : . * : diclofenac sodium (VOLTAREN) 1 % GEL * : Apply 2 g topically 3 (three) times daily as needed (PRN for arthritis pain in hands, knees and shoulder-uses about 1 tube/month). * : * : . * : dicyclomine (BENTYL) 10 MG capsule * : Take 1 capsule (10 mg total) by mouth 3 (three) times daily before meals. * : 90 capsule * : 4 . * : EPIPEN 2-PAK 0.3 MG/0.3ML SOAJ injection * : Inject 0.3 mg into the muscle once. * : * : 1 . * : Eyelid Cleansers (AVENOVA) 0.01 % SOLN * : Apply 1 application topically 2 (two) times daily. * : * : . * : indomethacin (INDOCIN) 25 MG capsule * : Take 1 capsule at bedtime for one week, then 1 capsule twice daily for one week, then 1 capsule three times daily * : 90 capsule * : 0 . * : indomethacin (INDOCIN) 50 MG capsule * : Take 1 capsule (50 mg total) by mouth 3 (three) times daily with meals. * : 90 capsule * : 0 . * : lamoTRIgine (LAMICTAL) 25 MG tablet * : Take by mouth at bedtime. 77m23md * : * : . * : levothyroxine (SYNTHROID, LEVOTHROID) 75 MCG tablet * : Take 75 mcg by mouth daily before breakfast. * : * : . * : Lifitegrast 5 % SOLN * : Apply 1 drop to eye 2 (two) times daily. * : * : . * : meloxicam (MOBIC) 15 MG tablet * : Take 15 mg by mouth daily. * : * : . * : olopatadine (PATANOL) 0.1 % ophthalmic solution * : Place 1 drop into both eyes 2 (two) times daily. * : * : . * : omeprazole (PRILOSEC) 20 MG capsule * : Take 20 mg by mouth daily. * : * : . * :  ondansetron (ZOFRAN ODT) 4 MG disintegrating tablet * : Take 1 tablet (4 mg total) by mouth every 8 (eight) hours as needed for nausea or vomiting. * : 20 tablet * : 5 . * : pramipexole (MIRAPEX) 0.125 MG tablet * : TAKE ONE TABLET BY MOUTH IN THE MORNING, NOON, IN THE EVENING AND AT BEDTIME * : 120 tablet * : 3 . * : pregabalin (LYRICA) 150 MG capsule * : Take 1 capsule (150 mg total) by mouth 2 (two) times daily. * : 60 capsule * : 5 . * : propranolol (INDERAL) 60 MG tablet * : Take 60 mg by  mouth daily. * : * : . * : Propylene Glycol (SYSTANE COMPLETE OP) * : Apply 1 drop to eye 4 (four) times daily. Also uses prn * : * : . * : rizatriptan (MAXALT) 10 MG tablet * : Take 10 mg by mouth as needed for migraine. May repeat in 2 hours if needed * : * : . * : valsartan (DIOVAN) 80 MG tablet * : Take 80 mg by mouth daily. * : * : ALLERGIES: Allergies Allergen * : Reactions . * : Other * : Anaphylaxis, Swelling and Other (See Comments) * : * : Walnuts . * : Sulfa Antibiotics * : Shortness Of Breath and Other (See Comments) * : * : Swelling of lips . * : Sunscreens * : Anaphylaxis, Hives and Other (See Comments) * : * : Titanium and zinc oxidesEye swelling . * : Seroquel Quetiapine * : Other (See Comments) * : * : Pychosis (foggy, uncoordinated, slurred speech) . * : Tape * : Hives FAMILY HISTORY: Family History Problem * : Relation * : Age of Onset . * : Depression * : Mother * : . * : Depression * : Father * : . * : Depression * : Sister * : . * : Anxiety disorder * : Sister * : . * : Alcohol abuse * : Maternal Uncle * : . * : Alcohol abuse * : Paternal Uncle * : . * : Alcohol abuse * : Maternal Grandfather * : . * : Depression * : Maternal Grandmother * : REVIEW OF SYSTEMS: Negative unless as mentioned above. PHYSICAL EXAMINATION: Awake, alert, oriented to time, place, space. Affect normal. Appropriate responses. Normal eye contact. Grossly no lateralizing neurological abnormalities noted. ASSESSMENT AND PLAN: Briefly the natural history of unruptured intracranial aneurysms was reviewed with the patient and her mother. They were informed that it was unclear as to whether the headaches were related to the intracranial aneurysm on the left side. The discovery of the aneurysm is probably incidental during workup for her headaches. Risk of growth and rupture of intracranial aneurysm was mentioned to be 1-2% per year. Increased risk of rupture/growth related to history of hypertension, cigarette smoking, female  gender, and a family history of ruptured or intracranial aneurysms in the family. Patient or the mother could not recollect anyone with a history of bona fide intracranial aneurysm. The potential for severe morbidity and mortality following rupture was reviewed. Management considerations were those of consideration of eliminating the aneurysm from the parent circulation via endovascular versus open clipping. The endovascular treatment options to the primary coiling versus stent assisted coiling versus placement of flow disrupted device within the aneurysm versus use of a flow diverter. As to the best treatment option endovascularly would depend on the findings on a diagnostic cerebral arteriogram. Procedure was discussed in detail. The endovascular treatment would  be under general anesthesia. This would be proceeded by diagnostic catheter arteriogram in order to determine the appropriate neuro endovascular treatment option. Risks of a thromboembolic stroke (4-5%) versus remote possibility of intra procedural rupture with attended potential fatality were reviewed. The patient would have to be started on dual antiplatelets aspirin 325 mg, and Plavix 75 mg a day 10 days prior to the scheduled procedure. A platelet inhibition test would be performed to ensure efficacy of Plavix. Patient will be switched to a different anti-platelet if found to be hypo responder to Plavix. Both the mother and the patient expressed understanding and agreement with the above management plan. They would like to proceed with the endovascular treatment option. This will be arranged as soon as possible. In the meantime, the patient was advised to maintain adequate hydration. They were encouraged to call for any concerns or questions. Electronically Signed   By: Luanne Bras M.D.   On: 11/29/2020 15:39    Labs:  CBC: Recent Labs    12/13/20 0645  WBC 6.2  HGB 13.3  HCT 39.6  PLT 225    COAGS: Recent Labs    12/13/20 0645   INR 0.9    BMP: Recent Labs    06/27/20 1349 12/13/20 0645  NA 136 139  K 4.5 3.6  CL 101 105  CO2 30 27  GLUCOSE 101* 98  BUN 20 18  CALCIUM 9.3 8.9  CREATININE 0.62 0.64  GFRNONAA  --  >60    LIVER FUNCTION TESTS: Recent Labs    06/27/20 1349  BILITOT 0.3  AST 14  ALT 14  ALKPHOS 91  PROT 6.9  ALBUMIN 4.4    TUMOR MARKERS: No results for input(s): AFPTM, CEA, CA199, CHROMGRNA in the last 8760 hours.  Assessment and Plan: 65 y.o. 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 Patient was referred to [facility] by per neurologist, and she had a consultation visit with Dr. Estanislado Pandy on 11/29/20. The risk and benefit of endovascular  treatment for the intracranial aneurysm was discussed thoroughly with the patient, and after thorough discussion and shared decision making, patient decided to proceed with the endovascular treatment for the brain aneurysm.  Patient was instructed to start on the DAPT for 10 days prior to the procedure, she presents to [facility] today for the procedure.  Npo since midnight VSS CBC all w/in normal limit  INR 0.9 BMP all w/in normal limit  P2Y12 55 on 12/05/20  COVID negative   Risks and benefits of cerebral angiogram with intervention were discussed with the patient including, but not limited to bleeding, infection, vascular injury, contrast induced renal failure, stroke or even death.  This interventional procedure involves the use of X-rays and because of the nature of the planned procedure, it is possible that we will have prolonged use of X-ray fluoroscopy.  Potential radiation risks to you include (but are not limited to) the following: - A slightly elevated risk for cancer  several years later in life. This risk is typically less than 0.5% percent. This risk is low in comparison to the normal incidence of human cancer, which is 33% for women and 50% for men according to the [facility]. - Radiation induced injury can include skin redness, resembling a rash, tissue breakdown / ulcers and hair loss (which can be temporary or permanent).   The likelihood of either of these occurring depends on the difficulty  of the procedure and whether you are sensitive to radiation due to previous procedures, disease, or genetic conditions.   IF your procedure requires a prolonged use of radiation, you will be notified and given written instructions for further action.  It is your responsibility to monitor the irradiated area for the 2 weeks following the procedure and to notify your physician if you are concerned that you have suffered a radiation induced injury.    All of the patient's questions were answered, patient is agreeable to proceed.  Consent signed and in chart.   Thank you for this interesting consult.  I greatly enjoyed meeting KAYDIN LABO and look forward to participating in their care.  A copy of this report was sent to the requesting provider on this date.  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

## 2020-12-13 NOTE — Sedation Documentation (Signed)
After procedural drape removed noticed bleeding right lateral thigh (not the groin site) was bleeding and has surrounding hematoma. MD came and looked at site and marked the hematoma.  Groin site remains level 0.

## 2020-12-13 NOTE — H&P (Deleted)
  The note originally documented on this encounter has been moved the the encounter in which it belongs.  

## 2020-12-13 NOTE — Progress Notes (Signed)
Pt developed HA of 7/10 pain, and MD was notified that groin site was still oozing.  Deveshwar gave verbal orders  for '650mg'$  Tylenol, one time dose of '30mg'$  IV Toradol, Ice chips, and not to turn Heparin off.    RN will continue to monitor.

## 2020-12-13 NOTE — Transfer of Care (Signed)
Immediate Anesthesia Transfer of Care Note  Patient: Teresa Galloway  Procedure(s) Performed: IR WITH ANESTHESIA EBOLIZATION  Patient Location: PACU  Anesthesia Type:General  Level of Consciousness: awake, alert , oriented, patient cooperative and responds to stimulation  Airway & Oxygen Therapy: Patient Spontanous Breathing  Post-op Assessment: Report given to RN, Post -op Vital signs reviewed and stable and Patient moving all extremities X 4  Post vital signs: Reviewed and stable  Last Vitals:  Vitals Value Taken Time  BP 112/57 12/13/20 1318  Temp    Pulse 79 12/13/20 1320  Resp 18 12/13/20 1320  SpO2 95 % 12/13/20 1320  Vitals shown include unvalidated device data.  Last Pain:  Vitals:   12/13/20 0711  TempSrc:   PainSc: 0-No pain         Complications: No notable events documented.

## 2020-12-13 NOTE — Progress Notes (Signed)
Patient ID: Teresa Galloway, female   DOB: [birth year], 65 y.o.   MRN: [id number] INR. Patient seen to evaluate Rt groin puncture site and bleeding from  the RT upper thigh puncture site. IV heparin stopped for an hour  and pressure held over the thigh puncture site for 20 mins. O/E  VS BP 115/70  HR 80s SR  PAO2 >95% RA. Alert ,awake  oriented to place and hospital . Denies any H/A,N/V or visual difficulties. Noted to have occasional word finding difficulties.  Minimally slurred speech. Comprehension intact.  Repetition intact.  Pupils 79m Rt = Lt. No facial droop. Tongue midline. Motor .No drift.  Power 5/5 all 2 prox and distally. Sensory to light touch intact. RT groin soft Rt upper thigh soft. Without palpable hematoma. Plan. Restarted IV heparin . Bolus IV 500cc normal saline and IV saline at 75 to 90 cc/Hr. BP 140 to 1628mg by art line. Neurology consult ASAP.(Done). MRI and MRA brain ASAP. D/W patient .  S/Lennex Pietila MD

## 2020-12-13 NOTE — Progress Notes (Signed)
Patient is s/p cerebral arteriogram followed by embolization of wide neck lobulated Lt MCA aneurysm at origin of ant temp branch with a  pipeline shield flow diverter device by Dr. Estanislado Pandy today.  Case was complicated by 6 cm x 4 cm Rt upper thigh hematoma beneath a small puncture wound.   Patient seen in PACU with Dr. Estanislado Pandy.  Reports sore throat but no other complaints.  Alert, awake, and oriented x4 Speech and comprehension intact PERRL  EOMs intact  No facial asymmetry. Tongue midline  Motor power intact x 4  Right DP dopplerable, left DP 1+ per PACU RN.  Positive pressure dressing on right CFA puncture site.  Site with TTP on 2 and 4 o'clock. Site is soft.  Otherwise unremarkable with no erythema, edema, active bleeding or drainage. Minimal amount of old, dry blood noted on the dressing. Dressing otherwise clean, dry, and intact.   Patient to be transferred to NICU for at least 24 hr observation, will be discharged tomorrow if remain stable.  Please call NIR for questions and concerns.   Armando Gang Mistie Adney PA-C 12/13/2020 3:31 PM

## 2020-12-13 NOTE — Anesthesia Procedure Notes (Signed)
Arterial Line Insertion Start/End9/10/2020 8:10 AM, 12/13/2020 8:23 AM Performed by: Glynda Jaeger, CRNA, CRNA  Preanesthetic checklist: patient identified, IV checked, site marked, risks and benefits discussed, surgical consent, monitors and equipment checked, pre-op evaluation, timeout performed and anesthesia consent Left, radial was placed Catheter size: 20 G Hand hygiene performed  and maximum sterile barriers used  Allen's test indicative of satisfactory collateral circulation Attempts: 1 Procedure performed without using ultrasound guided technique. Following insertion, dressing applied and Biopatch. Post procedure assessment: normal  Patient tolerated the procedure well with no immediate complications.

## 2020-12-13 NOTE — Procedures (Signed)
S/P 4 vessel cerebral arteriogram followed by embolization of wide neck lobulated Lt MCA aneurysm at origin of ant temp branch with a 2.75 mm x 80m pipeline shield floe diverter device. Post CT neg for hemorrhage.  Denies any H/A,N /V or visual difficulties.  Identifies 2 fingers. Pupils 338mRt = LT no facial asymmetry.  Tongue midline. Moves all 4s spontaneously and to command. RT groin soft.  Approx 6 cm x 4 cm Rt upper thigh hematoma beneath a small puncture wound. Distal pulses all present. S.Bashir Marchetti MD

## 2020-12-13 NOTE — Anesthesia Procedure Notes (Signed)
Procedure Name: Intubation Date/Time: 12/13/2020 8:51 AM Performed by: Glynda Jaeger, CRNA Pre-anesthesia Checklist: Patient identified, Patient being monitored, Timeout performed, Emergency Drugs available and Suction available Patient Re-evaluated:Patient Re-evaluated prior to induction Oxygen Delivery Method: Circle System Utilized Preoxygenation: Pre-oxygenation with 100% oxygen Induction Type: IV induction Ventilation: Mask ventilation without difficulty Laryngoscope Size: Mac and 4 Grade View: Grade II Tube type: Oral Tube size: 7.5 mm Number of attempts: 1 Airway Equipment and Method: Stylet Placement Confirmation: ETT inserted through vocal cords under direct vision, positive ETCO2 and breath sounds checked- equal and bilateral Secured at: 21 cm Tube secured with: Tape Dental Injury: Teeth and Oropharynx as per pre-operative assessment

## 2020-12-13 NOTE — Progress Notes (Signed)
Deveshwar at bedside to assess pt. New orders to resume Heparin gtt, give 562m bolus of NS, and increase BP parameters to 140-160. MRI ordered. Will continue to monitor

## 2020-12-13 NOTE — Progress Notes (Signed)
MD was called to notify that pt developed a migraine and headache pain increased to 9/10, and that groin site was still oozing.  Pressure dressing was removed and RN found puncture site in the groin and a second puncture site in the top the thigh.  Both were bleeding.  Pressure was applied and MD was notified.    MD gave orders to turn heparin off and not to resume heparin drip for a minimum of an hour and to call him back with an update at [place] before resuming drip.

## 2020-12-13 NOTE — Progress Notes (Signed)
Patient arrived to 4N ICU at 1609 from PACU. Shows, pants, and a shirt are the belongings at bedside. VSS. Patient AOx4.   Montez Hageman, RN

## 2020-12-13 NOTE — Progress Notes (Signed)
ANTICOAGULATION CONSULT NOTE  Pharmacy Consult for heparin  Indication:  s/p embolization left MCA aneurysm  Allergies  Allergen Reactions   Other Anaphylaxis, Swelling and Other (See Comments)    Walnuts or any nuts   Sulfa Antibiotics Shortness Of Breath and Other (See Comments)    Swelling of lips    Sunscreens Anaphylaxis, Hives and Other (See Comments)    Titanium and zinc oxides Eye swelling    Lamictal [Lamotrigine]     Weird Ticks    Seroquel [Quetiapine] Other (See Comments)    Pychosis (foggy, uncoordinated, slurred speech)   Tape Hives    Patient Measurements: Height: '5\' 6"'$  (167.6 cm) Weight: 60.8 kg (134 lb) IBW/kg (Calculated) : 59.3 Heparin Dosing Weight: TBW  Vital Signs: Temp: 98 F (36.7 C) (09/07 2000) Temp Source: Oral (09/07 2000) BP: 114/58 (09/07 2000) Pulse Rate: 79 (09/07 2000)  Labs: Recent Labs    12/13/20 0645 12/13/20 1813  HGB 13.3  --   HCT 39.6  --   PLT 225  --   LABPROT 12.2  --   INR 0.9  --   HEPARINUNFRC  --  0.72*  CREATININE 0.64  --      Estimated Creatinine Clearance: 65.6 mL/min (by C-G formula based on SCr of 0.64 mg/dL).   Assessment: 4 yof s/p cerebral arteriogram followed by embolization of wide neck lobulated left MCA aneurysm. Right lateral thigh hematoma noted. CBC normal on admit. Pharmacy consulted to manage IV heparin drip post-op.  Heparin level is supratherapeutic at 0.72 on 500 units/hr (8.2 units/kg/hr). Noted that heparin was stopped due to bleeding/oozing. Spoke with RN and heparin level was drawn from a-line and infusing in PIV. Unclear why heparin level is high despite low rate.  Goal of Therapy:  Heparin level 0.1-0.25 units/ml Monitor platelets by anticoagulation protocol: Yes   Plan:  Continue to hold heparin drip F/u plan and monitor for bleeding  Thank you for involving pharmacy in this patient's care.  Renold Genta, PharmD, BCPS Clinical Pharmacist Clinical phone for 12/13/2020  until 10p is [phone] 12/13/2020 8:54 PM  **Pharmacist phone directory can be found on [email or web address] listed under [place]**

## 2020-12-14 ENCOUNTER — Inpatient Hospital Stay (HOSPITAL_COMMUNITY): Payer: No Typology Code available for payment source

## 2020-12-14 ENCOUNTER — Encounter (HOSPITAL_COMMUNITY): Payer: Self-pay | Admitting: Interventional Radiology

## 2020-12-14 DIAGNOSIS — I671 Cerebral aneurysm, nonruptured: Secondary | ICD-10-CM | POA: Diagnosis not present

## 2020-12-14 DIAGNOSIS — I6340 Cerebral infarction due to embolism of unspecified cerebral artery: Secondary | ICD-10-CM | POA: Insufficient documentation

## 2020-12-14 DIAGNOSIS — I63412 Cerebral infarction due to embolism of left middle cerebral artery: Secondary | ICD-10-CM | POA: Diagnosis not present

## 2020-12-14 DIAGNOSIS — I6389 Other cerebral infarction: Secondary | ICD-10-CM | POA: Diagnosis not present

## 2020-12-14 LAB — HEMOGLOBIN A1C
Hgb A1c MFr Bld: 5.2 % (ref 4.8–5.6)
Mean Plasma Glucose: 102.54 mg/dL

## 2020-12-14 LAB — BASIC METABOLIC PANEL
Anion gap: 6 (ref 5–15)
BUN: 8 mg/dL (ref 8–23)
CO2: 24 mmol/L (ref 22–32)
Calcium: 7.8 mg/dL — ABNORMAL LOW (ref 8.9–10.3)
Chloride: 110 mmol/L (ref 98–111)
Creatinine, Ser: 0.51 mg/dL (ref 0.44–1.00)
GFR, Estimated: >60 mL/min (ref >60)
Glucose, Bld: 105 mg/dL — ABNORMAL HIGH (ref 70–99)
Potassium: 2.9 mmol/L — ABNORMAL LOW (ref 3.5–5.1)
Sodium: 140 mmol/L (ref 135–145)

## 2020-12-14 LAB — ECHOCARDIOGRAM COMPLETE
Area-P 1/2: 4.15 cm2
Calc EF: 67.8 %
Height: 66 in
S' Lateral: 2.15 cm
Single Plane A2C EF: 63.7 %
Single Plane A4C EF: 69.5 %
Weight: 2144 oz

## 2020-12-14 LAB — CBC WITH DIFFERENTIAL/PLATELET
Abs Immature Granulocytes: 0.02 10*3/uL (ref 0.00–0.07)
Basophils Absolute: 0 10*3/uL (ref 0.0–0.1)
Basophils Relative: 0 %
Eosinophils Absolute: 0 10*3/uL (ref 0.0–0.5)
Eosinophils Relative: 0 %
HCT: 30.9 % — ABNORMAL LOW (ref 36.0–46.0)
Hemoglobin: 10.4 g/dL — ABNORMAL LOW (ref 12.0–15.0)
Immature Granulocytes: 0 %
Lymphocytes Relative: 35 %
Lymphs Abs: 2.7 10*3/uL (ref 0.7–4.0)
MCH: 30.5 pg (ref 26.0–34.0)
MCHC: 33.7 g/dL (ref 30.0–36.0)
MCV: 90.6 fL (ref 80.0–100.0)
Monocytes Absolute: 0.6 10*3/uL (ref 0.1–1.0)
Monocytes Relative: 7 %
Neutro Abs: 4.3 10*3/uL (ref 1.7–7.7)
Neutrophils Relative %: 58 %
Platelets: 202 10*3/uL (ref 150–400)
RBC: 3.41 MIL/uL — ABNORMAL LOW (ref 3.87–5.11)
RDW: 13 % (ref 11.5–15.5)
WBC: 7.6 10*3/uL (ref 4.0–10.5)
nRBC: 0 % (ref 0.0–0.2)

## 2020-12-14 LAB — LIPID PANEL
Cholesterol: 179 mg/dL (ref 0–200)
HDL: 30 mg/dL — ABNORMAL LOW (ref >40)
LDL Cholesterol: 101 mg/dL — ABNORMAL HIGH (ref 0–99)
Total CHOL/HDL Ratio: 6 RATIO
Triglycerides: 242 mg/dL — ABNORMAL HIGH (ref <150)
VLDL: 48 mg/dL — ABNORMAL HIGH (ref 0–40)

## 2020-12-14 LAB — MRSA NEXT GEN BY PCR, NASAL: MRSA by PCR Next Gen: NOT DETECTED

## 2020-12-14 LAB — HEPARIN LEVEL (UNFRACTIONATED): Heparin Unfractionated: <0.1 IU/mL — ABNORMAL LOW (ref 0.30–0.70)

## 2020-12-14 LAB — GLUCOSE, CAPILLARY
Glucose-Capillary: 104 mg/dL — ABNORMAL HIGH (ref 70–99)
Glucose-Capillary: 92 mg/dL (ref 70–99)

## 2020-12-14 MED ORDER — STROKE: EARLY STAGES OF RECOVERY BOOK
Freq: Once | Status: DC
  Filled 2020-12-14: qty 1

## 2020-12-14 MED ORDER — ATORVASTATIN CALCIUM 40 MG PO TABS
40.0000 mg | ORAL_TABLET | Freq: Every day | ORAL | Status: DC
  Administered 2020-12-14 – 2020-12-22 (×9): 40 mg via ORAL
  Filled 2020-12-14 (×9): qty 1

## 2020-12-14 MED ORDER — POTASSIUM CHLORIDE CRYS ER 20 MEQ PO TBCR
20.0000 meq | EXTENDED_RELEASE_TABLET | ORAL | Status: AC
  Administered 2020-12-14 (×2): 20 meq via ORAL
  Filled 2020-12-14 (×2): qty 1

## 2020-12-14 NOTE — Progress Notes (Addendum)
STROKE TEAM PROGRESS NOTE   INTERVAL HISTORY: 66 year old female seen in room; frustration at current medical state.  Her ex husband is at the bedside providing support.   Vitals:   12/14/20 1000 12/14/20 1100 12/14/20 1200 12/14/20 1300  BP: (!) 147/65  (!) 113/52 (!) 102/43  Pulse: 71 72 66 67  Resp: '19 19 17 14  '$ Temp:   98.7 F (37.1 C)   TempSrc:   Oral   SpO2: 94% 97% 96% 98%  Weight:      Height:       CBC:  Recent Labs  Lab 12/13/20 0645 12/14/20 0642  WBC 6.2 7.6  NEUTROABS 3.1 4.3  HGB 13.3 10.4*  HCT 39.6 30.9*  MCV 91.0 90.6  PLT 225 [id number]   Basic Metabolic Panel:  Recent Labs  Lab 12/13/20 0645 12/14/20 0642  NA 139 140  K 3.6 2.9*  CL 105 110  CO2 27 24  GLUCOSE 98 105*  BUN 18 8  CREATININE 0.64 0.51  CALCIUM 8.9 7.8*    Lipid Panel:  Recent Labs  Lab 12/14/20 0642  CHOL 179  TRIG 242*  HDL 30*  CHOLHDL 6.0  VLDL 48*  LDLCALC 101*    HgbA1c:  Recent Labs  Lab 12/14/20 0642  HGBA1C 5.2   Urine Drug Screen: No results for input(s): LABOPIA, COCAINSCRNUR, LABBENZ, AMPHETMU, THCU, LABBARB in the last 168 hours.  Alcohol Level No results for input(s): ETH in the last 168 hours.  IMAGING past 24 hours CT HEAD WO CONTRAST (5MM)  Result Date: 12/14/2020 CLINICAL DATA:  Stroke, follow-up examination EXAM: CT HEAD WITHOUT CONTRAST TECHNIQUE: Contiguous axial images were obtained from the base of the skull through the vertex without intravenous contrast. COMPARISON:  MRI 12/13/2020 FINDINGS: Brain: Normal anatomic configuration. Parenchymal volume loss is commensurate with the patient's age. Mild periventricular white matter changes are present likely reflecting the sequela of small vessel ischemia. Known punctate acute infarcts within the left cerebral hemisphere are not well appreciated on this examination. No abnormal intra or extra-axial mass lesion or fluid collection. No abnormal mass effect or midline shift. No evidence of acute intracranial  hemorrhage or infarct. Ventricular size is normal. Cerebellum unremarkable. Vascular: No asymmetric hyperdense vasculature at the skull base. Skull: Intact Sinuses/Orbits: Paranasal sinuses are clear. Orbits are unremarkable. Other: Mastoid air cells and middle ear cavities are clear. IMPRESSION: No acute intracranial abnormality. Known punctate infarcts within the left cerebral hemisphere are not well appreciated on this examination. No abnormal mass effect or superimposed acute intracranial hemorrhage. Electronically Signed   By: Fidela Salisbury M.D.   On: 12/14/2020 01:25   MR ANGIO HEAD WO CONTRAST  Result Date: 12/14/2020 CLINICAL DATA:  Initial evaluation for neuro deficit, stroke suspected. 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 EXAM: MRI HEAD WITHOUT CONTRAST MRA HEAD WITHOUT CONTRAST TECHNIQUE: Multiplanar, multi-echo pulse sequences of the brain and surrounding structures were acquired without intravenous contrast. Angiographic images of the Circle of Willis were acquired using MRA technique without intravenous contrast. COMPARISON:  Comparison made with previous MRIs from 11/25/2020. FINDINGS: MRI HEAD FINDINGS Brain: Cerebral volume stable, and within normal limits for age. Patchy chronic microvascular ischemic disease involving the periventricular and deep white matter of both cerebral hemispheres as well as the pons, mild-to-moderate in nature, also unchanged. Probable small remote lacunar type infarct at the inferior left cerebellum again noted, stable. Subtle scattered diffusion abnormality seen involving the cortical gray matter of the high  left frontal lobe (series 2, image is 40, 38), with additional minimal patchy involvement of the posterior left temporal occipital region (series 2, images 21, possibly 18). Possible subtle patchy involvement of the left frontal operculum as well (series 2, image 28) findings consistent with subtle acute ischemic infarcts,  left MCA distribution, and likely embolic. No associated mass effect. No associated hemorrhage. There is a single subcentimeter focus of susceptibility artifact seen at the left frontal region (series 7, image 63), new from prior. No other visible evidence for acute intracranial hemorrhage on corresponding sequences. No mass lesion, midline shift or mass effect. No hydrocephalus or extra-axial fluid collection. Pituitary gland and suprasellar region normal. Midline structures intact. Vascular: Major intracranial vascular flow voids are maintained. Skull and upper cervical spine: Craniocervical junction within normal limits. Bone marrow signal intensity normal. No scalp soft tissue abnormality. Sinuses/Orbits: Patient status post bilateral ocular lens replacement. Left gaze noted. Mild scattered mucosal thickening noted within the sphenoid ethmoidal sinuses. Trace left mastoid effusion, of doubtful significance. Inner ear structures grossly normal. Other: None. MRA HEAD FINDINGS Anterior circulation: Visualized distal cervical segments of the internal carotid arteries are widely patent with antegrade flow. Petrous, cavernous, and supraclinoid segments widely patent without stenosis or other abnormality. A1 segments widely patent. Normal anterior communicating artery complex. Anterior cerebral arteries widely patent to their distal aspects without stenosis. Right M1 widely patent. Normal right MCA bifurcation. Distal right MCA branches remain well perfused. Interval placement of a pipeline flow diverter stent across the distal left M1 segment and left MCA bifurcation for treatment of previously identified left MCA bifurcation aneurysm. Patent flow seen through the stent, with robust perfusion of the MCA branches distally. Persistent filling of the MCA bifurcation aneurysm sac measuring 4 x 4 x 2 mm, stable from prior. Posterior circulation: Bot both V4 segments widely patent to the vertebrobasilar junction. Left  vertebral artery slightly dominant. Right PICA origin patent. Left PICA not seen. Basilar patent to its distal aspect without stenosis. Superior cerebral arteries patent bilaterally. Both PCAs primarily supplied via the basilar, although tiny bilateral posterior communicating arteries are noted. PCAs remain widely patent to their distal aspects. Anatomic variants: Dominant left vertebral artery. IMPRESSION: MRI HEAD IMPRESSION: 1. Subtle scattered small volume acute left MCA distribution infarcts as above. No associated mass effect. 2. Single new focus of susceptibility artifact involving the left frontal lobe, of uncertain significance, with no definite evidence for intracranial hemorrhage on corresponding sequences. Correlation with dedicated head CT suggested to ensure no underlying small parenchymal bleed at this location. 3. Underlying mild-to-moderate chronic microvascular ischemic disease, stable. MRA HEAD IMPRESSION: 1. Sequelae of interval endovascular treatment of left MCA bifurcation aneurysm with pipeline flow diverter stent. Patent flow is seen through the stent with good perfusion of the left MCA branches distally. Persistent filling of the aneurysm at this time, stable in size measuring 4 x 4 x 2 mm. 2. Otherwise stable and normal intracranial MRA. These results were communicated to Dr. Rory Percy at 12:32 am on 12/14/2020 by text page via the [facility] messaging system. Electronically Signed   By: Jeannine Boga M.D.   On: 12/14/2020 00:34   MR BRAIN WO CONTRAST  Result Date: 12/14/2020 CLINICAL DATA:  Initial evaluation for neuro deficit, stroke suspected. 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 EXAM: MRI HEAD WITHOUT CONTRAST MRA HEAD WITHOUT CONTRAST TECHNIQUE: Multiplanar, multi-echo pulse sequences of the brain and surrounding structures were acquired without intravenous contrast. Angiographic images of the Circle  of Willis were acquired using MRA technique  without intravenous contrast. COMPARISON:  Comparison made with previous MRIs from 11/25/2020. FINDINGS: MRI HEAD FINDINGS Brain: Cerebral volume stable, and within normal limits for age. Patchy chronic microvascular ischemic disease involving the periventricular and deep white matter of both cerebral hemispheres as well as the pons, mild-to-moderate in nature, also unchanged. Probable small remote lacunar type infarct at the inferior left cerebellum again noted, stable. Subtle scattered diffusion abnormality seen involving the cortical gray matter of the high left frontal lobe (series 2, image is 40, 38), with additional minimal patchy involvement of the posterior left temporal occipital region (series 2, images 21, possibly 18). Possible subtle patchy involvement of the left frontal operculum as well (series 2, image 28) findings consistent with subtle acute ischemic infarcts, left MCA distribution, and likely embolic. No associated mass effect. No associated hemorrhage. There is a single subcentimeter focus of susceptibility artifact seen at the left frontal region (series 7, image 63), new from prior. No other visible evidence for acute intracranial hemorrhage on corresponding sequences. No mass lesion, midline shift or mass effect. No hydrocephalus or extra-axial fluid collection. Pituitary gland and suprasellar region normal. Midline structures intact. Vascular: Major intracranial vascular flow voids are maintained. Skull and upper cervical spine: Craniocervical junction within normal limits. Bone marrow signal intensity normal. No scalp soft tissue abnormality. Sinuses/Orbits: Patient status post bilateral ocular lens replacement. Left gaze noted. Mild scattered mucosal thickening noted within the sphenoid ethmoidal sinuses. Trace left mastoid effusion, of doubtful significance. Inner ear structures grossly normal. Other: None. MRA HEAD FINDINGS Anterior circulation: Visualized distal cervical segments of the  internal carotid arteries are widely patent with antegrade flow. Petrous, cavernous, and supraclinoid segments widely patent without stenosis or other abnormality. A1 segments widely patent. Normal anterior communicating artery complex. Anterior cerebral arteries widely patent to their distal aspects without stenosis. Right M1 widely patent. Normal right MCA bifurcation. Distal right MCA branches remain well perfused. Interval placement of a pipeline flow diverter stent across the distal left M1 segment and left MCA bifurcation for treatment of previously identified left MCA bifurcation aneurysm. Patent flow seen through the stent, with robust perfusion of the MCA branches distally. Persistent filling of the MCA bifurcation aneurysm sac measuring 4 x 4 x 2 mm, stable from prior. Posterior circulation: Bot both V4 segments widely patent to the vertebrobasilar junction. Left vertebral artery slightly dominant. Right PICA origin patent. Left PICA not seen. Basilar patent to its distal aspect without stenosis. Superior cerebral arteries patent bilaterally. Both PCAs primarily supplied via the basilar, although tiny bilateral posterior communicating arteries are noted. PCAs remain widely patent to their distal aspects. Anatomic variants: Dominant left vertebral artery. IMPRESSION: MRI HEAD IMPRESSION: 1. Subtle scattered small volume acute left MCA distribution infarcts as above. No associated mass effect. 2. Single new focus of susceptibility artifact involving the left frontal lobe, of uncertain significance, with no definite evidence for intracranial hemorrhage on corresponding sequences. Correlation with dedicated head CT suggested to ensure no underlying small parenchymal bleed at this location. 3. Underlying mild-to-moderate chronic microvascular ischemic disease, stable. MRA HEAD IMPRESSION: 1. Sequelae of interval endovascular treatment of left MCA bifurcation aneurysm with pipeline flow diverter stent. Patent  flow is seen through the stent with good perfusion of the left MCA branches distally. Persistent filling of the aneurysm at this time, stable in size measuring 4 x 4 x 2 mm. 2. Otherwise stable and normal intracranial MRA. These results were communicated to Dr. Rory Percy at  12:32 am on 12/14/2020 by text page via the [facility] messaging system. Electronically Signed   By: Jeannine Boga M.D.   On: 12/14/2020 00:34    PHYSICAL EXAM  GENERAL: Awake, alert in NAD HEENT: - Normocephalic and atraumatic  LUNGS -normal respiratory effort.  CV - S1S2 RRR, no m/r/g, equal pulses bilaterally. ABDOMEN - Soft, nontender, nondistended with normoactive BS Ext: Warm well perfused.  right groin wound appears intact without obvious bleeding.  NEURO:  Mental Status: AA&Ox3  Language: speech is nondysarthric.she has  word finding difficulty but  Repetition intact, fluency intact, naming intact, comprehension intact. Cranial Nerves: PERRL. EOMI, visual fields full, no facial asymmetry, facial sensation intact, hearing intact, tongue/uvula/soft palate midline, normal sternocleidomastoid and trapezius muscle strength. No evidence of tongue atrophy or fibrillations Motor:  Right uppe and lower extremities, left upper and left lower extremity full strength 5/5. Tone: is normal and bulk is normal Sensation- Intact to light touch bilaterally, no extinction Coordination: FTN intact bilaterall Gait- deferred    ASSESSMENT/PLAN Ms. JAZARA RAYGOR is a 65 y.o. female with history of  hypertension hypothyroidism, acute pericarditis pericardial effusion 2015, MDD, bipolar disorder, migraines, left MCA aneurysm which was treated by endovascular intervention on day of admission by Dr. Estanislado Pandy, followed by some groin site bleeding complications, was noted to have word finding difficulty and neurological consultation was obtained.  According to the handoff from the providers, she was in for endovascular treatment of a 4 x 4 x 2  mm left MCA bifurcation aneurysm, which was done day of admission and was in her usual state of health prior to the procedure.  The interventionalists was called to assess the groin for increased bleeding this evening and on his examination noted her to be having subtle word finding difficulty.  This was not reported during regular neurochecks.  Unclear last known well-best estimate of last known well is around noon.   No chest pain shortness of breath nausea vomiting.  No tingling numbness or weakness anywhere reported subjectively. MRI brain showed scattered infarct at left MCA territory.   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 Sequelae of interval endovascular treatment of left MCA bifurcation aneurysm with pipeline flow diverter stent. Patent flow is seen through the stent with good perfusion of the left MCA branches distally. Persistent filling of the aneurysm at this time, stable in size measuring 4 x 4 x 2 mm. 2. Otherwise stable and normal intracranial MRA.  2D Echo : exam done and results are pending  LDL 101 HgbA1c 5.2 VTE prophylaxis -Heparin IV aspirin 81 mg daily prior to admission, now on aspirin 81 mg daily and clopidogrel 75 mg daily and heparin IV.   Therapy recommendations:  CIR Disposition: Pending  Hypertension Home meds:  propanolol '60mg'$  daily, valsartan '80mg'$  daily Stable Long-term BP goal normotensive  Hyperlipidemia Home meds:   none LDL 101, goal < 70 Add lipitor '40mg'$    High intensity statin    Continue statin at discharge  Other Stroke Risk Factors  Advanced Age >/= 56   [facility] day # 1  ATTENDING NOTE: I reviewed above note and agree with the assessment and plan. Pt was seen and examined.   65 year old female with history of hypertension, pericarditis 2015, bipolar disorder, migraine admitted yesterday for left MCA aneurysm  treatment.  Patient had left MCA aneurysm pipeline stent yesterday  with Dr. Estanislado Pandy, however found to have mild expressive aphasia postop.  MRI showed left MCA scattered infarcts, CT repeat showed no bleeding.  MRI patent left MCA pipeline stent, persistent 4x4 left MCA aneurysm.  EF 60 to 65%, A1c 5.2, LDL 101, TG 242, creatinine 0.51.  On exam, patient awake alert, lying in bed, ex-husband at bedside.  Patient orientated x3, however has mild expressive aphasia with intermittent word finding difficulty, hesitancy speech and paraphasic errors.  However able to name and repeat without difficulty.  Follows simple commands.  Facial symmetrical, tongue midline, visual field full, no gaze palsy.  Bilateral upper and lower extremity symmetrical strength except right hand decreased dexterity.  Sensation symmetrical, finger-to-nose grossly intact bilaterally.  Etiology for patient stroke likely due to procedure with minimization of left MCA.  Currently on aspirin and Plavix DAPT, will continue.  Heparin IV has been discontinued.  Continue Lipitor 40.  PT/OT recommend CIR.  For detailed assessment and plan, please refer to above as I have made changes wherever appropriate.   Neurology will sign off. Please call with questions. Pt will follow up with Dr. Tomi Likens at [facility] in about 4 weeks. Thanks for the consult.   Rosalin Hawking, MD PhD Stroke Neurology 12/14/2020 6:06 PM    To contact Stroke Continuity provider, please refer to [email or web address] After hours, contact General Neurology

## 2020-12-14 NOTE — Progress Notes (Signed)
OT Cancellation Note  Patient Details Name: NADIYAH TRAMMELL MRN: [id number] DOB: Jan 30, 1956   Cancelled Treatment:    Reason Eval/Treat Not Completed: Pain limiting ability to participate (Discussed with nsg. Will return later)  [place], OT/L   Acute OT Clinical Specialist [place] Pager [phone] Office [phone]  12/14/2020, 10:26 AM

## 2020-12-14 NOTE — TOC CAGE-AID Note (Signed)
Transition of Care [facility]) - CAGE-AID Screening   Patient Details  Name: Teresa Galloway MRN: [id number] Date of Birth: May 04, 1955  Transition of Care [facility] CM/SW Contact:    Bethann Berkshire, [place] Phone Number: 12/14/2020, 1:37 PM   Clinical Narrative:  CAGE-AID completed; score of 0. Pt reports drinking alcohol 1-2x/week with 1-2 drinks at a time. She denies any other substance use. Pt does not have concerns about her alcohol use. Substance use resources not required.    CAGE-AID Screening:    Have You Ever Felt You Ought to Cut Down on Your Drinking or Drug Use?: No Have People Annoyed You By Critizing Your Drinking Or Drug Use?: No Have You Felt Bad Or Guilty About Your Drinking Or Drug Use?: No Have You Ever Had a Drink or Used Drugs First Thing In The Morning to Steady Your Nerves or to Get Rid of a Hangover?: No CAGE-AID Score: 0  Substance Abuse Education Offered: No

## 2020-12-14 NOTE — Progress Notes (Signed)
Pt seen again this afternoon. Sitting up in chair, feeling and speaking much better. Has eaten and drinking well also.  PT/OT/ST notes and recs reviewed. CIR consult placed, called admissions coordinator but no return call yet.  Significant improvement from this am. Hopefully continues to improve and may not need inpt rehab and can be discharged home.  Encourage po intake, sitting up in chair and interacting/conversing with friend as much as possible.  Ascencion Dike PA-C Interventional Radiology 12/14/2020 3:36 PM

## 2020-12-14 NOTE — Evaluation (Signed)
Clinical/Bedside Swallow Evaluation Patient Details  Name: Teresa Galloway MRN: [id number] Date of Birth: 03-Oct-1955  Today's Date: 12/14/2020 Time: SLP Start Time (ACUTE ONLY): 1147 SLP Stop Time (ACUTE ONLY): [id number] SLP Time Calculation (min) (ACUTE ONLY): 10 min  Past Medical History:  Past Medical History:  Diagnosis Date   Acute pericarditis 12/2013   Bipolar II disorder    Possible diagnosis   Complication of anesthesia    Effects lasted for 3 days   Dyslipidemia    Generalized anxiety disorder with panic attacks    Hx of lithium toxicity    Hx of migraines    On propranolol.   Hypertension    Hypothyroid    Major depressive disorder 04/16/2013   Obstructive sleep apnea    Currently untreatred; oral device caused TMJ; unsuccessful with CPAP   Pericardial effusion 01/03/2014   Moderate without hemodynamic compromise.   Sinus tachycardia    Past Surgical History:  Past Surgical History:  Procedure Laterality Date   BREAST SURGERY     CERVICAL FUSION     CHOLECYSTECTOMY     COLONOSCOPY WITH PROPOFOL N/A 01/16/2018   Procedure: COLONOSCOPY WITH PROPOFOL;  Surgeon: Rogene Houston, MD;  Location: [facility];  Service: Endoscopy;  Laterality: N/A;  10:55   IR RADIOLOGIST EVAL & MGMT  11/30/2020   NASAL SINUS SURGERY     POLYPECTOMY  01/16/2018   Procedure: POLYPECTOMY;  Surgeon: Rogene Houston, MD;  Location: [facility];  Service: Endoscopy;;  colon   VAGINAL HYSTERECTOMY     HPI:  HPI: Teresa Galloway is a 65 y.o. female admitted for aneurysm management s/p complicated surgery with new neurologic changes.  Pt has medical history of hypertension hypothyroidism, acute pericarditis pericardial effusion 2015, MDD, bipolar disorder, migraines.  MRI 9/8: "1. Subtle scattered small volume acute left MCA distribution infarcts as above. No associated mass effect. 2. Single new focus of susceptibility artifact involving the left frontal lobe, of uncertain significance, with no  definite evidence for intracranial hemorrhage on corresponding sequences. Correlation with dedicated head CT suggested to ensure no underlying small parenchymal bleed at this location. 3. Underlying mild-to-moderate chronic microvascular ischemic disease, stable"   Assessment / Plan / Recommendation Clinical Impression  Pt presents with functional swallowing as assessed clinically. Pt tolerated all consistencies trialed with no clinical s/s of aspiration.  Pt exhibited good oral clearance of solids.  There was a double swallow noted with puree.  Pt reported prior to the administration of trials that she sometimes has sensation of food sticking at top of throat and this double swallow is consistent with baseline PO intake.  She endorsed that this was happening with puree.  Pt reports frequent heartburn and she takes reflux medication.  Suspect that any dysfunction is esophageal in nature and may be related to reflux.  If symptoms persist or become bothersome, consider GI referal to assess esophageal motility/dysphagia.  Recommend regular texture diet with thin liquids.  SLP will sign off at this time for dysphagia; pt has no further swallowing needs.   SLP Visit Diagnosis: Dysphagia, unspecified (R13.10)    Aspiration Risk  No limitations    Diet Recommendation Regular;Thin liquid   Liquid Administration via: Cup;Straw Medication Administration: Whole meds with liquid Supervision: Patient able to self feed Compensations: Slow rate;Small sips/bites Postural Changes: Seated upright at 90 degrees    Other  Recommendations Recommended Consults: Consider esophageal assessment;Consider GI evaluation Oral Care Recommendations: Oral care BID   Follow up  Recommendations None (for dysphagia)      Frequency and Duration  (N/A)          Prognosis Prognosis for Safe Diet Advancement:  (N/A)      Swallow Study   General Date of Onset: 12/13/20 HPI: HPI: Teresa Galloway is a 65 y.o. female  admitted for aneurysm management s/p complicated surgery with new neurologic changes.  Pt has medical history of hypertension hypothyroidism, acute pericarditis pericardial effusion 2015, MDD, bipolar disorder, migraines.  MRI 9/8: "1. Subtle scattered small volume acute left MCA distribution infarcts as above. No associated mass effect. 2. Single new focus of susceptibility artifact involving the left frontal lobe, of uncertain significance, with no definite evidence for intracranial hemorrhage on corresponding sequences. Correlation with dedicated head CT suggested to ensure no underlying small parenchymal bleed at this location. 3. Underlying mild-to-moderate chronic microvascular ischemic disease, stable" Type of Study: Bedside Swallow Evaluation Previous Swallow Assessment: None Diet Prior to this Study: Thin liquids Temperature Spikes Noted: No Respiratory Status: Room air History of Recent Intubation: No Behavior/Cognition: Alert;Cooperative;Pleasant mood Oral Cavity Assessment: Within Functional Limits Oral Care Completed by SLP: No Oral Cavity - Dentition: Adequate natural dentition Vision: Functional for self-feeding Self-Feeding Abilities: Able to feed self Patient Positioning: Upright in chair Baseline Vocal Quality: Normal Volitional Cough: Strong Volitional Swallow: Able to elicit    Oral/Motor/Sensory Function Overall Oral Motor/Sensory Function: Within functional limits Facial ROM: Within Functional Limits Facial Symmetry: Within Functional Limits Lingual ROM: Within Functional Limits Lingual Symmetry: Within Functional Limits Lingual Strength: Within Functional Limits Velum: Within Functional Limits Mandible: Within Functional Limits   Ice Chips Ice chips: Not tested   Thin Liquid Thin Liquid: Within functional limits Presentation: Cup;Straw    Nectar Thick Nectar Thick Liquid: Not tested   Honey Thick Honey Thick Liquid: Not tested   Puree Puree: Impaired Pharyngeal  Phase Impairments: Multiple swallows   Solid     Solid: Within functional limits Presentation: [place], [place], [place] Office: [phone]  12/14/2020,12:36 PM

## 2020-12-14 NOTE — Progress Notes (Signed)
Occupational Therapy Evaluation Patient Details Name: Teresa Galloway MRN: [id number] DOB: [birth year] Today's Date: 12/14/2020    History of Present Illness 65 yo female s/p 4-vessel cerebral arteriogram followed by embolization of wide neck lobulated L MCA aneurysm on 9/7, post-op incisional groin bleeding and word finding difficulty. 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 PMH includes acute pericarditis 2015, bipolar II disorder, anxiety, MDD.   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 Pt presents with functional decline due to significant apraxia and deficits listed below, requiring mod A with mobility and Max A with ADL tasks. Pt is an excellent candidate for CIR. Will follow acutely.    Follow Up Recommendations  CIR    Equipment Recommendations  3 in 1 bedside commode    Recommendations for Other Services Rehab consult     Precautions / Restrictions Precautions Precautions: Fall Restrictions Weight Bearing Restrictions: No      Mobility Bed Mobility Overal bed mobility: Needs Assistance Bed Mobility: Supine to Sit     Supine to sit: Min guard     General bed mobility comments: Gave instruction to exit on R side, pt went toward L. when asked to exit on R side, pt unaware of being wrapped up in blankets    Transfers Overall transfer level: Needs assistance Equipment used: 2 person hand held assist Transfers: Sit to/from Stand Sit to Stand: Min assist;+2 safety/equipment              Balance Overall balance assessment: Needs assistance Sitting-balance support: Feet supported Sitting balance-Leahy Scale: Fair       Standing balance-Leahy Scale: Poor Standing balance comment: dependent on external support                            ADL either performed or assessed with clinical judgement   ADL Overall ADL's : Needs assistance/impaired Eating/Feeding: Minimal assistance Eating/Feeding Details (indicate cue type and reason): difficulty with getting cup to mouth due to motor planning Grooming: Moderate assistance;Sitting;Cueing for sequencing   Upper Body Bathing: Moderate assistance   Lower Body Bathing: Maximal assistance;Sit to/from stand   Upper Body Dressing : Maximal assistance;Sitting   Lower Body Dressing: Maximal assistance;Sit to/from stand   Toilet Transfer: Moderate assistance;+2 for safety/equipment;Ambulation;Grab bars   Toileting- Clothing Manipulation and Hygiene: Moderate assistance Toileting - Clothing Manipulation Details (indicate cue type and reason): cues ot use non-dominant L hand; tryingto use R hand which was wrapped up in gown     Functional mobility during ADLs: Moderate assistance;+2 for safety/equipment;Cueing for safety;Cueing for sequencing       Vision Baseline Vision/History: 1 Wears glasses Patient Visual Report: Other (comment) ("flashes of light" "comes and goes on the right") Vision Assessment?: Vision impaired- to be further tested in functional context;Yes Eye Alignment: Within Functional Limits Ocular Range of Motion: Within Functional Limits Alignment/Gaze Preference: Within Defined Limits Tracking/Visual Pursuits: Decreased smoothness of horizontal tracking;Decreased smoothness of vertical tracking Saccades: Additional eye shifts occurred during testing;Additional head turns occurred during testing;Decreased speed of saccadic movement Visual Fields: Right visual field deficit Additional Comments: will further assess     Perception Perception Perception Tested?: Yes Perception Deficits: Inattention/neglect (R/L discrimnation difficulty; decreased attention to R) Spatial deficits: poor positional awareness in space Need to further  assess writing    Praxis Praxis Praxis tested?: Deficits Deficits: Initiation;Ideomotor;Perseveration;Organization;Limb apraxia Praxis-Other Comments: significant deficits with motor planning; nsg reports pt was frustrated becuae she couldn't figure out how to take the lid off of her cup and get her cup to her mouth; unable to begin brushing teeth and required mod vc to complete using non-dominat L hand; R hand going through brushing movements at one time without holding toothbrush in hand    Pertinent Vitals/Pain Pain Assessment: Faces Faces Pain Scale: Hurts little more Pain Location: headache Pain Descriptors / Indicators: Headache Pain Intervention(s): Limited activity within patient's tolerance;Patient requesting pain meds-RN notified     Hand Dominance Right   Extremity/Trunk Assessment Upper Extremity Assessment Upper Extremity Assessment: RUE deficits/detail RUE Deficits / Details: significant sensory motor deficits presenting with "alian arm"; unaware of position of arm, hitting arm against objects - at risk of injury; gross motor [facility] although difficulty with finger/thumb opposition; gross grasp/release however absent in-hand manipulation skills RUE Sensation: decreased light touch;decreased proprioception RUE Coordination: decreased fine motor;decreased gross motor   Lower Extremity Assessment Lower Extremity Assessment: Defer to PT evaluation   Cervical / Trunk Assessment Cervical / Trunk Assessment: Normal   Communication Communication Communication: Expressive difficulties (? receptive difficulties)   Cognition Arousal/Alertness: Awake/alert Behavior During Therapy: Anxious Overall Cognitive Status: Impaired/Different from baseline Area of Impairment: Attention;Memory;Following commands;Safety/judgement;Awareness;Problem solving                   Current Attention Level: Selective Memory: Decreased short-term memory Following Commands: Follows one step commands  inconsistently Safety/Judgement: Decreased awareness of safety;Decreased awareness of deficits Awareness: Intellectual Problem Solving: Slow processing;Decreased initiation;Difficulty sequencing;Requires tactile cues;Requires verbal cues     General Comments  BP supine: 147/65, BP post-gait: 127/57 with dizziness; 101/63 end of session - nsg made aware    Exercises     Shoulder Instructions      Home Living Family/patient expects to be discharged to:: Private residence Living Arrangements: Alone Available Help at Discharge: Family;Available PRN/intermittently Type of Home: Apartment Home Access: Level entry     Home Layout: One level     Bathroom Shower/Tub: Teacher, early years/pre: Standard     Home Equipment: None          Prior Functioning/Environment Level of Independence: Independent        Comments: helps take care of her mother who has pancreatic cancer        OT Problem List: Decreased strength;Decreased range of motion;Decreased activity tolerance;Impaired balance (sitting and/or standing);Impaired vision/perception;Decreased coordination;Decreased cognition;Decreased safety awareness;Decreased knowledge of use of DME or AE;Cardiopulmonary status limiting activity;Impaired sensation;Impaired UE functional use;Pain      OT Treatment/Interventions: Self-care/ADL training;Therapeutic exercise;Neuromuscular education;DME and/or AE instruction;Therapeutic activities;Cognitive remediation/compensation;Visual/perceptual remediation/compensation;Patient/family education;Balance training    OT Goals(Current goals can be found in the care plan section) Acute Rehab OT Goals Patient Stated Goal: to get better and take care of her mom OT Goal Formulation: With patient Time For Goal Achievement: 12/28/20 Potential to Achieve Goals: Good  OT Frequency: Min 2X/week   Barriers to D/C:            Co-evaluation PT/OT/SLP Co-Evaluation/Treatment:  Yes Reason for Co-Treatment: For patient/therapist safety;To address functional/ADL transfers   OT goals addressed during session: ADL's and self-care      AM-PAC OT "6 Clicks" Daily Activity     Outcome Measure Help from another person eating meals?: A Lot Help from another person taking care of personal grooming?: A Lot  Help from another person toileting, which includes using toliet, bedpan, or urinal?: A Lot Help from another person bathing (including washing, rinsing, drying)?: A Lot Help from another person to put on and taking off regular upper body clothing?: A Lot Help from another person to put on and taking off regular lower body clothing?: A Lot 6 Click Score: 12   End of Session Equipment Utilized During Treatment: Gait belt Nurse Communication: Mobility status  Activity Tolerance: Patient tolerated treatment well Patient left: in chair;with call bell/phone within reach;with chair alarm set;with family/visitor present  OT Visit Diagnosis: Unsteadiness on feet (R26.81);Other abnormalities of gait and mobility (R26.89);Apraxia (R48.2);Low vision, both eyes (H54.2);Other symptoms and signs involving cognitive function;Dizziness and giddiness (R42);Pain Pain - part of body:  (headache)                Time: [id number] OT Time Calculation (min): 38 min Charges:  OT General Charges $OT Visit: 1 Visit OT Evaluation $OT Eval Moderate Complexity: 1 Mod OT Treatments $Self Care/Home Management : 8-22 mins  Maurie Boettcher, OT/L   Acute OT Clinical Specialist Acute Rehabilitation Services Pager [phone] Office [phone]   [facility] 12/14/2020, 11:41 AM

## 2020-12-14 NOTE — Evaluation (Signed)
Physical Therapy Evaluation Patient Details Name: Teresa Galloway MRN: [id number] DOB: [birth year] Today's Date: 12/14/2020   History of Present Illness  65 yo female s/p 4-vessel cerebral arteriogram followed by embolization of wide neck lobulated L MCA aneurysm on 9/7, post-op incisional groin bleeding and word finding difficulty. 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 PMH includes acute pericarditis 2015, bipolar II disorder, anxiety, MDD.   Clinical Impression  Pt presents with impaired functional strength, R inattention with altered sensation, apraxia, impaired balance with min incoordination, and decreased activity tolerance. Pt to benefit from acute PT to address deficits. Pt ambulated to and from bathroom with min-mod +2 assist, pt demonstrating significant difficulty completing functional mobility/tasks without max cuing. PT to progress mobility as tolerated, and will continue to follow acutely.      Follow Up Recommendations CIR    Equipment Recommendations  None recommended by PT    Recommendations for Other Services       Precautions / Restrictions Precautions Precautions: Fall Restrictions Weight Bearing Restrictions: No      Mobility  Bed Mobility Overal bed mobility: Needs Assistance Bed Mobility: Supine to Sit     Supine to sit: Min guard     General bed mobility comments: Gave instruction to exit on R side, pt went toward L. when asked to exit on R side, pt unaware of being wrapped up in blankets    Transfers Overall transfer level: Needs assistance Equipment used: 2 person hand held assist Transfers: Sit to/from Stand Sit to Stand: Min assist;+2 safety/equipment         General transfer comment: assist to rise and steady  Ambulation/Gait Ambulation/Gait assistance: Min assist;Mod  assist;+2 safety/equipment Gait Distance (Feet): 12 Feet (x2 - to and from bathroom) Assistive device: 1 person hand held assist Gait Pattern/deviations: Step-through pattern;Decreased stride length;Narrow base of support;Drifts right/left Gait velocity: decr   General Gait Details: min-mod assist for steadying, guiding pt trajectory, max cuing for R attention and preventing RUE/LE bumping into objects. Pt resistant to turns towards R.  Stairs            Wheelchair Mobility    Modified Rankin (Stroke Patients Only) Modified Rankin (Stroke Patients Only) Pre-Morbid Rankin Score: No symptoms Modified Rankin: Moderately severe disability     Balance Overall balance assessment: Needs assistance Sitting-balance support: Feet supported Sitting balance-Leahy Scale: Fair       Standing balance-Leahy Scale: Poor Standing balance comment: dependent on external support                             Pertinent Vitals/Pain Pain Assessment: Faces Faces Pain Scale: Hurts little more Pain Location: headache Pain Descriptors / Indicators: Headache Pain Intervention(s): Limited activity within patient's tolerance;Monitored during session;Repositioned    Home Living Family/patient expects to be discharged to:: Private residence Living Arrangements: Alone Available Help at Discharge: Family;Available PRN/intermittently Type of Home: Apartment Home Access: Level entry     Home Layout: One level Home Equipment: None      Prior Function Level of Independence: Independent         Comments: helps take care of her mother who has pancreatic cancer     Hand Dominance   Dominant Hand: Right    Extremity/Trunk Assessment   Upper Extremity Assessment Upper Extremity Assessment: Defer to OT evaluation  RUE Deficits / Details: significant sensory motor deficits presenting with "alian arm"; unaware of position of arm, hitting arm against objects - at risk of injury; gross  motor [facility] although difficulty with finger/thumb opposition; gross grasp/release however absent in-hand manipulation skills RUE Sensation: decreased light touch;decreased proprioception RUE Coordination: decreased fine motor;decreased gross motor    Lower Extremity Assessment Lower Extremity Assessment: Overall WFL for tasks assessed (functionally incoordinated R>L, R inattention. Difficult to discern sensation L vs R given pt's difficulty discriminating R vs L)    Cervical / Trunk Assessment Cervical / Trunk Assessment: Normal  Communication   Communication: Expressive difficulties (? receptive difficulties)  Cognition Arousal/Alertness: Awake/alert Behavior During Therapy: Anxious Overall Cognitive Status: Impaired/Different from baseline Area of Impairment: Attention;Memory;Following commands;Safety/judgement;Awareness;Problem solving                   Current Attention Level: Sustained Memory: Decreased short-term memory Following Commands: Follows one step commands inconsistently Safety/Judgement: Decreased awareness of safety;Decreased awareness of deficits Awareness: Intellectual Problem Solving: Slow processing;Decreased initiation;Difficulty sequencing;Requires tactile cues;Requires verbal cues General Comments: Pt easily distractible during tasks, requires cues to attend. Apraxia noted when asked to perform functional tasks (brushing teeth, wiping after toileting), and poor L/R discrimination. Pt becomes increasingly flustered with each given tasks, requires assist to prevent frustation.      General Comments General comments (skin integrity, edema, etc.): BP supine: 147/65, BP post-gait: 127/57 with dizziness; 101/63 end of session - nsg made aware    Exercises     Assessment/Plan    PT Assessment Patient needs continued PT services  PT Problem List Decreased strength;Decreased mobility;Decreased safety awareness;Decreased activity tolerance;Decreased  balance;Decreased knowledge of use of DME;Pain;Impaired sensation       PT Treatment Interventions DME instruction;Therapeutic activities;Gait training;Therapeutic exercise;Patient/family education;Balance training;Stair training;Functional mobility training;Neuromuscular re-education    PT Goals (Current goals can be found in the Care Plan section)  Acute Rehab PT Goals Patient Stated Goal: to get better and take care of her mom PT Goal Formulation: With patient Time For Goal Achievement: 12/28/20 Potential to Achieve Goals: Good    Frequency Min 4X/week   Barriers to discharge        Co-evaluation PT/OT/SLP Co-Evaluation/Treatment: Yes Reason for Co-Treatment: For patient/therapist safety;To address functional/ADL transfers PT goals addressed during session: Mobility/safety with mobility;Balance OT goals addressed during session: ADL's and self-care       AM-PAC PT "6 Clicks" Mobility  Outcome Measure Help needed turning from your back to your side while in a flat bed without using bedrails?: A Little Help needed moving from lying on your back to sitting on the side of a flat bed without using bedrails?: A Little Help needed moving to and from a bed to a chair (including a wheelchair)?: A Lot Help needed standing up from a chair using your arms (e.g., wheelchair or bedside chair)?: A Little Help needed to walk in hospital room?: A Lot Help needed climbing 3-5 steps with a railing? : Total 6 Click Score: 14    End of Session Equipment Utilized During Treatment: Gait belt Activity Tolerance: Patient tolerated treatment well Patient left: in chair;with chair alarm set;with call bell/phone within reach;with family/visitor present Nurse Communication: Mobility status PT Visit Diagnosis: Other abnormalities of gait and mobility (R26.89);Other symptoms and signs involving the nervous system (R29.898)    Time: [id number] PT Time Calculation (min) (ACUTE ONLY): 38  min   Charges:   PT Evaluation $PT Eval Moderate Complexity: 1 Mod  Stacie Glaze, PT DPT Acute Rehabilitation Services Pager [phone]  Office [phone]   Louis Matte 12/14/2020, 2:08 PM

## 2020-12-14 NOTE — Evaluation (Signed)
Speech Language Pathology Evaluation Patient Details Name: Teresa Galloway MRN: [id number] DOB: 10/31/1955 Today's Date: 12/14/2020 Time: [id number] SLP Time Calculation (min) (ACUTE ONLY): 27 min  Problem List:  Patient Active Problem List   Diagnosis Date Noted   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   Brain aneurysm 12/13/2020   Bipolar II disorder ([place])    Obstructive sleep apnea    Hx of lithium toxicity    Irritable bowel syndrome 11/28/2017   Dyspareunia due to medical condition in female patient 12/19/2015   Atrophic vaginitis 12/19/2015   Pain in the chest    Acute pericarditis    Sinus tachycardia    Pericardial effusion, acute 01/03/2014   Hypothyroid    Hx of migraines    Generalized anxiety disorder with panic attacks    Dyslipidemia    Major depressive disorder 04/16/2013   Past Medical History:  Past Medical History:  Diagnosis Date   Acute pericarditis 12/2013   Bipolar II disorder    Possible diagnosis   Complication of anesthesia    Effects lasted for 3 days   Dyslipidemia    Generalized anxiety disorder with panic attacks    Hx of lithium toxicity    Hx of migraines    On propranolol.   Hypertension    Hypothyroid    Major depressive disorder 04/16/2013   Obstructive sleep apnea    Currently untreatred; oral device caused TMJ; unsuccessful with CPAP   Pericardial effusion 01/03/2014   Moderate without hemodynamic compromise.   Sinus tachycardia    Past Surgical History:  Past Surgical History:  Procedure Laterality Date   BREAST SURGERY     CERVICAL FUSION     CHOLECYSTECTOMY     COLONOSCOPY WITH PROPOFOL N/A 01/16/2018   Procedure: COLONOSCOPY WITH PROPOFOL;  Surgeon: Rogene Houston, MD;  Location: [facility];  Service: Endoscopy;  Laterality: N/A;  10:55   IR RADIOLOGIST EVAL & MGMT  11/30/2020   NASAL SINUS SURGERY     POLYPECTOMY  01/16/2018   Procedure: POLYPECTOMY;  Surgeon: Rogene Houston, MD;  Location: [facility];  Service: Endoscopy;;  colon   VAGINAL HYSTERECTOMY     HPI:  HPI: Teresa Galloway is a 65 y.o. female admitted for aneurysm management s/p complicated surgery with new neurologic changes.  Pt has medical history of hypertension hypothyroidism, acute pericarditis pericardial effusion 2015, MDD, bipolar disorder, migraines.  MRI 9/8: "1. Subtle scattered small volume acute left MCA distribution infarcts as above. No associated mass effect. 2. Single new focus of susceptibility artifact involving the left frontal lobe, of uncertain significance, with no definite evidence for intracranial hemorrhage on corresponding sequences. Correlation with dedicated head CT suggested to ensure no underlying small parenchymal bleed at this location. 3. Underlying mild-to-moderate chronic microvascular ischemic disease, stable"   Assessment / Plan / Recommendation Clinical Impression  Pt presents with moderate receptive > expressive communication deficits. Pt was assessed using the Western Aphasia Battery - Bedside Form (see below for additional information).  Pt was nearly 100% accurate with confrontational naming, but resposes took a bit of time and pt verbalized difficulty coming up with response ("It's on the tip of my tongue").  Pt was handed object and asked to describe it, but was able to provide correct answer independently.  Pt reports that she feels her expressive communication has improved.  With receptive language there were was significant difficulty following commands.  This seems to be impacted in part by R inattention  and difficulty with visual scanning.  Pt reports it is hard to see on R side.  Pt benefited from cues and breaking commands in to steps.  She felt that multistep command were too much information.  Pt performed well on y/n questions, but exhibited significantly more difficulty with comarative yes/no questions than with other items.    Pt was unable to write her name, she is R handed and  was attempting to use her L hand, but there was no appreciable attempt at correct letter formation.  Pt was able to read short commands (3 words) with effort and was noted to add extra words at beginning of sentance before reading command.  Pt was noted to perseverate or carry over portions of prior command when moving to next written item.  Pt benefited from visual cuing follow target sentence across paper. Pt was unable to integrate the information which she read aloud with the execution of the command and had to have moderate-maximal cuing to compete the command.   Pt's speech was clear and intelligible.  She was noted to grope with words infrequently and was able to self-correct these types of errors in the moment, suggesting possible very mild apraxia.   Pt overall is aware of errors and expresses frustration; although she cannot always identify what is wrong, she can recognize that something incorrect.  She is eager and motivated to improve and will likely be an excellent therapy candidate. Recommend ST while in house to adress the above noted communication deficits, with on-going assessment of higher level cognitive functions as indicated.  Pt will need ST at next level of care.    Western Aphasia Battery - Bedside Form Spontaneous Speech Content: 9/10 Spontaneous Speech Fluency: 9/10 Yes/No Questions: 8/10 Sequential Commands: 1/10 Repetition: 10/10 Naming: 9.5/10 Bedside Aphasia Score: 77.5/100  Reading: 2/10 Writing: 0/10 Bedside Language Score: 61/100    SLP Assessment  SLP Recommendation/Assessment: Patient needs continued Speech Lanaguage Pathology Services SLP Visit Diagnosis: Aphasia (R47.01)    Follow Up Recommendations  Inpatient Rehab    Frequency and Duration min 2x/week  2 weeks      SLP Evaluation Cognition  Overall Cognitive Status: Impaired/Different from baseline Arousal/Alertness: Awake/alert Orientation Level: Oriented X4 Attention: Focused Focused  Attention: Impaired Awareness: Appears intact       Comprehension  Auditory Comprehension Overall Auditory Comprehension: Impaired Yes/No Questions: Within Functional Limits Commands: Impaired One Step Basic Commands: 25-49% accurate Two Step Basic Commands: 0-24% accurate Interfering Components: Visual impairments;Processing speed EffectiveTechniques: Extra processing time;Repetition Reading Comprehension Reading Status: Impaired Sentence Level: Impaired Interfering Components: Visual scanning;Right neglect/inattention Effective Techniques: Visual cueing    Expression Expression Primary Mode of Expression: Verbal Verbal Expression Overall Verbal Expression: Impaired Initiation: No impairment Repetition: No impairment Naming: Impairment Confrontation: Impaired (required extra processing time) Pragmatics: No impairment Written Expression Dominant Hand: Right Written Expression: Exceptions to [facility] Self Formulation Ability: Letter   Oral / Motor  Oral Motor/Sensory Function Overall Oral Motor/Sensory Function: Within functional limits Facial ROM: Within Functional Limits Facial Symmetry: Within Functional Limits Lingual ROM: Within Functional Limits Lingual Symmetry: Within Functional Limits Lingual Strength: Within Functional Limits Velum: Within Functional Limits Mandible: Within Functional Limits Motor Speech Overall Motor Speech: Appears within functional limits for tasks assessed Respiration: Within functional limits Phonation: Normal Resonance: Within functional limits Articulation: Within functional limitis Intelligibility: Intelligible Motor Planning: Impaired Motor Speech Errors: Groping for words (Self corrected)   GO  Celedonio Savage, [place], [place] Office: [phone] 12/14/2020, 12:23 PM

## 2020-12-14 NOTE — Progress Notes (Signed)
Chief Complaint: Patient was seen today for post [facility] aneurysm intervention   Supervising Physician: Luanne Bras  Patient Status: [facility] - In-pt  Subjective: S/p pipeline diverting stent across [facility] aneurysm yesterday. Procedure was technically difficult and prolonged. Seen last pm for right groin oozing/bruising but was also noted to be having some speech difficulty.CT and MRI/MRA ordered stat which only showed known small punctate infarcts on the left. The stent and all arterial supply is patent. Appreciate Neuro consult last pm.  Pt tearful this morning. A bit frustrated at speech but thinks it's better than last pm. Also emotionally concerned because her mother has health issues and is worried she won't be able to help care for her. She's also still having a little delay in comprehending some commands. And reports some visual deficits on occasion. Also reports intermittent left sided headaches as well.   Objective: Physical Exam: BP (!) 138/49   Pulse 69   Temp 98 F (36.7 C) (Oral)   Resp 14   Ht '5\' 6"'$  (1.676 m)   Wt 60.8 kg   SpO2 (!) 89%   BMI 21.63 kg/m  A&O Still some expressive aphasia Face symmetric, tongue midline PERRLA, EOMI, difficult to produce true lateral field deficits. No drift Some difficulty and ataxia following fine motor movement Strength 5/5 UE and LE (R)groin soft, NT, small ecchymosis. Feet warm   Current Facility-Administered Medications:     stroke: mapping our early stages of recovery book, , Does not apply, Once, Amie Portland, MD   0.9 %  sodium chloride infusion, , Intravenous, Continuous, Deveshwar, Sanjeev, MD, Last Rate: 90 mL/hr at 12/14/20 0850, Rate Change at 12/14/20 0850   acetaminophen (TYLENOL) tablet 650 mg, 650 mg, Oral, Q6H PRN, Luanne Bras, MD, 650 mg at 12/13/20 1719   ALPRAZolam (XANAX) tablet 0.25 mg, 0.25 mg, Oral, QID PRN, Han, Aimee H, PA-C   aspirin chewable tablet 81 mg, 81 mg, Oral, Daily  **OR** aspirin chewable tablet 81 mg, 81 mg, Per Tube, Daily, Deveshwar, Sanjeev, MD   atorvastatin (LIPITOR) tablet 40 mg, 40 mg, Oral, Daily, Rosalin Hawking, MD   baclofen (LIORESAL) tablet 5 mg, 5 mg, Oral, TID, Han, Aimee H, PA-C   Chlorhexidine Gluconate Cloth 2 % PADS 6 each, 6 each, Topical, Daily, Deveshwar, Sanjeev, MD, 6 each at 12/13/20 1708   clevidipine (CLEVIPREX) infusion 0.5 mg/mL, 0-21 mg/hr, Intravenous, Continuous, Deveshwar, Sanjeev, MD, Stopped at 12/13/20 2035   clopidogrel (PLAVIX) tablet 75 mg, 75 mg, Oral, Daily **OR** clopidogrel (PLAVIX) tablet 75 mg, 75 mg, Per Tube, Daily, Deveshwar, Sanjeev, MD   dicyclomine (BENTYL) capsule 10 mg, 10 mg, Oral, TID PRN, Han, Aimee H, PA-C   DULoxetine (CYMBALTA) DR capsule 60 mg, 60 mg, Oral, Daily, Han, Aimee H, PA-C   EPINEPHrine (EPI-PEN) injection 0.3 mg, 0.3 mg, Intramuscular, PRN, Han, Aimee H, PA-C   heparin ADULT infusion 100 units/mL (25000 units/251m), 300 Units/hr, Intravenous, Continuous, Last Rate: 3 mL/hr at 12/14/20 0200, 300 Units/hr at 12/14/20 0200 **AND** heparin per pharmacy consult, , , Until Discontinued, Wilson, FSteva Ready RPH   irbesartan (AVAPRO) tablet 75 mg, 75 mg, Oral, Daily, Han, Aimee H, PA-C   levothyroxine (SYNTHROID) tablet 75 mcg, 75 mcg, Oral, Q0600, Han, Aimee H, PA-C, 75 mcg at 12/14/20 0551   ondansetron (ZOFRAN-ODT) disintegrating tablet 4 mg, 4 mg, Oral, Q8H PRN, Han, Aimee H, PA-C   pantoprazole (PROTONIX) EC tablet 40 mg, 40 mg, Oral, Daily, Han, Aimee H, PA-C   polyvinyl alcohol (  LIQUIFILM TEARS) 1.4 % ophthalmic solution 1 drop, 1 drop, Both Eyes, TID PRN, Levada Dy, Dwayne A, RPH   pregabalin (LYRICA) capsule 150 mg, 150 mg, Oral, BID, Han, Aimee H, PA-C, 150 mg at 12/13/20 2320   propranolol (INDERAL) tablet 60 mg, 60 mg, Oral, Daily, Han, Aimee H, PA-C   SUMAtriptan (IMITREX) tablet 25 mg, 25 mg, Oral, Q2H PRN, Joselyn Glassman A, RPH, 25 mg at 12/14/20 0551  Labs: CBC Recent Labs     12/13/20 0645 12/14/20 0642  WBC 6.2 7.6  HGB 13.3 10.4*  HCT 39.6 30.9*  PLT 225 202   BMET Recent Labs    12/13/20 0645 12/14/20 0642  NA 139 140  K 3.6 2.9*  CL 105 110  CO2 27 24  GLUCOSE 98 105*  BUN 18 8  CREATININE 0.64 0.51  CALCIUM 8.9 7.8*   LFT No results for input(s): PROT, ALBUMIN, AST, ALT, ALKPHOS, BILITOT, BILIDIR, IBILI, LIPASE in the last 72 hours. PT/INR Recent Labs    12/13/20 0645  LABPROT 12.2  INR 0.9     Studies/Results: CT HEAD WO CONTRAST (5MM)  Result Date: 12/14/2020 CLINICAL DATA:  Stroke, follow-up examination EXAM: CT HEAD WITHOUT CONTRAST TECHNIQUE: Contiguous axial images were obtained from the base of the skull through the vertex without intravenous contrast. COMPARISON:  MRI 12/13/2020 FINDINGS: Brain: Normal anatomic configuration. Parenchymal volume loss is commensurate with the patient's age. Mild periventricular white matter changes are present likely reflecting the sequela of small vessel ischemia. Known punctate acute infarcts within the left cerebral hemisphere are not well appreciated on this examination. No abnormal intra or extra-axial mass lesion or fluid collection. No abnormal mass effect or midline shift. No evidence of acute intracranial hemorrhage or infarct. Ventricular size is normal. Cerebellum unremarkable. Vascular: No asymmetric hyperdense vasculature at the skull base. Skull: Intact Sinuses/Orbits: Paranasal sinuses are clear. Orbits are unremarkable. Other: Mastoid air cells and middle ear cavities are clear. IMPRESSION: No acute intracranial abnormality. Known punctate infarcts within the left cerebral hemisphere are not well appreciated on this examination. No abnormal mass effect or superimposed acute intracranial hemorrhage. Electronically Signed   By: Fidela Salisbury M.D.   On: 12/14/2020 01:25   MR ANGIO HEAD WO CONTRAST  Result Date: 12/14/2020 CLINICAL DATA:  Initial evaluation for neuro deficit, stroke suspected.  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 EXAM: MRI HEAD WITHOUT CONTRAST MRA HEAD WITHOUT CONTRAST TECHNIQUE: Multiplanar, multi-echo pulse sequences of the brain and surrounding structures were acquired without intravenous contrast. Angiographic images of the Circle of Willis were acquired using MRA technique without intravenous contrast. COMPARISON:  Comparison made with previous MRIs from 11/25/2020. FINDINGS: MRI HEAD FINDINGS Brain: Cerebral volume stable, and within normal limits for age. Patchy chronic microvascular ischemic disease involving the periventricular and deep white matter of both cerebral hemispheres as well as the pons, mild-to-moderate in nature, also unchanged. Probable small remote lacunar type infarct at the inferior left cerebellum again noted, stable. Subtle scattered diffusion abnormality seen involving the cortical gray matter of the high left frontal lobe (series 2, image is 40, 38), with additional minimal patchy involvement of the posterior left temporal occipital region (series 2, images 21, possibly 18). Possible subtle patchy involvement of the left frontal operculum as well (series 2, image 28) findings consistent with subtle acute ischemic infarcts, left MCA distribution, and likely embolic. No associated mass effect. No associated hemorrhage. There is a single subcentimeter focus of susceptibility artifact seen at the  left frontal region (series 7, image 63), new from prior. No other visible evidence for acute intracranial hemorrhage on corresponding sequences. No mass lesion, midline shift or mass effect. No hydrocephalus or extra-axial fluid collection. Pituitary gland and suprasellar region normal. Midline structures intact. Vascular: Major intracranial vascular flow voids are maintained. Skull and upper cervical spine: Craniocervical junction within normal limits. Bone marrow signal intensity normal. No scalp soft tissue abnormality.  Sinuses/Orbits: Patient status post bilateral ocular lens replacement. Left gaze noted. Mild scattered mucosal thickening noted within the sphenoid ethmoidal sinuses. Trace left mastoid effusion, of doubtful significance. Inner ear structures grossly normal. Other: None. MRA HEAD FINDINGS Anterior circulation: Visualized distal cervical segments of the internal carotid arteries are widely patent with antegrade flow. Petrous, cavernous, and supraclinoid segments widely patent without stenosis or other abnormality. A1 segments widely patent. Normal anterior communicating artery complex. Anterior cerebral arteries widely patent to their distal aspects without stenosis. Right M1 widely patent. Normal right MCA bifurcation. Distal right MCA branches remain well perfused. Interval placement of a pipeline flow diverter stent across the distal left M1 segment and left MCA bifurcation for treatment of previously identified left MCA bifurcation aneurysm. Patent flow seen through the stent, with robust perfusion of the MCA branches distally. Persistent filling of the MCA bifurcation aneurysm sac measuring 4 x 4 x 2 mm, stable from prior. Posterior circulation: Bot both V4 segments widely patent to the vertebrobasilar junction. Left vertebral artery slightly dominant. Right PICA origin patent. Left PICA not seen. Basilar patent to its distal aspect without stenosis. Superior cerebral arteries patent bilaterally. Both PCAs primarily supplied via the basilar, although tiny bilateral posterior communicating arteries are noted. PCAs remain widely patent to their distal aspects. Anatomic variants: Dominant left vertebral artery. IMPRESSION: MRI HEAD IMPRESSION: 1. Subtle scattered small volume acute left MCA distribution infarcts as above. No associated mass effect. 2. Single new focus of susceptibility artifact involving the left frontal lobe, of uncertain significance, with no definite evidence for intracranial hemorrhage on  corresponding sequences. Correlation with dedicated head CT suggested to ensure no underlying small parenchymal bleed at this location. 3. Underlying mild-to-moderate chronic microvascular ischemic disease, stable. MRA HEAD IMPRESSION: 1. Sequelae of interval endovascular treatment of left MCA bifurcation aneurysm with pipeline flow diverter stent. Patent flow is seen through the stent with good perfusion of the left MCA branches distally. Persistent filling of the aneurysm at this time, stable in size measuring 4 x 4 x 2 mm. 2. Otherwise stable and normal intracranial MRA. These results were communicated to Dr. Rory Percy at 12:32 am on 12/14/2020 by text page via the [facility] messaging system. Electronically Signed   By: Jeannine Boga M.D.   On: 12/14/2020 00:34   MR BRAIN WO CONTRAST  Result Date: 12/14/2020 CLINICAL DATA:  Initial evaluation for neuro deficit, stroke suspected. 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 EXAM: MRI HEAD WITHOUT CONTRAST MRA HEAD WITHOUT CONTRAST TECHNIQUE: Multiplanar, multi-echo pulse sequences of the brain and surrounding structures were acquired without intravenous contrast. Angiographic images of the Circle of Willis were acquired using MRA technique without intravenous contrast. COMPARISON:  Comparison made with previous MRIs from 11/25/2020. FINDINGS: MRI HEAD FINDINGS Brain: Cerebral volume stable, and within normal limits for age. Patchy chronic microvascular ischemic disease involving the periventricular and deep white matter of both cerebral hemispheres as well as the pons, mild-to-moderate in nature, also unchanged. Probable small remote lacunar type infarct at the inferior left cerebellum again noted, stable. Subtle  scattered diffusion abnormality seen involving the cortical gray matter of the high left frontal lobe (series 2, image is 40, 38), with additional minimal patchy involvement of the posterior left temporal occipital region  (series 2, images 21, possibly 18). Possible subtle patchy involvement of the left frontal operculum as well (series 2, image 28) findings consistent with subtle acute ischemic infarcts, left MCA distribution, and likely embolic. No associated mass effect. No associated hemorrhage. There is a single subcentimeter focus of susceptibility artifact seen at the left frontal region (series 7, image 63), new from prior. No other visible evidence for acute intracranial hemorrhage on corresponding sequences. No mass lesion, midline shift or mass effect. No hydrocephalus or extra-axial fluid collection. Pituitary gland and suprasellar region normal. Midline structures intact. Vascular: Major intracranial vascular flow voids are maintained. Skull and upper cervical spine: Craniocervical junction within normal limits. Bone marrow signal intensity normal. No scalp soft tissue abnormality. Sinuses/Orbits: Patient status post bilateral ocular lens replacement. Left gaze noted. Mild scattered mucosal thickening noted within the sphenoid ethmoidal sinuses. Trace left mastoid effusion, of doubtful significance. Inner ear structures grossly normal. Other: None. MRA HEAD FINDINGS Anterior circulation: Visualized distal cervical segments of the internal carotid arteries are widely patent with antegrade flow. Petrous, cavernous, and supraclinoid segments widely patent without stenosis or other abnormality. A1 segments widely patent. Normal anterior communicating artery complex. Anterior cerebral arteries widely patent to their distal aspects without stenosis. Right M1 widely patent. Normal right MCA bifurcation. Distal right MCA branches remain well perfused. Interval placement of a pipeline flow diverter stent across the distal left M1 segment and left MCA bifurcation for treatment of previously identified left MCA bifurcation aneurysm. Patent flow seen through the stent, with robust perfusion of the MCA branches distally. Persistent  filling of the MCA bifurcation aneurysm sac measuring 4 x 4 x 2 mm, stable from prior. Posterior circulation: Bot both V4 segments widely patent to the vertebrobasilar junction. Left vertebral artery slightly dominant. Right PICA origin patent. Left PICA not seen. Basilar patent to its distal aspect without stenosis. Superior cerebral arteries patent bilaterally. Both PCAs primarily supplied via the basilar, although tiny bilateral posterior communicating arteries are noted. PCAs remain widely patent to their distal aspects. Anatomic variants: Dominant left vertebral artery. IMPRESSION: MRI HEAD IMPRESSION: 1. Subtle scattered small volume acute left MCA distribution infarcts as above. No associated mass effect. 2. Single new focus of susceptibility artifact involving the left frontal lobe, of uncertain significance, with no definite evidence for intracranial hemorrhage on corresponding sequences. Correlation with dedicated head CT suggested to ensure no underlying small parenchymal bleed at this location. 3. Underlying mild-to-moderate chronic microvascular ischemic disease, stable. MRA HEAD IMPRESSION: 1. Sequelae of interval endovascular treatment of left MCA bifurcation aneurysm with pipeline flow diverter stent. Patent flow is seen through the stent with good perfusion of the left MCA branches distally. Persistent filling of the aneurysm at this time, stable in size measuring 4 x 4 x 2 mm. 2. Otherwise stable and normal intracranial MRA. These results were communicated to Dr. Rory Percy at 12:32 am on 12/14/2020 by text page via the [facility] messaging system. Electronically Signed   By: Jeannine Boga M.D.   On: 12/14/2020 00:34    Assessment/Plan: S/p embolization of wide neck lobulated Lt MCA aneurysm at origin of ant temp branch with a 2.75 mm x 2m pipeline shield floe diverter device. Post procedure aphasia, improved since last pm. Will get ST and PT to assess. DC art line Up to  chair, regular  diet. To get ASA and Plavix Family/friend at bedside. Hopefully still able to discharge later today.    LOS: 1 day   I spent a total of 30 minutes in face to face in clinical consultation, greater than 50% of which was counseling/coordinating care for (L) MCA aneurysm   Ascencion Dike PA-C 12/14/2020 8:54 AM

## 2020-12-14 NOTE — Progress Notes (Signed)
  Echocardiogram 2D Echocardiogram has been performed.  Bobbye Charleston 12/14/2020, 2:50 PM

## 2020-12-15 LAB — BASIC METABOLIC PANEL
Anion gap: 3 — ABNORMAL LOW (ref 5–15)
BUN: 7 mg/dL — ABNORMAL LOW (ref 8–23)
CO2: 25 mmol/L (ref 22–32)
Calcium: 8.2 mg/dL — ABNORMAL LOW (ref 8.9–10.3)
Chloride: 114 mmol/L — ABNORMAL HIGH (ref 98–111)
Creatinine, Ser: 0.59 mg/dL (ref 0.44–1.00)
GFR, Estimated: >60 mL/min (ref >60)
Glucose, Bld: 108 mg/dL — ABNORMAL HIGH (ref 70–99)
Potassium: 3.5 mmol/L (ref 3.5–5.1)
Sodium: 142 mmol/L (ref 135–145)

## 2020-12-15 LAB — POCT ACTIVATED CLOTTING TIME: Activated Clotting Time: 132 seconds

## 2020-12-15 NOTE — Progress Notes (Signed)
Chief Complaint: Patient was seen today for post [facility] aneurysm intervention   Supervising Physician: Luanne Bras  Patient Status: [facility] - In-pt  Subjective: Significant improvement overnight.  Still with some mild, residual deficits.  Not using right hand as dominant hand. Mild gait imbalance.  Visual disturbances.   Objective: Physical Exam: BP (!) 117/51   Pulse 76   Temp 98.6 F (37 C) (Oral)   Resp 18   Ht '5\' 6"'$  (1.676 m)   Wt 134 lb (60.8 kg)   SpO2 96%   BMI 21.63 kg/m  NAD, alert Neuro: alert, oriented, aphasia improved, face symmetric, tongue midline, some difficulty and ataxia following fine motor movement Skin: (R)groin soft, NT, small ecchymosis. Feet warm   Current Facility-Administered Medications:     stroke: mapping our early stages of recovery book, , Does not apply, Once, Amie Portland, MD   0.9 %  sodium chloride infusion, , Intravenous, Continuous, Deveshwar, Sanjeev, MD, Last Rate: 90 mL/hr at 12/15/20 1200, Infusion Verify at 12/15/20 1200   acetaminophen (TYLENOL) tablet 650 mg, 650 mg, Oral, Q6H PRN, Luanne Bras, MD, 650 mg at 12/15/20 0513   ALPRAZolam (XANAX) tablet 0.25 mg, 0.25 mg, Oral, QID PRN, Han, Aimee H, PA-C, 0.25 mg at 12/14/20 2142   aspirin chewable tablet 81 mg, 81 mg, Oral, Daily, 81 mg at 12/15/20 0923 **OR** aspirin chewable tablet 81 mg, 81 mg, Per Tube, Daily, Deveshwar, Sanjeev, MD   atorvastatin (LIPITOR) tablet 40 mg, 40 mg, Oral, Daily, Rosalin Hawking, MD, 40 mg at 12/15/20 [id number]   baclofen (LIORESAL) tablet 5 mg, 5 mg, Oral, TID, Han, Aimee H, PA-C   Chlorhexidine Gluconate Cloth 2 % PADS 6 each, 6 each, Topical, Daily, Deveshwar, Sanjeev, MD, 6 each at 12/14/20 1044   clopidogrel (PLAVIX) tablet 75 mg, 75 mg, Oral, Daily, 75 mg at 12/15/20 0923 **OR** clopidogrel (PLAVIX) tablet 75 mg, 75 mg, Per Tube, Daily, Deveshwar, Sanjeev, MD   dicyclomine (BENTYL) capsule 10 mg, 10 mg, Oral, TID PRN, Han, Aimee H, PA-C    DULoxetine (CYMBALTA) DR capsule 60 mg, 60 mg, Oral, Daily, Han, Aimee H, PA-C, 60 mg at 12/15/20 0924   EPINEPHrine (EPI-PEN) injection 0.3 mg, 0.3 mg, Intramuscular, PRN, Han, Aimee H, PA-C   irbesartan (AVAPRO) tablet 75 mg, 75 mg, Oral, Daily, Han, Aimee H, PA-C, 75 mg at 12/14/20 0902   levothyroxine (SYNTHROID) tablet 75 mcg, 75 mcg, Oral, Q0600, Han, Aimee H, PA-C, 75 mcg at 12/15/20 0511   ondansetron (ZOFRAN-ODT) disintegrating tablet 4 mg, 4 mg, Oral, Q8H PRN, Han, Aimee H, PA-C, 4 mg at 12/14/20 1028   pantoprazole (PROTONIX) EC tablet 40 mg, 40 mg, Oral, Daily, Han, Aimee H, PA-C, 40 mg at 12/15/20 [id number]   polyvinyl alcohol (LIQUIFILM TEARS) 1.4 % ophthalmic solution 1 drop, 1 drop, Both Eyes, TID PRN, Levada Dy, Dwayne A, RPH   pregabalin (LYRICA) capsule 150 mg, 150 mg, Oral, BID, Han, Aimee H, PA-C, 150 mg at 12/15/20 0924   propranolol (INDERAL) tablet 60 mg, 60 mg, Oral, Daily, Han, Aimee H, PA-C, 60 mg at 12/14/20 0902   SUMAtriptan (IMITREX) tablet 25 mg, 25 mg, Oral, Q2H PRN, Joselyn Glassman A, RPH, 25 mg at 12/14/20 2329  Labs: CBC Recent Labs    12/13/20 0645 12/14/20 0642  WBC 6.2 7.6  HGB 13.3 10.4*  HCT 39.6 30.9*  PLT 225 202   BMET Recent Labs    12/14/20 0642 12/15/20 0243  NA 140 142  K 2.9*  3.5  CL 110 114*  CO2 24 25  GLUCOSE 105* 108*  BUN 8 7*  CREATININE 0.51 0.59  CALCIUM 7.8* 8.2*   LFT No results for input(s): PROT, ALBUMIN, AST, ALT, ALKPHOS, BILITOT, BILIDIR, IBILI, LIPASE in the last 72 hours. PT/INR Recent Labs    12/13/20 0645  LABPROT 12.2  INR 0.9     Studies/Results: CT HEAD WO CONTRAST (5MM)  Result Date: 12/14/2020 CLINICAL DATA:  Stroke, follow-up examination EXAM: CT HEAD WITHOUT CONTRAST TECHNIQUE: Contiguous axial images were obtained from the base of the skull through the vertex without intravenous contrast. COMPARISON:  MRI 12/13/2020 FINDINGS: Brain: Normal anatomic configuration. Parenchymal volume loss is commensurate  with the patient's age. Mild periventricular white matter changes are present likely reflecting the sequela of small vessel ischemia. Known punctate acute infarcts within the left cerebral hemisphere are not well appreciated on this examination. No abnormal intra or extra-axial mass lesion or fluid collection. No abnormal mass effect or midline shift. No evidence of acute intracranial hemorrhage or infarct. Ventricular size is normal. Cerebellum unremarkable. Vascular: No asymmetric hyperdense vasculature at the skull base. Skull: Intact Sinuses/Orbits: Paranasal sinuses are clear. Orbits are unremarkable. Other: Mastoid air cells and middle ear cavities are clear. IMPRESSION: No acute intracranial abnormality. Known punctate infarcts within the left cerebral hemisphere are not well appreciated on this examination. No abnormal mass effect or superimposed acute intracranial hemorrhage. Electronically Signed   By: Fidela Salisbury M.D.   On: 12/14/2020 01:25   MR ANGIO HEAD WO CONTRAST  Result Date: 12/14/2020 CLINICAL DATA:  Initial evaluation for neuro deficit, stroke suspected. 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 EXAM: MRI HEAD WITHOUT CONTRAST MRA HEAD WITHOUT CONTRAST TECHNIQUE: Multiplanar, multi-echo pulse sequences of the brain and surrounding structures were acquired without intravenous contrast. Angiographic images of the Circle of Willis were acquired using MRA technique without intravenous contrast. COMPARISON:  Comparison made with previous MRIs from 11/25/2020. FINDINGS: MRI HEAD FINDINGS Brain: Cerebral volume stable, and within normal limits for age. Patchy chronic microvascular ischemic disease involving the periventricular and deep white matter of both cerebral hemispheres as well as the pons, mild-to-moderate in nature, also unchanged. Probable small remote lacunar type infarct at the inferior left cerebellum again noted, stable. Subtle scattered diffusion  abnormality seen involving the cortical gray matter of the high left frontal lobe (series 2, image is 40, 38), with additional minimal patchy involvement of the posterior left temporal occipital region (series 2, images 21, possibly 18). Possible subtle patchy involvement of the left frontal operculum as well (series 2, image 28) findings consistent with subtle acute ischemic infarcts, left MCA distribution, and likely embolic. No associated mass effect. No associated hemorrhage. There is a single subcentimeter focus of susceptibility artifact seen at the left frontal region (series 7, image 63), new from prior. No other visible evidence for acute intracranial hemorrhage on corresponding sequences. No mass lesion, midline shift or mass effect. No hydrocephalus or extra-axial fluid collection. Pituitary gland and suprasellar region normal. Midline structures intact. Vascular: Major intracranial vascular flow voids are maintained. Skull and upper cervical spine: Craniocervical junction within normal limits. Bone marrow signal intensity normal. No scalp soft tissue abnormality. Sinuses/Orbits: Patient status post bilateral ocular lens replacement. Left gaze noted. Mild scattered mucosal thickening noted within the sphenoid ethmoidal sinuses. Trace left mastoid effusion, of doubtful significance. Inner ear structures grossly normal. Other: None. MRA HEAD FINDINGS Anterior circulation: Visualized distal cervical segments of the internal carotid arteries  are widely patent with antegrade flow. Petrous, cavernous, and supraclinoid segments widely patent without stenosis or other abnormality. A1 segments widely patent. Normal anterior communicating artery complex. Anterior cerebral arteries widely patent to their distal aspects without stenosis. Right M1 widely patent. Normal right MCA bifurcation. Distal right MCA branches remain well perfused. Interval placement of a pipeline flow diverter stent across the distal left M1  segment and left MCA bifurcation for treatment of previously identified left MCA bifurcation aneurysm. Patent flow seen through the stent, with robust perfusion of the MCA branches distally. Persistent filling of the MCA bifurcation aneurysm sac measuring 4 x 4 x 2 mm, stable from prior. Posterior circulation: Bot both V4 segments widely patent to the vertebrobasilar junction. Left vertebral artery slightly dominant. Right PICA origin patent. Left PICA not seen. Basilar patent to its distal aspect without stenosis. Superior cerebral arteries patent bilaterally. Both PCAs primarily supplied via the basilar, although tiny bilateral posterior communicating arteries are noted. PCAs remain widely patent to their distal aspects. Anatomic variants: Dominant left vertebral artery. IMPRESSION: MRI HEAD IMPRESSION: 1. Subtle scattered small volume acute left MCA distribution infarcts as above. No associated mass effect. 2. Single new focus of susceptibility artifact involving the left frontal lobe, of uncertain significance, with no definite evidence for intracranial hemorrhage on corresponding sequences. Correlation with dedicated head CT suggested to ensure no underlying small parenchymal bleed at this location. 3. Underlying mild-to-moderate chronic microvascular ischemic disease, stable. MRA HEAD IMPRESSION: 1. Sequelae of interval endovascular treatment of left MCA bifurcation aneurysm with pipeline flow diverter stent. Patent flow is seen through the stent with good perfusion of the left MCA branches distally. Persistent filling of the aneurysm at this time, stable in size measuring 4 x 4 x 2 mm. 2. Otherwise stable and normal intracranial MRA. These results were communicated to Dr. Rory Percy at 12:32 am on 12/14/2020 by text page via the [facility] messaging system. Electronically Signed   By: Jeannine Boga M.D.   On: 12/14/2020 00:34   MR BRAIN WO CONTRAST  Result Date: 12/14/2020 CLINICAL DATA:  Initial evaluation for  neuro deficit, stroke suspected. 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 EXAM: MRI HEAD WITHOUT CONTRAST MRA HEAD WITHOUT CONTRAST TECHNIQUE: Multiplanar, multi-echo pulse sequences of the brain and surrounding structures were acquired without intravenous contrast. Angiographic images of the Circle of Willis were acquired using MRA technique without intravenous contrast. COMPARISON:  Comparison made with previous MRIs from 11/25/2020. FINDINGS: MRI HEAD FINDINGS Brain: Cerebral volume stable, and within normal limits for age. Patchy chronic microvascular ischemic disease involving the periventricular and deep white matter of both cerebral hemispheres as well as the pons, mild-to-moderate in nature, also unchanged. Probable small remote lacunar type infarct at the inferior left cerebellum again noted, stable. Subtle scattered diffusion abnormality seen involving the cortical gray matter of the high left frontal lobe (series 2, image is 40, 38), with additional minimal patchy involvement of the posterior left temporal occipital region (series 2, images 21, possibly 18). Possible subtle patchy involvement of the left frontal operculum as well (series 2, image 28) findings consistent with subtle acute ischemic infarcts, left MCA distribution, and likely embolic. No associated mass effect. No associated hemorrhage. There is a single subcentimeter focus of susceptibility artifact seen at the left frontal region (series 7, image 63), new from prior. No other visible evidence for acute intracranial hemorrhage on corresponding sequences. No mass lesion, midline shift or mass effect. No hydrocephalus or extra-axial fluid collection.  Pituitary gland and suprasellar region normal. Midline structures intact. Vascular: Major intracranial vascular flow voids are maintained. Skull and upper cervical spine: Craniocervical junction within normal limits. Bone marrow signal intensity normal. No  scalp soft tissue abnormality. Sinuses/Orbits: Patient status post bilateral ocular lens replacement. Left gaze noted. Mild scattered mucosal thickening noted within the sphenoid ethmoidal sinuses. Trace left mastoid effusion, of doubtful significance. Inner ear structures grossly normal. Other: None. MRA HEAD FINDINGS Anterior circulation: Visualized distal cervical segments of the internal carotid arteries are widely patent with antegrade flow. Petrous, cavernous, and supraclinoid segments widely patent without stenosis or other abnormality. A1 segments widely patent. Normal anterior communicating artery complex. Anterior cerebral arteries widely patent to their distal aspects without stenosis. Right M1 widely patent. Normal right MCA bifurcation. Distal right MCA branches remain well perfused. Interval placement of a pipeline flow diverter stent across the distal left M1 segment and left MCA bifurcation for treatment of previously identified left MCA bifurcation aneurysm. Patent flow seen through the stent, with robust perfusion of the MCA branches distally. Persistent filling of the MCA bifurcation aneurysm sac measuring 4 x 4 x 2 mm, stable from prior. Posterior circulation: Bot both V4 segments widely patent to the vertebrobasilar junction. Left vertebral artery slightly dominant. Right PICA origin patent. Left PICA not seen. Basilar patent to its distal aspect without stenosis. Superior cerebral arteries patent bilaterally. Both PCAs primarily supplied via the basilar, although tiny bilateral posterior communicating arteries are noted. PCAs remain widely patent to their distal aspects. Anatomic variants: Dominant left vertebral artery. IMPRESSION: MRI HEAD IMPRESSION: 1. Subtle scattered small volume acute left MCA distribution infarcts as above. No associated mass effect. 2. Single new focus of susceptibility artifact involving the left frontal lobe, of uncertain significance, with no definite evidence for  intracranial hemorrhage on corresponding sequences. Correlation with dedicated head CT suggested to ensure no underlying small parenchymal bleed at this location. 3. Underlying mild-to-moderate chronic microvascular ischemic disease, stable. MRA HEAD IMPRESSION: 1. Sequelae of interval endovascular treatment of left MCA bifurcation aneurysm with pipeline flow diverter stent. Patent flow is seen through the stent with good perfusion of the left MCA branches distally. Persistent filling of the aneurysm at this time, stable in size measuring 4 x 4 x 2 mm. 2. Otherwise stable and normal intracranial MRA. These results were communicated to Dr. Rory Percy at 12:32 am on 12/14/2020 by text page via the [facility] messaging system. Electronically Signed   By: Jeannine Boga M.D.   On: 12/14/2020 00:34   ECHOCARDIOGRAM COMPLETE  Result Date: 12/14/2020    ECHOCARDIOGRAM REPORT   Patient Name:   Teresa Galloway Date of Exam: 12/14/2020 Medical Rec #:  [id number]      Height:       66.0 in Accession #:    [id number]     Weight:       134.0 lb Date of Birth:  1955-09-09       BSA:          1.687 m Patient Age:    27 years       BP:           113/52 mmHg Patient Gender: F              HR:           71 bpm. Exam Location:  Inpatient Procedure: 2D Echo, 3D Echo, Cardiac Doppler and Color Doppler Indications:    Stroke  History:  Patient has prior history of Echocardiogram examinations, most                 recent 04/06/2014. Abnormal ECG, Arrythmias:Tachycardia; Risk                 Factors:Dyslipidemia, Sleep Apnea and Former Smoker. Pericardial                 effusion. Pericarditis.  Sonographer:    Roseanna Rainbow RDCS Referring Phys: [id number] ASHISH ARORA IMPRESSIONS  1. Left ventricular ejection fraction, by estimation, is 60 to 65%. The left ventricle has normal function. The left ventricle has no regional wall motion abnormalities. There is mild left ventricular hypertrophy. Left ventricular diastolic parameters were normal.  2.  Right ventricular systolic function is normal. The right ventricular size is normal. Tricuspid regurgitation signal is inadequate for assessing PA pressure.  3. The mitral valve is normal in structure. No evidence of mitral valve regurgitation. No evidence of mitral stenosis.  4. The aortic valve is tricuspid. Aortic valve regurgitation is not visualized. No aortic stenosis is present.  5. Aortic dilatation noted. There is dilatation of the ascending aorta, measuring 41 mm.  6. The inferior vena cava is normal in size with greater than 50% respiratory variability, suggesting right atrial pressure of 3 mmHg. FINDINGS  Left Ventricle: Left ventricular ejection fraction, by estimation, is 60 to 65%. The left ventricle has normal function. The left ventricle has no regional wall motion abnormalities. The left ventricular internal cavity size was normal in size. There is  mild left ventricular hypertrophy. Left ventricular diastolic parameters were normal. Right Ventricle: The right ventricular size is normal. No increase in right ventricular wall thickness. Right ventricular systolic function is normal. Tricuspid regurgitation signal is inadequate for assessing PA pressure. Left Atrium: Left atrial size was normal in size. Right Atrium: Right atrial size was normal in size. Pericardium: There is no evidence of pericardial effusion. Mitral Valve: The mitral valve is normal in structure. No evidence of mitral valve regurgitation. No evidence of mitral valve stenosis. Tricuspid Valve: The tricuspid valve is normal in structure. Tricuspid valve regurgitation is trivial. Aortic Valve: The aortic valve is tricuspid. Aortic valve regurgitation is not visualized. No aortic stenosis is present. Pulmonic Valve: The pulmonic valve was not well visualized. Pulmonic valve regurgitation is not visualized. Aorta: The aortic root is normal in size and structure and aortic dilatation noted. There is dilatation of the ascending aorta,  measuring 41 mm. Venous: The inferior vena cava is normal in size with greater than 50% respiratory variability, suggesting right atrial pressure of 3 mmHg. IAS/Shunts: The interatrial septum was not well visualized.  LEFT VENTRICLE PLAX 2D LVIDd:         3.90 cm     Diastology LVIDs:         2.15 cm     LV e' medial:    10.30 cm/s LV PW:         0.85 cm     LV E/e' medial:  9.9 LV IVS:        0.95 cm     LV e' lateral:   13.60 cm/s LVOT diam:     1.70 cm     LV E/e' lateral: 7.5 LV SV:         53 LV SV Index:   31 LVOT Area:     2.27 cm  LV Volumes (MOD) LV vol d, MOD A2C: 57.1 ml LV vol d, MOD A4C: 56.1  ml LV vol s, MOD A2C: 20.7 ml LV vol s, MOD A4C: 17.1 ml LV SV MOD A2C:     36.4 ml LV SV MOD A4C:     56.1 ml LV SV MOD BP:      39.6 ml RIGHT VENTRICLE            IVC RV S prime:     7.83 cm/s  IVC diam: 1.70 cm TAPSE (M-mode): 2.1 cm LEFT ATRIUM             Index       RIGHT ATRIUM           Index LA diam:        2.90 cm 1.72 cm/m  RA Area:     10.30 cm LA Vol (A2C):   31.1 ml 18.44 ml/m RA Volume:   23.00 ml  13.64 ml/m LA Vol (A4C):   28.5 ml 16.90 ml/m LA Biplane Vol: 31.3 ml 18.56 ml/m  AORTIC VALVE LVOT Vmax:   108.00 cm/s LVOT Vmean:  72.300 cm/s LVOT VTI:    0.233 m  AORTA Ao Root diam: 2.90 cm Ao Asc diam:  4.10 cm MITRAL VALVE MV Area (PHT): 4.15 cm     SHUNTS MV Decel Time: 183 msec     Systemic VTI:  0.23 m MV E velocity: 102.00 cm/s  Systemic Diam: 1.70 cm MV A velocity: 95.40 cm/s MV E/A ratio:  1.07 Oswaldo Milian MD Electronically signed by Oswaldo Milian MD Signature Date/Time: 12/14/2020/6:00:28 PM    Final     Assessment/Plan: S/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 Post procedure aphasia, improved since last pm. PT/OT/CIR involved in therapy needs prior to d/c home.  Patient up in chair.  Some unsteadiness on her feet per RN.  To get ASA and Plavix No family at  bedside this morning.  NIR following for d/c needs.  Insurance authorization has been submitted by [employer]  Appreciate their assistance.     LOS: 2 days   I spent a total of 30 minutes in face to face in clinical consultation, greater than 50% of which was counseling/coordinating care for (L) MCA aneurysm   KDocia BarrierPA-C 12/15/2020 2:02 PM

## 2020-12-15 NOTE — Progress Notes (Signed)
Occupational Therapy Treatment Patient Details Name: Teresa Galloway MRN: [id number] DOB: April 13, 1955 Today's Date: 12/15/2020    History of present illness 65 yo female s/p 4-vessel cerebral arteriogram followed by embolization of wide neck lobulated L MCA aneurysm on 9/7, post-op incisional groin bleeding and word finding difficulty. 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 PMH includes acute pericarditis 2015, bipolar II disorder, anxiety, MDD.   OT comments  Pt presenting with high motivation and progressing towards established OT goals. Pt performing grooming at sink with Min A and Mod cues; presenting with decreased attention to RUE (dominant hand), coordination, motor planning, cognition, and vision. Pt performing functional mobility with Min Guard-Min A. Pt participating in therapeutic activities to challenge RUE coordination, inattention, and strength as well as visual deficits. Continue to highly recommend dc to CIR and feel she can reach a Mod I level to return home. Will continue to follow acutely as admitted.     Follow Up Recommendations  CIR    Equipment Recommendations  3 in 1 bedside commode    Recommendations for Other Services Rehab consult    Precautions / Restrictions Precautions Precautions: Fall Precaution Comments: Decreased awareness Restrictions Weight Bearing Restrictions: No       Mobility Bed Mobility Overal bed mobility: Needs Assistance Bed Mobility: Supine to Sit     Supine to sit: Min guard     General bed mobility comments: Min Guard A for safety    Transfers Overall transfer level: Needs assistance Equipment used: 2 person hand held assist Transfers: Sit to/from Stand Sit to Stand: Min assist         General transfer comment: Min A for balance once in standing    Balance  Overall balance assessment: Needs assistance Sitting-balance support: Feet supported Sitting balance-Leahy Scale: Fair     Standing balance support: No upper extremity supported;During functional activity Standing balance-Leahy Scale: Poor                             ADL either performed or assessed with clinical judgement   ADL Overall ADL's : Needs assistance/impaired     Grooming: Oral care;Minimal assistance;Standing;Cueing for sequencing Grooming Details (indicate cue type and reason): Pt presenting with decreased balance, attention to R, cognition, vision, and functional use of RUE. Requiring cues for [facility] planning of R hand during oral care. Weak grasp and poor coordination               Lower Body Dressing Details (indicate cue type and reason): Pt only using her L hand to pull up socks. holding RUE towards chest with poor attention to it Toilet Transfer: Minimal assistance;Ambulation Toilet Transfer Details (indicate cue type and reason): Min A for gaining balance once in standing         Functional mobility during ADLs: Min guard;Minimal assistance General ADL Comments: Pt very motivated. Change in functional performance with deficits in vision, coordination, balance, cognition, and functional use of dominant hand.     Vision   Vision Assessment?: Vision impaired- to be further tested in functional context;Yes Eye Alignment: Within Functional Limits Ocular Range of Motion: Within Functional Limits Alignment/Gaze Preference: Within Defined Limits Tracking/Visual Pursuits: Decreased smoothness of horizontal tracking;Decreased smoothness of vertical tracking Saccades: Additional eye shifts occurred during testing;Additional head turns occurred during testing;Decreased speed of saccadic  movement Visual Fields: Right visual field deficit Additional Comments: Reporting shiney light in her central vision. inattention to right visual field; possible R field  cut. Pt not able to locate grooming items on R side until performing head turns and item in central vision. Pt also reporting items (when she reaches for them) jump to R. Pt stating "things keep moving from me"   Perception     Praxis      Cognition Arousal/Alertness: Awake/alert Behavior During Therapy: [facility] for tasks assessed/performed;Impulsive Overall Cognitive Status: Impaired/Different from baseline Area of Impairment: Attention;Memory;Following commands;Safety/judgement;Awareness;Problem solving                   Current Attention Level: Sustained Memory: Decreased short-term memory Following Commands: Follows one step commands inconsistently Safety/Judgement: Decreased awareness of safety;Decreased awareness of deficits Awareness: Intellectual Problem Solving: Slow processing;Decreased initiation;Difficulty sequencing;Requires tactile cues;Requires verbal cues General Comments: Pt with decreased awareness, attention, and problem solving. Pt easily distracted by external environment. Poor sequencing and awareness to correct errors. Very motivated and able accept simple cues well. becoming tearful at end of session as she is worried about her mother's health. Also having pt locate trash can to throw away trash at sink; pt walking in circles in bathroom untill realizing there was no trash can. Pt then walking out of bathroom and turning L despite door blocking her way. Pt then (after looking L) turned R and located trash can on far side of the room to her R.        Exercises Exercises: Other exercises Other Exercises Other Exercises: Pt stacking medicine cups with RUE; stacking 10 cups. 2 reps. Cues for sequencing and motor planning Other Exercises: Pt stacking wash clothes. Focusing on attention to RUE, pinch strength, coordination, vision, and bilateral coordination. 4 clothes. 2 reps.   Shoulder Instructions       General Comments Timothy Lasso, present. VSS on RA     Pertinent Vitals/ Pain       Pain Assessment: Faces Faces Pain Scale: No hurt Pain Intervention(s): Monitored during session;Limited activity within patient's tolerance;Repositioned  Home Living                                          Prior Functioning/Environment              Frequency  Min 2X/week        Progress Toward Goals  OT Goals(current goals can now be found in the care plan section)  Progress towards OT goals: Progressing toward goals  Acute Rehab OT Goals Patient Stated Goal: to get better and take care of her mom OT Goal Formulation: With patient Time For Goal Achievement: 12/28/20 Potential to Achieve Goals: Good ADL Goals Pt Will Perform Eating: with set-up;sitting Pt Will Perform Grooming: with set-up;with supervision;sitting Pt Will Perform Upper Body Bathing: with set-up;with supervision;sitting Pt Will Perform Lower Body Bathing: with min guard assist Pt Will Transfer to Toilet: with supervision;ambulating Additional ADL Goal #1: Pt will demonstrate anticipatory awareness during ADL tasks  Plan Discharge plan remains appropriate    Co-evaluation                 AM-PAC OT "6 Clicks" Daily Activity     Outcome Measure   Help from another person eating meals?: A Lot Help from another person taking care of personal grooming?: A Lot Help from another person  toileting, which includes using toliet, bedpan, or urinal?: A Lot Help from another person bathing (including washing, rinsing, drying)?: A Lot Help from another person to put on and taking off regular upper body clothing?: A Lot Help from another person to put on and taking off regular lower body clothing?: A Lot 6 Click Score: 12    End of Session Equipment Utilized During Treatment: Gait belt  OT Visit Diagnosis: Unsteadiness on feet (R26.81);Other abnormalities of gait and mobility (R26.89);Apraxia (R48.2);Low vision, both eyes (H54.2);Other symptoms and  signs involving cognitive function;Dizziness and giddiness (R42);Pain Pain - part of body:  (headache)   Activity Tolerance Patient tolerated treatment well   Patient Left in chair;with call bell/phone within reach;with chair alarm set;with family/visitor present   Nurse Communication Mobility status        Time: [id number] OT Time Calculation (min): 38 min  Charges: OT General Charges $OT Visit: 1 Visit OT Treatments $Self Care/Home Management : 23-37 mins $Therapeutic Activity: 8-22 mins  Remmi Armenteros MSOT, OTR/L Acute Rehab Pager: [phone] Office: [place] 12/15/2020, 11:52 AM

## 2020-12-15 NOTE — Progress Notes (Signed)
[facility] notification completed, ID is: [id number]  Gilmore Laroche, MSW, [facility]

## 2020-12-15 NOTE — Progress Notes (Signed)
Inpatient Rehab Admissions Coordinator:   Received update from [place] that [place] would need to be given through [facility] since that was who gave auth for her planned procedure.  Contacted the CM there, Alyse Low [phone]) who said that she would only be approved for rehab at [facility] in [place].  I let the pt know and she was very upset, as she doesn't have a support system in [place], and is not thrilled about the idea of having to be 2 hours away, or use her health team advantage (since they would require a copay for AIR).  Later spoke with acute CM, Almyra Free, who discovered that patient is not service connected through the [place], so does not have rehab benefits through the [place].  Our only option is to try and use [facility], if she is agreeable.  I would not be able to open HTA for authorization until Monday since they are closed on the weekend, so I will follow up with the patient on Monday.   Shann Medal, PT, DPT Admissions Coordinator [phone] 12/15/20  4:33 PM

## 2020-12-15 NOTE — Progress Notes (Signed)
Physical Therapy Treatment Patient Details Name: Teresa Galloway MRN: [id number] DOB: November 03, 1955 Today's Date: 12/15/2020    History of Present Illness 65 yo female s/p 4-vessel cerebral arteriogram followed by embolization of wide neck lobulated L MCA aneurysm on 9/7, post-op incisional groin bleeding and word finding difficulty. 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 PMH includes acute pericarditis 2015, bipolar II disorder, anxiety, MDD.    PT Comments    Pt very motivated to mobilize out of room, pt's ex-husband [place] and mother are at bedside. Pt tolerated hallway distance gait well with seated rest break to recover fatigue, pt also requiring frequent cuing for attention to R and avoiding obstacles placed to her R side. Pt's R inattention, command following significantly fatigues by end of session. Pt does express to PT that she is concerned about her safety especially with R visual field deficits, inattention, and decreased sensation. CIR remains appropriate disposition to return pt to mod I level post-acutely.    Follow Up Recommendations  CIR     Equipment Recommendations  None recommended by PT    Recommendations for Other Services       Precautions / Restrictions Precautions Precautions: Fall Precaution Comments: Decreased awareness Restrictions Weight Bearing Restrictions: No    Mobility  Bed Mobility Overal bed mobility: Needs Assistance             General bed mobility comments: up in chair upon PT arrival to room.    Transfers Overall transfer level: Needs assistance Equipment used: None Transfers: Sit to/from Stand Sit to Stand: Min assist         General transfer comment: min assist for initial power up, cues to avoid breath holding during rise.  Ambulation/Gait Ambulation/Gait  assistance: Min assist Gait Distance (Feet): 100 Feet (x2 - seated rest) Assistive device: None Gait Pattern/deviations: Step-through pattern;Decreased stride length;Narrow base of support;Drifts right/left Gait velocity: decr   General Gait Details: light assist to steady, guide pt's trajectory in room and hallway, pt nearly walking into objects on the R x2 and would have if not for PT intervention. worsening unsteadiness with head turns L/R   Stairs             Wheelchair Mobility    Modified Rankin (Stroke Patients Only) Modified Rankin (Stroke Patients Only) Pre-Morbid Rankin Score: No symptoms Modified Rankin: Moderately severe disability     Balance Overall balance assessment: Needs assistance Sitting-balance support: Feet supported Sitting balance-Leahy Scale: Fair     Standing balance support: No upper extremity supported;During functional activity Standing balance-Leahy Scale: Fair                              Cognition Arousal/Alertness: Awake/alert Behavior During Therapy: WFL for tasks assessed/performed;Impulsive Overall Cognitive Status: Impaired/Different from baseline Area of Impairment: Attention;Memory;Following commands;Safety/judgement;Awareness;Problem solving                   Current Attention Level: Sustained Memory: Decreased short-term memory Following Commands: Follows one step commands inconsistently Safety/Judgement: Decreased awareness of safety;Decreased awareness of deficits Awareness: Intellectual Problem Solving: Slow processing;Decreased initiation;Difficulty sequencing;Requires tactile cues;Requires verbal cues General Comments: Pt verbalizes to me she has a visual field deficit on the R, but not able to compensate functionally without cuing. Pt demonstrates fatiguability with comand following, initially able to follow up  to 3 step commands but by end of session requires repeated cuing. PT cuing pt to use RUE  multiple times during session, for instance "use your R hand to take your mask off" and pt immediately states "I don't know how to do it". R inattention in hallway, requires cues to avoid obstacles and scan environment towards R.      Exercises Other Exercises Other Exercises: Sit<>stands, x5 with R foot in rear of tandem stance and x5 with L. Requires boost assist to rise from seated surface and assist for use of RUE appropriately from chair of recliner.    General Comments General comments (skin integrity, edema, etc.): Ex-husband [place] and mother at bedside      Pertinent Vitals/Pain Pain Assessment: No/denies pain Faces Pain Scale: No hurt Pain Intervention(s): Monitored during session    Home Living                      Prior Function            PT Goals (current goals can now be found in the care plan section) Acute Rehab PT Goals Patient Stated Goal: to get better and take care of her mom PT Goal Formulation: With patient Time For Goal Achievement: 12/28/20 Potential to Achieve Goals: Good Progress towards PT goals: Progressing toward goals    Frequency    Min 4X/week      PT Plan Current plan remains appropriate    Co-evaluation              AM-PAC PT "6 Clicks" Mobility   Outcome Measure  Help needed turning from your back to your side while in a flat bed without using bedrails?: A Little Help needed moving from lying on your back to sitting on the side of a flat bed without using bedrails?: A Little Help needed moving to and from a bed to a chair (including a wheelchair)?: A Little Help needed standing up from a chair using your arms (e.g., wheelchair or bedside chair)?: A Little Help needed to walk in hospital room?: A Little Help needed climbing 3-5 steps with a railing? : A Lot 6 Click Score: 17    End of Session Equipment Utilized During Treatment: Gait belt Activity Tolerance: Patient tolerated treatment well Patient left: in  chair;with chair alarm set;with call bell/phone within reach;with family/visitor present Nurse Communication: Mobility status PT Visit Diagnosis: Other abnormalities of gait and mobility (R26.89);Other symptoms and signs involving the nervous system (R29.898)     Time: [id number] PT Time Calculation (min) (ACUTE ONLY): 28 min  Charges:  $Gait Training: 8-22 mins $Therapeutic Activity: 8-22 mins                     Stacie Glaze, PT DPT Acute Rehabilitation Services Pager [phone]  Office [phone]    [address] 12/15/2020, 2:14 PM

## 2020-12-15 NOTE — Progress Notes (Signed)
Inpatient Rehab Admissions Coordinator:   Met with patient at the bedside to discuss recommendations for CIR.  We discussed average length of stay about 2 weeks, dependent upon progress, and goals typically of supervision to mod I.  Spoke with Dr. Naaman Plummer who feels pt can reach mod I level, given decreased caregiver support at discharge.  Pt's ex-husband, Patrick Jupiter, is at the bedside, and confirms that she has very little in the way of support.  He will be here for the next week but then has to return to MD.  Pt has a sister who works full time, and her mother is currently battling pancreatic cancer.  I will start insurance auth with the [place] today and see if we can get her to cir quickly.    Shann Medal, PT, DPT Admissions Coordinator [phone] 12/15/20  11:58 AM

## 2020-12-16 MED ORDER — LIP MEDEX EX OINT
TOPICAL_OINTMENT | CUTANEOUS | Status: DC | PRN
  Filled 2020-12-16: qty 7

## 2020-12-16 NOTE — Progress Notes (Signed)
Teresa Pandy, MD paged about NS gtt. Pt NIH: 0 and renal fx WDL, pt having c/o frequent urination secondary to gtt. Orders to switch gtt to [facility].

## 2020-12-16 NOTE — Progress Notes (Signed)
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 Authorizing Provider  ALPRAZolam (XANAX) 0.25 MG tablet Take 0.25 mg by mouth 4 (four) times daily as needed for anxiety.    Yes [provider]  aspirin EC 81 MG tablet Take 81 mg by mouth daily. Swallow whole.   Yes [provider]  Baclofen 5 MG TABS Take 5 mg by mouth 3 (three) times daily. Patient taking differently: Take 5 mg by mouth in the morning and at bedtime. 11/07/20  Yes JTomi Likens Adam R, DO  carboxymethylcellulose (REFRESH PLUS) 0.5 % SOLN Place 1 drop into both eyes 3 (three) times daily as needed (dry eyes).   Yes [provider]  clopidogrel (PLAVIX) 75 MG tablet Take 75 mg by mouth daily.   Yes [provider]  dicyclomine (BENTYL) 10 MG capsule Take 1 capsule (10 mg total) by mouth 3 (three) times daily before meals. Patient taking differently: Take 10 mg by mouth 3 (three) times daily as needed for spasms. 11/28/17  Yes Setzer, Terri L, NP  DULoxetine (CYMBALTA) 60 MG capsule Take 60 mg by mouth daily. 08/29/20  Yes [provider]  EPIPEN 2-PAK 0.3 MG/0.3ML SOAJ injection Inject 0.3 mg into the muscle as needed for anaphylaxis. 09/11/15  Yes [provider]  levothyroxine (SYNTHROID, LEVOTHROID) 75 MCG tablet Take 75 mcg by  mouth daily before breakfast.   Yes [provider]  omeprazole (PRILOSEC) 20 MG capsule Take 20 mg by mouth daily.   Yes [provider]  ondansetron (ZOFRAN ODT) 4 MG disintegrating tablet Take 1 tablet (4 mg total) by mouth every 8 (eight) hours as needed for nausea or vomiting. 07/26/20  Yes Jaffe, Adam R, DO  pregabalin (LYRICA) 150 MG capsule Take 1 capsule (150 mg total) by mouth 2 (two) times daily. 07/27/20  Yes Jaffe, Adam R, DO  propranolol (INDERAL) 60 MG tablet Take 60 mg by mouth daily.   Yes [provider]  rizatriptan (MAXALT-MLT) 10 MG disintegrating tablet Take 10 mg by mouth as needed for migraine. May repeat in 2 hours if needed   Yes [provider]  valsartan (DIOVAN) 80 MG tablet Take 80 mg by mouth daily. 06/15/20  Yes [provider]  diphenhydrAMINE (BENADRYL) 25 MG tablet Take 25 mg by mouth daily as needed for allergies.    [provider]  Eyelid Cleansers (AVENOVA) 0.01 % SOLN Apply 1 application topically 2 (two) times daily as needed (eyelid cleanser).    [provider]     Vital Signs: BP 133/62   Pulse 76   Temp 98.6 F (37 C) (Oral)   Resp 15   Ht '5\' 6"'$  (1.676 m)   Wt 134 lb (60.8 kg)   SpO2 98%   BMI 21.63 kg/m   Physical Exam Constitutional:      Comments: Smile =  Face symmetrical  HENT:     Mouth/Throat:     Mouth: Mucous membranes are moist.  Musculoskeletal:        General: No swelling. Normal range of motion.     Right lower leg: No edema.     Left lower leg: No edema.  Skin:    General: Skin is warm.  Neurological:     Mental Status: Teresa Galloway is alert and oriented to person, place, and time.  Psychiatric:        Behavior: Behavior normal.    Imaging: CT HEAD WO CONTRAST (5MM)  Result Date: 12/14/2020 CLINICAL DATA:  Stroke, follow-up examination EXAM: CT HEAD WITHOUT CONTRAST TECHNIQUE: Contiguous axial images were obtained from the base of the skull through the vertex  without intravenous contrast. COMPARISON:  MRI 12/13/2020 FINDINGS: Brain: Normal anatomic configuration. Parenchymal volume loss is commensurate with the patient's age. Mild periventricular white matter changes are present likely reflecting the sequela of small vessel ischemia. Known punctate acute infarcts within the left cerebral hemisphere are not well appreciated on this examination. No abnormal intra or extra-axial mass lesion or fluid collection. No abnormal mass effect or midline shift. No evidence of acute intracranial hemorrhage or infarct. Ventricular size is normal. Cerebellum unremarkable. Vascular: No asymmetric hyperdense vasculature at the skull base. Skull: Intact Sinuses/Orbits: Paranasal sinuses are clear. Orbits are unremarkable. Other: Mastoid air cells and middle ear cavities are clear. IMPRESSION: No acute intracranial abnormality. Known punctate infarcts within the left cerebral hemisphere are not well appreciated on this examination. No abnormal mass effect or superimposed acute intracranial hemorrhage. Electronically Signed   By: Fidela Salisbury M.D.   On: 12/14/2020 01:25   MR ANGIO HEAD WO CONTRAST  Result Date: 12/14/2020 CLINICAL DATA:  Initial evaluation for neuro deficit, stroke suspected. 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 EXAM: MRI HEAD WITHOUT CONTRAST MRA HEAD WITHOUT CONTRAST TECHNIQUE: Multiplanar, multi-echo pulse sequences of the brain and surrounding structures were acquired without intravenous contrast. Angiographic images of the Circle of Willis were acquired using MRA technique without intravenous contrast. COMPARISON:  Comparison made with previous MRIs from 11/25/2020. FINDINGS: MRI HEAD FINDINGS Brain: Cerebral volume stable, and within normal limits for age. Patchy chronic microvascular ischemic disease involving the periventricular and deep white matter of both cerebral hemispheres as well as the pons, mild-to-moderate in  nature, also unchanged. Probable small remote lacunar type infarct at the inferior left cerebellum again noted, stable. Subtle scattered diffusion abnormality seen involving the cortical gray matter of the high left frontal lobe (series 2, image is 40, 38), with additional minimal patchy involvement of the posterior left temporal occipital region (series 2, images 21, possibly 18). Possible subtle patchy involvement of the left frontal operculum as well (series 2, image 28) findings consistent with subtle acute ischemic infarcts, left MCA distribution, and likely embolic. No associated mass effect. No associated hemorrhage. There is a single subcentimeter focus of susceptibility artifact seen at the left frontal region (series 7, image 63), new from prior. No other visible evidence for acute intracranial hemorrhage on corresponding sequences. No mass lesion, midline shift or mass effect. No hydrocephalus or extra-axial fluid collection. Pituitary gland and suprasellar region normal. Midline structures intact. Vascular: Major intracranial vascular flow voids are maintained. Skull and upper cervical spine: Craniocervical junction within normal limits. Bone marrow signal intensity normal. No scalp soft tissue abnormality. Sinuses/Orbits: Patient status post bilateral ocular lens replacement. Left gaze noted. Mild scattered mucosal thickening noted within the sphenoid  ethmoidal sinuses. Trace left mastoid effusion, of doubtful significance. Inner ear structures grossly normal. Other: None. MRA HEAD FINDINGS Anterior circulation: Visualized distal cervical segments of the internal carotid arteries are widely patent with antegrade flow. Petrous, cavernous, and supraclinoid segments widely patent without stenosis or other abnormality. A1 segments widely patent. Normal anterior communicating artery complex. Anterior cerebral arteries widely patent to their distal aspects without stenosis. Right M1 widely patent. Normal  right MCA bifurcation. Distal right MCA branches remain well perfused. Interval placement of a pipeline flow diverter stent across the distal left M1 segment and left MCA bifurcation for treatment of previously identified left MCA bifurcation aneurysm. Patent flow seen through the stent, with robust perfusion of the MCA branches distally. Persistent filling of the MCA bifurcation aneurysm sac measuring 4 x 4 x 2 mm, stable from prior. Posterior circulation: Bot both V4 segments widely patent to the vertebrobasilar junction. Left vertebral artery slightly dominant. Right PICA origin patent. Left PICA not seen. Basilar patent to its distal aspect without stenosis. Superior cerebral arteries patent bilaterally. Both PCAs primarily supplied via the basilar, although tiny bilateral posterior communicating arteries are noted. PCAs remain widely patent to their distal aspects. Anatomic variants: Dominant left vertebral artery. IMPRESSION: MRI HEAD IMPRESSION: 1. Subtle scattered small volume acute left MCA distribution infarcts as above. No associated mass effect. 2. Single new focus of susceptibility artifact involving the left frontal lobe, of uncertain significance, with no definite evidence for intracranial hemorrhage on corresponding sequences. Correlation with dedicated head CT suggested to ensure no underlying small parenchymal bleed at this location. 3. Underlying mild-to-moderate chronic microvascular ischemic disease, stable. MRA HEAD IMPRESSION: 1. Sequelae of interval endovascular treatment of left MCA bifurcation aneurysm with pipeline flow diverter stent. Patent flow is seen through the stent with good perfusion of the left MCA branches distally. Persistent filling of the aneurysm at this time, stable in size measuring 4 x 4 x 2 mm. 2. Otherwise stable and normal intracranial MRA. These results were communicated to Dr. Rory Percy at 12:32 am on 12/14/2020 by text page via the [facility] messaging system. Electronically  Signed   By: Jeannine Boga M.D.   On: 12/14/2020 00:34   MR BRAIN WO CONTRAST  Result Date: 12/14/2020 CLINICAL DATA:  Initial evaluation for neuro deficit, stroke suspected. 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 EXAM: MRI HEAD WITHOUT CONTRAST MRA HEAD WITHOUT CONTRAST TECHNIQUE: Multiplanar, multi-echo pulse sequences of the brain and surrounding structures were acquired without intravenous contrast. Angiographic images of the Circle of Willis were acquired using MRA technique without intravenous contrast. COMPARISON:  Comparison made with previous MRIs from 11/25/2020. FINDINGS: MRI HEAD FINDINGS Brain: Cerebral volume stable, and within normal limits for age. Patchy chronic microvascular ischemic disease involving the periventricular and deep white matter of both cerebral hemispheres as well as the pons, mild-to-moderate in nature, also unchanged. Probable small remote lacunar type infarct at the inferior left cerebellum again noted, stable. Subtle scattered diffusion abnormality seen involving the cortical gray matter of the high left frontal lobe (series 2, image is 40, 38), with additional minimal patchy involvement of the posterior left temporal occipital region (series 2, images 21, possibly 18). Possible subtle patchy involvement of the left frontal operculum as well (series 2, image 28) findings consistent with subtle acute ischemic infarcts, left MCA distribution, and likely embolic. No associated mass effect. No associated hemorrhage. There is a single subcentimeter focus of susceptibility artifact seen at the left frontal region (series 7,  image 63), new from prior. No other visible evidence for acute intracranial hemorrhage on corresponding sequences. No mass lesion, midline shift or mass effect. No hydrocephalus or extra-axial fluid collection. Pituitary gland and suprasellar region normal. Midline structures intact. Vascular: Major intracranial  vascular flow voids are maintained. Skull and upper cervical spine: Craniocervical junction within normal limits. Bone marrow signal intensity normal. No scalp soft tissue abnormality. Sinuses/Orbits: Patient status post bilateral ocular lens replacement. Left gaze noted. Mild scattered mucosal thickening noted within the sphenoid ethmoidal sinuses. Trace left mastoid effusion, of doubtful significance. Inner ear structures grossly normal. Other: None. MRA HEAD FINDINGS Anterior circulation: Visualized distal cervical segments of the internal carotid arteries are widely patent with antegrade flow. Petrous, cavernous, and supraclinoid segments widely patent without stenosis or other abnormality. A1 segments widely patent. Normal anterior communicating artery complex. Anterior cerebral arteries widely patent to their distal aspects without stenosis. Right M1 widely patent. Normal right MCA bifurcation. Distal right MCA branches remain well perfused. Interval placement of a pipeline flow diverter stent across the distal left M1 segment and left MCA bifurcation for treatment of previously identified left MCA bifurcation aneurysm. Patent flow seen through the stent, with robust perfusion of the MCA branches distally. Persistent filling of the MCA bifurcation aneurysm sac measuring 4 x 4 x 2 mm, stable from prior. Posterior circulation: Bot both V4 segments widely patent to the vertebrobasilar junction. Left vertebral artery slightly dominant. Right PICA origin patent. Left PICA not seen. Basilar patent to its distal aspect without stenosis. Superior cerebral arteries patent bilaterally. Both PCAs primarily supplied via the basilar, although tiny bilateral posterior communicating arteries are noted. PCAs remain widely patent to their distal aspects. Anatomic variants: Dominant left vertebral artery. IMPRESSION: MRI HEAD IMPRESSION: 1. Subtle scattered small volume acute left MCA distribution infarcts as above. No  associated mass effect. 2. Single new focus of susceptibility artifact involving the left frontal lobe, of uncertain significance, with no definite evidence for intracranial hemorrhage on corresponding sequences. Correlation with dedicated head CT suggested to ensure no underlying small parenchymal bleed at this location. 3. Underlying mild-to-moderate chronic microvascular ischemic disease, stable. MRA HEAD IMPRESSION: 1. Sequelae of interval endovascular treatment of left MCA bifurcation aneurysm with pipeline flow diverter stent. Patent flow is seen through the stent with good perfusion of the left MCA branches distally. Persistent filling of the aneurysm at this time, stable in size measuring 4 x 4 x 2 mm. 2. Otherwise stable and normal intracranial MRA. These results were communicated to Dr. Rory Percy at 12:32 am on 12/14/2020 by text page via the [facility] messaging system. Electronically Signed   By: Jeannine Boga M.D.   On: 12/14/2020 00:34   ECHOCARDIOGRAM COMPLETE  Result Date: 12/14/2020    ECHOCARDIOGRAM REPORT   Patient Name:   Teresa Galloway Date of Exam: 12/14/2020 Medical Rec #:  [id number]      Height:       66.0 in Accession #:    [id number]     Weight:       134.0 lb Date of Birth:  Aug 14, 1955       BSA:          1.687 m Patient Age:    65 years       BP:           113/52 mmHg Patient Gender: F              HR:  71 bpm. Exam Location:  Inpatient Procedure: 2D Echo, 3D Echo, Cardiac Doppler and Color Doppler Indications:    Stroke  History:        Patient has prior history of Echocardiogram examinations, most                 recent 04/06/2014. Abnormal ECG, Arrythmias:Tachycardia; Risk                 Factors:Dyslipidemia, Sleep Apnea and Former Smoker. Pericardial                 effusion. Pericarditis.  Sonographer:    Roseanna Rainbow RDCS Referring Phys: [id number] ASHISH ARORA IMPRESSIONS  1. Left ventricular ejection fraction, by estimation, is 60 to 65%. The left ventricle has normal function.  The left ventricle has no regional wall motion abnormalities. There is mild left ventricular hypertrophy. Left ventricular diastolic parameters were normal.  2. Right ventricular systolic function is normal. The right ventricular size is normal. Tricuspid regurgitation signal is inadequate for assessing PA pressure.  3. The mitral valve is normal in structure. No evidence of mitral valve regurgitation. No evidence of mitral stenosis.  4. The aortic valve is tricuspid. Aortic valve regurgitation is not visualized. No aortic stenosis is present.  5. Aortic dilatation noted. There is dilatation of the ascending aorta, measuring 41 mm.  6. The inferior vena cava is normal in size with greater than 50% respiratory variability, suggesting right atrial pressure of 3 mmHg. FINDINGS  Left Ventricle: Left ventricular ejection fraction, by estimation, is 60 to 65%. The left ventricle has normal function. The left ventricle has no regional wall motion abnormalities. The left ventricular internal cavity size was normal in size. There is  mild left ventricular hypertrophy. Left ventricular diastolic parameters were normal. Right Ventricle: The right ventricular size is normal. No increase in right ventricular wall thickness. Right ventricular systolic function is normal. Tricuspid regurgitation signal is inadequate for assessing PA pressure. Left Atrium: Left atrial size was normal in size. Right Atrium: Right atrial size was normal in size. Pericardium: There is no evidence of pericardial effusion. Mitral Valve: The mitral valve is normal in structure. No evidence of mitral valve regurgitation. No evidence of mitral valve stenosis. Tricuspid Valve: The tricuspid valve is normal in structure. Tricuspid valve regurgitation is trivial. Aortic Valve: The aortic valve is tricuspid. Aortic valve regurgitation is not visualized. No aortic stenosis is present. Pulmonic Valve: The pulmonic valve was not well visualized. Pulmonic valve  regurgitation is not visualized. Aorta: The aortic root is normal in size and structure and aortic dilatation noted. There is dilatation of the ascending aorta, measuring 41 mm. Venous: The inferior vena cava is normal in size with greater than 50% respiratory variability, suggesting right atrial pressure of 3 mmHg. IAS/Shunts: The interatrial septum was not well visualized.  LEFT VENTRICLE PLAX 2D LVIDd:         3.90 cm     Diastology LVIDs:         2.15 cm     LV e' medial:    10.30 cm/s LV PW:         0.85 cm     LV E/e' medial:  9.9 LV IVS:        0.95 cm     LV e' lateral:   13.60 cm/s LVOT diam:     1.70 cm     LV E/e' lateral: 7.5 LV SV:         53 LV  SV Index:   31 LVOT Area:     2.27 cm  LV Volumes (MOD) LV vol d, MOD A2C: 57.1 ml LV vol d, MOD A4C: 56.1 ml LV vol s, MOD A2C: 20.7 ml LV vol s, MOD A4C: 17.1 ml LV SV MOD A2C:     36.4 ml LV SV MOD A4C:     56.1 ml LV SV MOD BP:      39.6 ml RIGHT VENTRICLE            IVC RV S prime:     7.83 cm/s  IVC diam: 1.70 cm TAPSE (M-mode): 2.1 cm LEFT ATRIUM             Index       RIGHT ATRIUM           Index LA diam:        2.90 cm 1.72 cm/m  RA Area:     10.30 cm LA Vol (A2C):   31.1 ml 18.44 ml/m RA Volume:   23.00 ml  13.64 ml/m LA Vol (A4C):   28.5 ml 16.90 ml/m LA Biplane Vol: 31.3 ml 18.56 ml/m  AORTIC VALVE LVOT Vmax:   108.00 cm/s LVOT Vmean:  72.300 cm/s LVOT VTI:    0.233 m  AORTA Ao Root diam: 2.90 cm Ao Asc diam:  4.10 cm MITRAL VALVE MV Area (PHT): 4.15 cm     SHUNTS MV Decel Time: 183 msec     Systemic VTI:  0.23 m MV E velocity: 102.00 cm/s  Systemic Diam: 1.70 cm MV A velocity: 95.40 cm/s MV E/A ratio:  1.07 Oswaldo Milian MD Electronically signed by Oswaldo Milian MD Signature Date/Time: 12/14/2020/6:00:28 PM    Final     Labs:  CBC: Recent Labs    12/13/20 0645 12/14/20 0642  WBC 6.2 7.6  HGB 13.3 10.4*  HCT 39.6 30.9*  PLT 225 202    COAGS: Recent Labs    12/13/20 0645  INR 0.9    BMP: Recent Labs     06/27/20 1349 12/13/20 0645 12/14/20 0642 12/15/20 0243  NA 136 139 140 142  K 4.5 3.6 2.9* 3.5  CL 101 105 110 114*  CO2 '30 27 24 25  '$ GLUCOSE 101* 98 105* 108*  BUN '20 18 8 '$ 7*  CALCIUM 9.3 8.9 7.8* 8.2*  CREATININE 0.62 0.64 0.51 0.59  GFRNONAA  --  >60 >60 >60    LIVER FUNCTION TESTS: Recent Labs    06/27/20 1349  BILITOT 0.3  AST 14  ALT 14  ALKPHOS 91  PROT 6.9  ALBUMIN 4.4    Assessment and Plan:  L MCA aneurysm embolization 12/13/20 Doing quite well today Dr Estanislado Pandy has seen and evaluated pt May tx to step down bed if need Rehab coordination - on hold over weekend See social work note form Friday pm  Pt is aware Hope to go to Rehab asap   Electronically Signed: Lavonia Drafts, PA-C 12/16/2020, 8:55 AM   I spent a total of 15 Minutes at the the patient's bedside AND on the patient's hospital floor or unit, greater than 50% of which was counseling/coordinating care for L MCA aneurysm

## 2020-12-17 ENCOUNTER — Inpatient Hospital Stay (HOSPITAL_COMMUNITY): Payer: No Typology Code available for payment source

## 2020-12-17 DIAGNOSIS — I724 Aneurysm of artery of lower extremity: Secondary | ICD-10-CM

## 2020-12-17 NOTE — Progress Notes (Signed)
While pivoting from wheelchair to bed, this RN noted new bruising to R flank above R groin site. Patient had previously ambulated to restroom and did not complain of any pain. HR 80, BP 132/62. Upon assessment, skin was soft, warm to touch, and tender. Notified Dr. Estanislado Pandy and received new orders for an [place] of groin. Keep patient bedrest with bathroom privileges per Dr. Estanislado Pandy.

## 2020-12-17 NOTE — Progress Notes (Signed)
VASCULAR LAB   Ultrasound of right groin has been performed.  See CV proc for preliminary results.   Zayonna Ayuso, RVT 12/17/2020, 3:40 PM

## 2020-12-17 NOTE — Progress Notes (Signed)
  L ICA aneurysm embolization 12/13/20 in IR with Dr Estanislado Pandy  No changes really today Pt is up in chair Eating Reg diet Denies pain or headache Denies N/V Vision better daily Does feel like she has to "think about even most mundane things before performing then-- like answering her cell phone"  Still awaiting CIR eval Monday 9/12 For Rehab asap

## 2020-12-17 NOTE — Progress Notes (Addendum)
Prior to ambulation to restroom, patient had dizziness and brief loss of strength. HR 79 BP 100/47 (64). This nurse supported her and she was able to pivot and sit down in chair. Patient stated that she "felt like it was about to get dark, but it didn't". An additional nurse was called to bedside and with 2-person assist, we pivoted patient to bed. Reassessment of BP was 132/60, HR 81. Blood pressure medications held.  Pt stated that this dizziness occurs occasionally at home, but hasn't since prior to this admission.

## 2020-12-18 NOTE — Anesthesia Postprocedure Evaluation (Signed)
Anesthesia Post Note  Patient: Teresa Galloway  Procedure(s) Performed: IR WITH ANESTHESIA EBOLIZATION     Anesthesia Post Evaluation No notable events documented.  Last Vitals:  Vitals:   12/18/20 0700 12/18/20 0737  BP: 132/65   Pulse:    Resp: 18   Temp:  37.2 C  SpO2:      Last Pain:  Vitals:   12/18/20 0737  TempSrc: Oral  PainSc:                  Nolon Nations

## 2020-12-18 NOTE — Progress Notes (Signed)
RE: Teresa Galloway Date of Birth: 09/18/1955 Date: 12/18/20  Please be advised that the above-named patient will require a short-term nursing home stay - anticipated 30 days or less for rehabilitation and strengthening.  The plan is for return home.

## 2020-12-18 NOTE — Progress Notes (Signed)
Physical Therapy Treatment Patient Details Name: Teresa Galloway MRN: [id number] DOB: January 25, 1956 Today's Date: 12/18/2020   History of Present Illness 65 yo female s/p 4-vessel cerebral arteriogram followed by embolization of wide neck lobulated L MCA aneurysm on 9/7, post-op incisional groin bleeding and word finding difficulty. MRI brain shows Subtle scattered small volume acute left MCA distribution infarcts(frontal,temporal occipital), CT head showing no acute abnormality except for known puncate infarcts in L cerebral hemisphere. PMH includes acute pericarditis 2015, bipolar II disorder, anxiety, MDD.    PT Comments    Pt pleasant and agreeable to mobility. Pt ambulatory in hallway with improved steadiness, continues to demonstrate difficulty with head turns, directional changes and requires light PT assist to correct. Pt with decreased proprioception R foot noticed during gait today, PT gave LE exercises to improve this. Follow up plan updated to SNF per pt preference.    Recommendations for follow up therapy are one component of a multi-disciplinary discharge planning process, led by the attending physician.  Recommendations may be updated based on patient status, additional functional criteria and insurance authorization.  Follow Up Recommendations  SNF     Equipment Recommendations  None recommended by PT    Recommendations for Other Services       Precautions / Restrictions Precautions Precautions: Fall Restrictions Weight Bearing Restrictions: No     Mobility  Bed Mobility Overal bed mobility: Needs Assistance Bed Mobility: Supine to Sit     Supine to sit: Min guard     General bed mobility comments: safety    Transfers Overall transfer level: Needs assistance Equipment used: None Transfers: Sit to/from Stand Sit to Stand: Min guard         General transfer comment: Min guard for safety, attempts to rise and stand without PT beside  pt  Ambulation/Gait Ambulation/Gait assistance: Min guard;Min assist Gait Distance (Feet): 150 Feet Assistive device: None Gait Pattern/deviations: Step-through pattern;Decreased stride length;Narrow base of support;Drifts right/left     General Gait Details: close guard for safety, min assist needed when challenging pt balance with head turns, backwards walking, directional changes.   Stairs             Wheelchair Mobility    Modified Rankin (Stroke Patients Only) Modified Rankin (Stroke Patients Only) Pre-Morbid Rankin Score: No symptoms Modified Rankin: Moderately severe disability     Balance Overall balance assessment: Needs assistance Sitting-balance support: Feet supported Sitting balance-Leahy Scale: Good     Standing balance support: No upper extremity supported;During functional activity Standing balance-Leahy Scale: Fair Standing balance comment: Pt static standing balance is good, dynamic standing balance is fair/good. Pt able to maintain good standing balance with no LOBs during hand washing at sink and disposing of paper towel             High level balance activites: Backward walking;Head turns;Turns High Level Balance Comments: fast/slow walking and 180 deg turn and stop WFL. Pt with weaving of gait and unsteadiness requiring PT assist during horizontal >vertical head turns, backwards walking.            Cognition Arousal/Alertness: Awake/alert Behavior During Therapy: WFL for tasks assessed/performed;Impulsive Overall Cognitive Status: Impaired/Different from baseline Area of Impairment: Safety/judgement;Awareness;Problem solving;Attention                   Current Attention Level: Sustained   Following Commands: Follows multi-step commands inconsistently Safety/Judgement: Decreased awareness of safety Awareness: Emergent Problem Solving: Slow processing;Difficulty sequencing;Requires verbal cues General Comments: improving R  inattention,  is still impulsive and lacks insight into deficits      Exercises Hand Activities Paper Clips on Paper: Right;15 reps Paper Clips on Paper Limitations: Attention to R side of paper, pt self-corrected without cues and increased tiem to add paper clips to R side. Other Exercises Other Exercises: LAQ against min PT resistance, x15, bilat Other Exercises: foot pronation and supination against min PT resistance, x10 bilat. (impaired proprioception R foot) Other Exercises: Picking up ~15 paper clips and coins with RUE and in hand manipulation to palm. Other Exercises: Stacking wascloths unfolded neatly, 5-10 repetitions    General Comments General comments (skin integrity, edema, etc.): Ex-husband and mother present during session      Pertinent Vitals/Pain Pain Assessment: Faces Faces Pain Scale: No hurt Pain Intervention(s): Monitored during session    Home Living                      Prior Function            PT Goals (current goals can now be found in the care plan section) Acute Rehab PT Goals Patient Stated Goal: to get better and take care of her mom PT Goal Formulation: With patient Time For Goal Achievement: 12/28/20 Potential to Achieve Goals: Good Progress towards PT goals: Progressing toward goals    Frequency    Min 4X/week      PT Plan Current plan remains appropriate    Co-evaluation              AM-PAC PT "6 Clicks" Mobility   Outcome Measure  Help needed turning from your back to your side while in a flat bed without using bedrails?: A Little Help needed moving from lying on your back to sitting on the side of a flat bed without using bedrails?: A Little Help needed moving to and from a bed to a chair (including a wheelchair)?: A Little Help needed standing up from a chair using your arms (e.g., wheelchair or bedside chair)?: A Little Help needed to walk in hospital room?: A Little Help needed climbing 3-5 steps with a  railing? : A Little 6 Click Score: 18    End of Session   Activity Tolerance: Patient tolerated treatment well Patient left: in chair;with chair alarm set;with call bell/phone within reach;with family/visitor present Nurse Communication: Mobility status PT Visit Diagnosis: Other abnormalities of gait and mobility (R26.89);Other symptoms and signs involving the nervous system (R29.898)     Time: [id number] PT Time Calculation (min) (ACUTE ONLY): 19 min  Charges:  $Gait Training: 8-22 mins                     Stacie Glaze, PT DPT Acute Rehabilitation Services Pager [phone]  Office [phone]    [place] 12/18/2020, 4:50 PM

## 2020-12-18 NOTE — NC FL2 (Addendum)
[place] LEVEL OF CARE SCREENING TOOL     IDENTIFICATION  Patient Name: Teresa Galloway Birthdate: 1956-03-15 Sex: female Admission Date (Current Location): 12/13/2020  [facility] [place] and [place] Number:  [place] (Simultaneous filing. User may not have seen previous data.)   Facility and Address:  [facility], [place] [address], [place], [place] [address] (Simultaneous filing. User may not have seen previous data.)      Provider Number: [id number] (Simultaneous filing. User may not have seen previous data.)  Attending Physician Name and Address:  Luanne Bras, MD  Relative Name and Phone Number:       Current Level of Care: Hospital (Simultaneous filing. User may not have seen previous data.) Recommended Level of Care: [place] (Simultaneous filing. User may not have seen previous data.) Prior Approval Number:    Date Approved/Denied:   PASRR Number: pending  Discharge Plan: SNF (Simultaneous filing. User may not have seen previous data.)    Current Diagnoses: Patient Active Problem List   Diagnosis Date Noted   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   Brain aneurysm 12/13/2020   Bipolar II disorder ([place])    Obstructive sleep apnea    Hx of lithium toxicity    Irritable bowel syndrome 11/28/2017   Dyspareunia due to medical condition in female patient 12/19/2015   Atrophic vaginitis 12/19/2015   Pain in the chest    Acute pericarditis    Sinus tachycardia    Pericardial effusion, acute 01/03/2014   Hypothyroid    Hx of migraines    Generalized anxiety disorder with panic attacks    Dyslipidemia    Major depressive disorder 04/16/2013    Orientation RESPIRATION BLADDER Height & Weight     Self, Time, Situation, Place (Simultaneous filing. User may not have seen previous data.)  Normal Continent Weight: 134 lb (60.8 kg) Height:  '5\' 6"'$  (167.6 cm)  BEHAVIORAL SYMPTOMS/MOOD NEUROLOGICAL BOWEL  NUTRITION STATUS      Continent Diet (see dc summary)  AMBULATORY STATUS COMMUNICATION OF NEEDS Skin   Limited Assist Verbally Normal                       Personal Care Assistance Level of Assistance  Bathing, Feeding, Dressing (Simultaneous filing. User may not have seen previous data.) Bathing Assistance: Limited assistance Feeding assistance: Independent Dressing Assistance: Limited assistance     Functional Limitations Info             SPECIAL CARE FACTORS FREQUENCY  PT (By licensed PT), OT (By licensed OT)     PT Frequency: 5x/week OT Frequency: 5x/week            Contractures Contractures Info: Not present    Additional Factors Info  Code Status, Allergies, Psychotropic Code Status Info: Full Allergies Info: Other, Sulfa Antibiotics, Sunscreens, Lamictal (Lamotrigine), Seroquel (Quetiapine), Tape Psychotropic Info: Lyrica, Cymbalta         Current Medications (12/18/2020):  This is the current hospital active medication list Current Facility-Administered Medications  Medication Dose Route Frequency Provider Last Rate Last Admin    stroke: mapping our early stages of recovery book   Does not apply Once Amie [place], MD       0.9 %  sodium chloride infusion   Intravenous Continuous Luanne Bras, MD   Stopped at 12/17/20 0801   acetaminophen (TYLENOL) tablet 650 mg  650 mg Oral Q6H PRN Luanne Bras, MD   650 mg at 12/15/20  [id number]   ALPRAZolam Duanne Moron) tablet 0.25 mg  0.25 mg Oral QID PRN Han, Aimee H, PA-C   0.25 mg at 12/17/20 2110   aspirin chewable tablet 81 mg  81 mg Oral Daily Deveshwar, Willaim Rayas, MD   81 mg at 12/18/20 1042   Or   aspirin chewable tablet 81 mg  81 mg Per Tube Daily Deveshwar, Sanjeev, MD       atorvastatin (LIPITOR) tablet 40 mg  40 mg Oral Daily Rosalin Hawking, MD   40 mg at 12/18/20 1042   baclofen (LIORESAL) tablet 5 mg  5 mg Oral TID Han, Aimee H, PA-C   5 mg at 12/17/20 2110   Chlorhexidine Gluconate Cloth 2 % PADS 6 each   6 each Topical Daily Deveshwar, Willaim Rayas, MD   6 each at 12/16/20 1244   clopidogrel (PLAVIX) tablet 75 mg  75 mg Oral Daily Deveshwar, Willaim Rayas, MD   75 mg at 12/18/20 1042   Or   clopidogrel (PLAVIX) tablet 75 mg  75 mg Per Tube Daily Deveshwar, Sanjeev, MD       dicyclomine (BENTYL) capsule 10 mg  10 mg Oral TID PRN Han, Aimee H, PA-C       DULoxetine (CYMBALTA) DR capsule 60 mg  60 mg Oral Daily Han, Aimee H, PA-C   60 mg at 12/18/20 1042   EPINEPHrine (EPI-PEN) injection 0.3 mg  0.3 mg Intramuscular PRN Han, Aimee H, PA-C       irbesartan (AVAPRO) tablet 75 mg  75 mg Oral Daily Han, Aimee H, PA-C   75 mg at 12/16/20 0932   levothyroxine (SYNTHROID) tablet 75 mcg  75 mcg Oral Q0600 Han, Aimee H, PA-C   75 mcg at 12/18/20 [id number]   lip balm (CARMEX) ointment   Topical PRN Luanne Bras, MD       ondansetron (ZOFRAN-ODT) disintegrating tablet 4 mg  4 mg Oral Q8H PRN Han, Aimee H, PA-C   4 mg at 12/14/20 1028   pantoprazole (PROTONIX) EC tablet 40 mg  40 mg Oral Daily Han, Aimee H, PA-C   40 mg at 12/18/20 1042   polyvinyl alcohol (LIQUIFILM TEARS) 1.4 % ophthalmic solution 1 drop  1 drop Both Eyes TID PRN [place] Dy, Dwayne A, RPH       pregabalin (LYRICA) capsule 150 mg  150 mg Oral BID Han, Aimee H, PA-C   150 mg at 12/18/20 1042   propranolol (INDERAL) tablet 60 mg  60 mg Oral Daily Han, Aimee H, PA-C   60 mg at 12/16/20 0933   SUMAtriptan (IMITREX) tablet 25 mg  25 mg Oral Q2H PRN Joselyn Glassman A, RPH   25 mg at 12/14/20 2329     Discharge Medications: Please see discharge summary for a list of discharge medications.  Relevant Imaging Results:  Relevant Lab Results:   Additional Information SSN: [id number]. Moderna COVID-19 Vaccine 07/28/2019 , 06/25/2019. Booster on 02/29/20  Benard Halsted, LCSW

## 2020-12-18 NOTE — TOC Progression Note (Signed)
Transition of Care [facility]) - Progression Note    Patient Details  Name: CEYDA MACMULLEN MRN: [id number] Date of Birth: 1955-05-22  Transition of [facility]) CM/SW [place], LCSW Phone Number: 12/18/2020, 3:42 PM  Clinical Narrative:    CSW provided patient with SNF bed offers and both [facility] facilities can accept her. She contacted her mother and has selected [facility]. CSW started insurance authorization with [employer]. Patient's pasrr is pending; clinicals uploaded.    Expected Discharge Plan: Skilled Nursing Facility Barriers to Discharge: [place] (PASRR), Insurance Authorization  Expected Discharge Plan and Services Expected Discharge Plan: [place] In-house Referral: Clinical Social Work   Post Acute Care Choice: [place] Living arrangements for the past 2 months: Single Family Home                                       Social Determinants of Health (SDOH) Interventions    Readmission Risk Interventions No flowsheet data found.

## 2020-12-18 NOTE — Progress Notes (Signed)
Occupational Therapy Treatment Patient Details Name: Teresa Galloway MRN: [id number] DOB: 19-Sep-1955 Today's Date: 12/18/2020   History of present illness 65 yo female s/p 4-vessel cerebral arteriogram followed by embolization of wide neck lobulated L MCA aneurysm on 9/7, post-op incisional groin bleeding and word finding difficulty. 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 PMH includes acute pericarditis 2015, bipolar II disorder, anxiety, MDD.   OT comments  Pt with progress towards goals this session. Pt with increased fine motor coordination, functional grip strength, awareness/functional use of RUE, executive functioning, safety/judgement, and reported increased vision from initial evaluation. Pt very motivated and reports completing RUE hand activities over the weekend. Educated on additional RUE activities to complete while she is in the hospital, see exercises below. Pt completed grooming at sink and functional mobility with supervision. Changing recommendation to SNF rehab due to limited caregiver support, insurance denying CIR, and pt would benefit from post-acute rehab to address occupational performance problems. Will continue to follow in the acute setting.     Recommendations for follow up therapy are one component of a multi-disciplinary discharge planning process, led by the attending physician.  Recommendations may be updated based on patient status, additional functional criteria and insurance authorization.    Follow Up Recommendations  SNF   Equipment Recommendations  3 in 1 bedside commode    Recommendations for Other Services      Precautions / Restrictions Precautions Precautions: Fall Restrictions Weight Bearing Restrictions: No       Mobility Bed Mobility    General bed mobility comments: up in  chair upon OT arrival to room.    Transfers Overall transfer level: Needs assistance Equipment used: None Transfers: Sit to/from Stand Sit to Stand: Min guard         General transfer comment: Min guard for safety with sit<>stand    Balance Overall balance assessment: Needs assistance Sitting-balance support: Feet supported Sitting balance-Leahy Scale: Good     Standing balance support: No upper extremity supported;During functional activity Standing balance-Leahy Scale: Fair Standing balance comment: Pt static standing balance is good, dynamic standing balance is fair/good. Pt able to maintain good standing balance with no LOBs during hand washing at sink and disposing of paper towel                           ADL either performed or assessed with clinical judgement   ADL Overall ADL's : Needs assistance/impaired     Grooming: Supervision/safety;Wash/dry hands;Standing Grooming Details (indicate cue type and reason): Pt needed supervision for safety standing at sink.                 Toilet Transfer: Min guard (Simualted at [employer]) Armed forces technical officer Details (indicate cue type and reason): Min guard for safety with sit<>stand from chair         Functional mobility during ADLs: Min guard General ADL Comments: Pt with increased RUE awareness, functional use, functional strength, and coordination. Pt also with increased ability to motor plan during grooming this session evident by pt completing hand washing with no assistance.     Vision   Vision Assessment?: Vision impaired- to be further tested in functional context;Yes Alignment/Gaze Preference: Within Defined Limits Visual Fields: Right visual field deficit (Pt with possible Right upper peripheral visual field cut) Additional Comments: Reports "shiny light" is  no longer present and that vision is better this session. Pt with possible Right upper peripheral visual field cut noticed during visual field  testing.   Perception     Praxis      Cognition Arousal/Alertness: Awake/alert Behavior During Therapy: WFL for tasks assessed/performed;Impulsive Overall Cognitive Status: Impaired/Different from baseline Area of Impairment: Safety/judgement;Awareness;Problem solving;Attention                   Current Attention Level: Sustained   Following Commands: Follows multi-step commands inconsistently Safety/Judgement: Decreased awareness of safety Awareness: Emergent Problem Solving: Slow processing;Difficulty sequencing;Requires verbal cues General Comments: Pt with increased R side attention, use of R UE, problem solving, safety/judgement, and awareness of deficits this session. Pt with continued impulsiveness and still requiring min verbal cues to attend to R side.        Exercises Exercises: Other exercises;Hand activities Hand Activities Paper Clips on Paper: Right;15 reps Paper Clips on Paper Limitations: Attention to R side of paper, pt self-corrected without cues and increased tiem to add paper clips to R side. Other Exercises Other Exercises: Placing paper clips in straw-insert of cup. 15 repetitions; RUE Other Exercises: Placing assorted coins in straw-insert of cup; 15 repetitions; RUE Other Exercises: Picking up ~15 paper clips and coins with RUE and in hand manipulation to palm. Other Exercises: Stacking wascloths unfolded neatly, 5-10 repetitions Other Exercises: Ball toss; 15x; dropping 5/15 times with fatigue.   Shoulder Instructions       General Comments Ex-husband and mother present during session. Pt, mother , and therapist having lengthy conversation about post-d/c plan and outpatient vs SNF rehab. Pt and mother reporting limited support at home and mother reporting anxiety that she will not be able to provide the assistance that the pt needs.    Pertinent Vitals/ Pain       Pain Assessment: No/denies pain Faces Pain Scale: No hurt  Home Living                                           Prior Functioning/Environment              Frequency  Min 2X/week        Progress Toward Goals  OT Goals(current goals can now be found in the care plan section)  Progress towards OT goals: Progressing toward goals  Acute Rehab OT Goals Patient Stated Goal: to get better and take care of her mom OT Goal Formulation: With patient Time For Goal Achievement: 12/28/20 Potential to Achieve Goals: Good ADL Goals Pt Will Perform Eating: with set-up;sitting Pt Will Perform Grooming: with set-up;with supervision;sitting Pt Will Perform Upper Body Bathing: with set-up;with supervision;sitting Pt Will Perform Lower Body Bathing: with min guard assist Pt Will Transfer to Toilet: with supervision;ambulating Additional ADL Goal #1: Pt will demonstrate anticipatory awareness during ADL tasks  Plan Discharge plan remains appropriate    Co-evaluation                 AM-PAC OT "6 Clicks" Daily Activity     Outcome Measure   Help from another person eating meals?: A Little Help from another person taking care of personal grooming?: A Little Help from another person toileting, which includes using toliet, bedpan, or urinal?: A Little Help from another person bathing (including washing, rinsing, drying)?: A Little Help from another person to put on and taking off  regular upper body clothing?: A Little Help from another person to put on and taking off regular lower body clothing?: A Little 6 Click Score: 18    End of Session Equipment Utilized During Treatment: Gait belt  OT Visit Diagnosis: Unsteadiness on feet (R26.81);Other abnormalities of gait and mobility (R26.89);Apraxia (R48.2);Low vision, both eyes (H54.2);Other symptoms and signs involving cognitive function;Dizziness and giddiness (R42);Pain   Activity Tolerance Patient tolerated treatment well   Patient Left in chair;with call bell/phone within reach;with chair  alarm set;with family/visitor present   Nurse Communication Mobility status        Time: [id number] OT Time Calculation (min): 31 min  Charges: OT General Charges $OT Visit: 1 Visit OT Treatments $Self Care/Home Management : 8-22 mins $Therapeutic Activity: 8-22 mins  Jackquline Denmark, OTS Acute Rehab Office: [phone]   Ankith [place] 12/18/2020, 4:18 PM

## 2020-12-18 NOTE — Progress Notes (Addendum)
Chief Complaint: Patient was seen today for post [facility] aneurysm intervention   Supervising Physician: Luanne Bras  Patient Status: [facility] - In-pt  Subjective: Continues to improve daily.  Now with extensive R flank bruising.  Mild tenderness, no hematoma noted.  Groin stable.  No new oozing/bleeding.   Vascular US negative for complication.  Reports no one to be home with her this week.   Objective: Physical Exam: BP 108/64   Pulse 71   Temp (!) 96.4 F (35.8 C) (Oral) Comment: pt eating/drinking; RN Judson Roch aware  Resp 16   Ht '5\' 6"'$  (1.676 m)   Wt 134 lb (60.8 kg)   SpO2 99%   BMI 21.63 kg/m  NAD, alert Neuro: alert, oriented, aphasia improved, face symmetric, tongue midline, improved ataxia, ambulating with PT.  Vision improved, denies visual field deficits.  Skin: (R)groin soft, NT, small ecchymosis which appears to be healing.  Scattered small bruising to arms and legs new since admission.  New large right flank bruise marked with skin marker by staff without evidence of extension.  Feet warm   Current Facility-Administered Medications:     stroke: mapping our early stages of recovery book, , Does not apply, Once, Amie Portland, MD   0.9 %  sodium chloride infusion, , Intravenous, Continuous, Deveshwar, Sanjeev, MD, Stopped at 12/17/20 0801   acetaminophen (TYLENOL) tablet 650 mg, 650 mg, Oral, Q6H PRN, Luanne Bras, MD, 650 mg at 12/15/20 0513   ALPRAZolam (XANAX) tablet 0.25 mg, 0.25 mg, Oral, QID PRN, Han, Aimee H, PA-C, 0.25 mg at 12/17/20 2110   aspirin chewable tablet 81 mg, 81 mg, Oral, Daily, 81 mg at 12/18/20 1042 **OR** aspirin chewable tablet 81 mg, 81 mg, Per Tube, Daily, Deveshwar, Sanjeev, MD   atorvastatin (LIPITOR) tablet 40 mg, 40 mg, Oral, Daily, Rosalin Hawking, MD, 40 mg at 12/18/20 1042   baclofen (LIORESAL) tablet 5 mg, 5 mg, Oral, TID, Han, Aimee H, PA-C, 5 mg at 12/17/20 2110   Chlorhexidine Gluconate Cloth 2 % PADS 6 each, 6 each,  Topical, Daily, Deveshwar, Sanjeev, MD, 6 each at 12/16/20 1244   clopidogrel (PLAVIX) tablet 75 mg, 75 mg, Oral, Daily, 75 mg at 12/18/20 1042 **OR** clopidogrel (PLAVIX) tablet 75 mg, 75 mg, Per Tube, Daily, Deveshwar, Sanjeev, MD   dicyclomine (BENTYL) capsule 10 mg, 10 mg, Oral, TID PRN, Han, Aimee H, PA-C   DULoxetine (CYMBALTA) DR capsule 60 mg, 60 mg, Oral, Daily, Han, Aimee H, PA-C, 60 mg at 12/18/20 1042   EPINEPHrine (EPI-PEN) injection 0.3 mg, 0.3 mg, Intramuscular, PRN, Han, Aimee H, PA-C   irbesartan (AVAPRO) tablet 75 mg, 75 mg, Oral, Daily, Han, Aimee H, PA-C, 75 mg at 12/16/20 0932   levothyroxine (SYNTHROID) tablet 75 mcg, 75 mcg, Oral, Q0600, Han, Aimee H, PA-C, 75 mcg at 12/18/20 [id number]   lip balm (CARMEX) ointment, , Topical, PRN, Deveshwar, Sanjeev, MD   ondansetron (ZOFRAN-ODT) disintegrating tablet 4 mg, 4 mg, Oral, Q8H PRN, Han, Aimee H, PA-C, 4 mg at 12/14/20 1028   pantoprazole (PROTONIX) EC tablet 40 mg, 40 mg, Oral, Daily, Han, Aimee H, PA-C, 40 mg at 12/18/20 1042   polyvinyl alcohol (LIQUIFILM TEARS) 1.4 % ophthalmic solution 1 drop, 1 drop, Both Eyes, TID PRN, Pierce, Dwayne A, RPH   pregabalin (LYRICA) capsule 150 mg, 150 mg, Oral, BID, Han, Aimee H, PA-C, 150 mg at 12/18/20 1042   propranolol (INDERAL) tablet 60 mg, 60 mg, Oral, Daily, Han, Aimee H, PA-C, 60 mg at  12/16/20 0933   SUMAtriptan (IMITREX) tablet 25 mg, 25 mg, Oral, Q2H PRN, Joselyn Glassman A, RPH, 25 mg at 12/14/20 2329  Labs: CBC No results for input(s): WBC, HGB, HCT, PLT in the last 72 hours.  BMET No results for input(s): NA, K, CL, CO2, GLUCOSE, BUN, CREATININE, CALCIUM in the last 72 hours.  LFT No results for input(s): PROT, ALBUMIN, AST, ALT, ALKPHOS, BILITOT, BILIDIR, IBILI, LIPASE in the last 72 hours. PT/INR No results for input(s): LABPROT, INR in the last 72 hours.    Studies/Results: VAS [place] GROIN PSEUDOANEURYSM  Result Date: 12/17/2020  ARTERIAL PSEUDOANEURYSM  Patient Name:   RUWAIDA WILDING  Date of Exam:   12/17/2020 Medical Rec #: [id number]       Accession #:    [id number] Date of Birth: 01-Sep-1955        Patient Gender: F Patient Age:   65 years Exam Location:  [facility] Procedure:      VAS [place] GROIN PSEUDOANEURYSM Referring Phys: Luanne Bras --------------------------------------------------------------------------------  Exam: Right groin Indications: Patient complains of bruising (thigh and flank) and groin pain. History: Status post cerebral arteriogram followed by embolization of wide neck lobulated Lt MCA aneurysm. Performing Technologist: Sharion Dove RVS  Examination Guidelines: A complete evaluation includes B-mode imaging, spectral Doppler, color Doppler, and power Doppler as needed of all accessible portions of each vessel. Bilateral testing is considered an integral part of a complete examination. Limited examinations for reoccurring indications may be performed as noted.  Summary: No evidence of pseudoaneurysm, AVF or DVT    --------------------------------------------------------------------------------    Preliminary     Assessment/Plan: S/p embolization of wide neck lobulated Lt MCA aneurysm at origin of ant temp branch with a 2.75 mm x 66m pipeline shield floe diverter device. Post procedure aphasia, visual deficits, ataxia improved over the weekend.  Patient nearing baseline, but lives at home alone.  PT/OT/CIR involved in therapy needs prior to d/c home. New recommendation is for short term d/c to SNF.  Continues 81 mg ASA and Plavix 75 mg PO daily.  P2Y12 ordered due to new onset bruising.      LOS: 5 days   I spent a total of 30 minutes in face to face in clinical consultation, greater than 50% of which was counseling/coordinating care for (L) MCA aneurysm   KDocia BarrierPA-C 12/18/2020 2:57 PM

## 2020-12-18 NOTE — TOC Initial Note (Signed)
Transition of Care [facility]) - Initial/Assessment Note    Patient Details  Name: Teresa Galloway MRN: [id number] Date of Birth: [birth year]  Transition of Care [facility]) CM/SW Contact:    Benard Halsted, LCSW Phone Number: 12/18/2020, 3:42 PM  Clinical Narrative:                 CSW received consult for possible SNF placement at time of discharge. CSW spoke with patient. Patient reported that patient's family is currently unable to care for patient at their home given patient's current physical needs and fall risk. Patient expressed understanding of PT recommendation and is agreeable to SNF placement at time of discharge. Patient reports preference for s facility in [place]. CSW discussed insurance authorization process and will provide Medicare SNF ratings list. Patient has received 3 COVID vaccines. Patient expressed being hopeful for rehab and to feel better soon. No further questions reported at this time.   Skilled Nursing Rehab Facilities-   [email or web address] Ratings out of 5 possible    Name Address  Phone # [place] Inspection Overall  [facility] [address] [place] '5 1 4 4  '$ [facility]  [address], [place] [phone] '3 1 5 4  '$ [facility], [place] '3 1 1 1  '$ [place], [place] '2 2 4 4  '$ [facility] [address], [place] '3 1 2 1  '$ [place] '3 2 4 4  '$ [facility] [address], [place] '5 1 2 2  '$ [facility] [address] [place] '5 2 2 3  '$ [facility] [place], [address] [place] [phone] '5 1 2 2  '$ [facility] [phone], [phone] '5 1 2 2  '$ [facility] [address], [facility] [phone] '5 1 2 2  '$ [facility] [address] [facility].  [place] '3 1 1 1     '$ Expected Discharge Plan: Skilled Nursing Facility Barriers to Discharge: [place] [address]), Insurance Authorization   Patient Goals and CMS Choice Patient states their goals for this hospitalization and ongoing recovery are:: Rehab CMS Medicare.gov Compare Post Acute Care list provided to:: Patient Choice offered to / list presented to : Patient  Expected Discharge Plan and Services Expected Discharge Plan: [place] In-house Referral: Clinical Social Work   Post Acute Care Choice: [place] Living arrangements for the past 2 months: [place]                                      Prior Living Arrangements/Services Living arrangements for the past 2 months: Single Family Home Lives with:: Self Patient language and need for interpreter reviewed:: Yes Do you feel safe going back to the place where you live?: Yes      Need for Family Participation in Patient Care: Yes (Comment) Care giver support system in place?: Yes (comment)   Criminal Activity/Legal Involvement Pertinent to Current Situation/Hospitalization: No - Comment as needed  Activities of Daily Living Home Assistive Devices/Equipment: Eyeglasses, Scales, Blood pressure cuff ADL Screening (condition at time of admission) Patient's cognitive ability adequate to safely complete daily activities?: Yes Is the patient deaf or have difficulty hearing?: No Does the patient have difficulty seeing, even when wearing glasses/contacts?:  No Does the patient have difficulty concentrating, remembering, or making decisions?: Yes (pt reports that she does have some difficulty remembering) Patient able to express need for assistance with ADLs?: Yes Does the patient have difficulty dressing or bathing?: No Independently performs ADLs?: Yes (appropriate for developmental age) Does the patient have difficulty walking or climbing  stairs?: No Weakness of Legs: None Weakness of Arms/Hands: None  Permission Sought/Granted Permission sought to share information with : Facility Art therapist granted to share information with : Yes, Verbal Permission Granted     Permission granted to share info w AGENCY: SNFs        Emotional Assessment Appearance:: Appears stated age Attitude/Demeanor/Rapport: Engaged Affect (typically observed): Accepting, Appropriate, Pleasant Orientation: : Oriented to Self, Oriented to Place, Oriented to  Time, Oriented to Situation Alcohol / Substance Use: Not Applicable Psych Involvement: No (comment)  Admission diagnosis:  Brain aneurysm [I67.1] Patient Active Problem List   Diagnosis Date Noted   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   Brain aneurysm 12/13/2020   Bipolar II disorder ([place])    Obstructive sleep apnea    Hx of lithium toxicity    Irritable bowel syndrome 11/28/2017   Dyspareunia due to medical condition in female patient 12/19/2015   Atrophic vaginitis 12/19/2015   Pain in the chest    Acute pericarditis    Sinus tachycardia    Pericardial effusion, acute 01/03/2014   Hypothyroid    Hx of migraines    Generalized anxiety disorder with panic attacks    Dyslipidemia    Major depressive disorder 04/16/2013   PCP:  Glenda Chroman, MD Pharmacy:   [place] [place] [address] Phone: [phone] Fax: [place] [address] [place] [address] [place] [address] [place] [address] [place] [address] Phone: [phone] Fax: [phone]     Social Determinants of Health (SDOH) Interventions    Readmission Risk Interventions No flowsheet data found.

## 2020-12-18 NOTE — Progress Notes (Signed)
Inpatient Rehab Admissions Coordinator:   Spoke with patient at bedside and reviewed benefits for [employer] for CIR versus SNF.  At this time, pt is wanting to pursue SNF due to cost for rehab.  I will let TOC team know so they can follow up and help set that up.  Rehab MD met with patient this morning and feels if she has 24/7 support she could discharge home at this time, but unfortunately that is not available at this time.   Shann Medal, PT, DPT Admissions Coordinator [phone] 12/18/20  12:32 PM

## 2020-12-19 ENCOUNTER — Inpatient Hospital Stay (HOSPITAL_COMMUNITY): Payer: No Typology Code available for payment source

## 2020-12-19 DIAGNOSIS — I724 Aneurysm of artery of lower extremity: Secondary | ICD-10-CM

## 2020-12-19 LAB — CBC
HCT: 35.2 % — ABNORMAL LOW (ref 36.0–46.0)
Hemoglobin: 12.1 g/dL (ref 12.0–15.0)
MCH: 30.9 pg (ref 26.0–34.0)
MCHC: 34.4 g/dL (ref 30.0–36.0)
MCV: 90 fL (ref 80.0–100.0)
Platelets: 301 10*3/uL (ref 150–400)
RBC: 3.91 MIL/uL (ref 3.87–5.11)
RDW: 13.3 % (ref 11.5–15.5)
WBC: 7.8 10*3/uL (ref 4.0–10.5)
nRBC: 0 % (ref 0.0–0.2)

## 2020-12-19 MED ORDER — CLOPIDOGREL BISULFATE 75 MG PO TABS
37.5000 mg | ORAL_TABLET | Freq: Every day | ORAL | Status: DC
  Administered 2020-12-20 – 2020-12-22 (×3): 37.5 mg via ORAL
  Filled 2020-12-19 (×3): qty 1

## 2020-12-19 NOTE — Progress Notes (Addendum)
Right groin pseudoaneurysm study reveals hematoma. P2Y12 level received from [employer] (via phone call) is 16.  Plavix dose will be reduced to 37.5 mg daily. Orders placed for right groin pressure dressing and bedrest with bathroom privileges. Patient will not be discharged to SNF today (should insurance be approved) and NIR will reassess patient tomorrow morning. We will plan to recheck P2Y12 in 5 days.   Please call IR with any questions.  Soyla Dryer, [place] [phone] 12/19/2020, 12:53 PM

## 2020-12-19 NOTE — Progress Notes (Signed)
  Speech Language Pathology Treatment: Cognitive-Linquistic  Patient Details Name: Teresa Galloway MRN: [id number] DOB: Apr 17, 1955 Today's Date: 12/19/2020 Time: [id number] SLP Time Calculation (min) (ACUTE ONLY): 13 min  Assessment / Plan / Recommendation Clinical Impression  Pt was seen for therapy targeting expressive/receptive aphasia and cognition. SLP observed no motor speech deficits. Pt awareness intact and oriented x4. Pt reports memory loss of recent SLP evaluation (9/8) but overall rapid improvement since beginning of stay. SLP observed pt using cell phone to read and write text messages. Pt reports needing to 'refamiliarize' herself with typing on the phone, as it feels like she has never done it before but notes her ability to use the keyboard comes back quickly. SLP noted no receptive language deficits and pt successfully followed one- and two-step simple directions independently with 100% accuracy, showing a 90% increase since evaluation. Pt reports biggest remaining impairment is word finding which increases in severity when emotional and higher level executive functioning such as organizing for tasks. SLP educated pt and family on techniques for word finding such as using associated words or phonemic cues and speech and cognitive therapy treatments that may be available in next venue of care. SLP determined pt is currently functional for current venue of care and pt will be discharged. All remaining needs can be addressed at next venue.    HPI HPI: Teresa Galloway is a 65 y.o. female admitted for aneurysm management s/p complicated surgery with new neurologic changes.  Pt has medical history of hypertension, hypothyroidism, acute pericarditis pericardial effusion 2015, MDD, bipolar disorder, migraines.  MRI 9/8: "1. Subtle scattered small volume acute left MCA distribution infarcts as above. No associated mass effect. 2. Single new focus of susceptibility artifact involving the left frontal  lobe, of uncertain significance, with no definite evidence for intracranial hemorrhage on corresponding sequences. Correlation with dedicated head CT suggested to ensure no underlying small parenchymal bleed at this location. 3. Underlying mild-to-moderate chronic microvascular ischemic disease, stable.      SLP Plan  All goals met       Recommendations                   Oral Care Recommendations: Oral care BID Follow up Recommendations: Outpatient SLP SLP Visit Diagnosis: Aphasia (R47.01);Cognitive communication deficit [phone]) Plan: All goals met       GO             Dewitt Rota, SLP-Student    Dewitt Rota 12/19/2020, 12:17 PM

## 2020-12-19 NOTE — Progress Notes (Signed)
Right groin pseudoaneurysm ultrasound study completed.  Preliminary results relayed to [place], NP.  See CV Proc for preliminary results report.   Darlin Coco, RDMS, RVT

## 2020-12-19 NOTE — TOC Progression Note (Addendum)
Transition of Care [facility]) - Progression Note    Patient Details  Name: Teresa Galloway MRN: [id number] Date of Birth: [birth year]  Transition of Care [facility]) CM/SW [place] Phone Number: 12/19/2020, 3:23 PM  Clinical Narrative:     PSARR remains under review  Updated patient- received insurance auth but waiting on state approval.   Thurmond Butts, MSW, LCSW Clinical Social Worker    Expected Discharge Plan: Skilled Nursing Facility Barriers to Discharge: [place] ([place]), Insurance Authorization  Expected Discharge Plan and Services Expected Discharge Plan: [place] In-house Referral: Clinical Social Work   Post Acute Care Choice: [place] Living arrangements for the past 2 months: Single Family Home                                       Social Determinants of Health (SDOH) Interventions    Readmission Risk Interventions No flowsheet data found.

## 2020-12-19 NOTE — Progress Notes (Signed)
Referring Physician(s): Dr. Pleas Koch  Supervising Physician: Luanne Bras  Patient Status:  [facility] - In-pt  Chief Complaint: Left MCA aneurysm s/p 4 vessel arteriogram and aneurysm embolization with a 2.75 mm x 12 mm pipeline shield flow diverter device.   Subjective: Patient states she feels "almost normal" but is complaining about increasing pain/tenderness to her right groin. Extensive bruising noted along with palpable hard, tender knot.   Allergies: Other, Sulfa antibiotics, Sunscreens, Lamictal [lamotrigine], Seroquel [quetiapine], and Tape  Medications: Prior to Admission medications   Medication Sig Start Date End Date Taking? Authorizing Provider  ALPRAZolam (XANAX) 0.25 MG tablet Take 0.25 mg by mouth 4 (four) times daily as needed for anxiety.    Yes [provider]  aspirin EC 81 MG tablet Take 81 mg by mouth daily. Swallow whole.   Yes [provider]  Baclofen 5 MG TABS Take 5 mg by mouth 3 (three) times daily. Patient taking differently: Take 5 mg by mouth in the morning and at bedtime. 11/07/20  Yes Tomi Likens, Adam R, DO  carboxymethylcellulose (REFRESH PLUS) 0.5 % SOLN Place 1 drop into both eyes 3 (three) times daily as needed (dry eyes).   Yes [provider]  clopidogrel (PLAVIX) 75 MG tablet Take 75 mg by mouth daily.   Yes [provider]  dicyclomine (BENTYL) 10 MG capsule Take 1 capsule (10 mg total) by mouth 3 (three) times daily before meals. Patient taking differently: Take 10 mg by mouth 3 (three) times daily as needed for spasms. 11/28/17  Yes Setzer, Terri L, NP  DULoxetine (CYMBALTA) 60 MG capsule Take 60 mg by mouth daily. 08/29/20  Yes [provider]  EPIPEN 2-PAK 0.3 MG/0.3ML SOAJ injection Inject 0.3 mg into the muscle as needed for anaphylaxis. 09/11/15  Yes [provider]  levothyroxine (SYNTHROID, LEVOTHROID) 75 MCG tablet Take 75 mcg by mouth daily before breakfast.   Yes [provider]  omeprazole (PRILOSEC) 20 MG capsule Take 20 mg by mouth daily.   Yes [provider]  ondansetron (ZOFRAN ODT) 4 MG disintegrating tablet Take 1 tablet (4 mg total) by mouth every 8 (eight) hours as needed for nausea or vomiting. 07/26/20  Yes Jaffe, Adam R, DO  pregabalin (LYRICA) 150 MG capsule Take 1 capsule (150 mg total) by mouth 2 (two) times daily. 07/27/20  Yes Jaffe, Adam R, DO  propranolol (INDERAL) 60 MG tablet Take 60 mg by mouth daily.   Yes [provider]  rizatriptan (MAXALT-MLT) 10 MG disintegrating tablet Take 10 mg by mouth as needed for migraine. May repeat in 2 hours if needed   Yes [provider]  valsartan (DIOVAN) 80 MG tablet Take 80 mg by mouth daily. 06/15/20  Yes [provider]  diphenhydrAMINE (BENADRYL) 25 MG tablet Take 25 mg by mouth daily as needed for allergies.    [provider]  Eyelid Cleansers (AVENOVA) 0.01 % SOLN Apply 1 application topically 2 (two) times daily as needed (eyelid cleanser).    [provider]     Vital Signs: BP (!) 122/54 (BP Location: Right Arm)   Pulse 87   Temp 98.4 F (36.9 C) (Oral)   Resp 18   Ht '5\' 6"'$  (1.676 m)   Wt 134 lb (60.8 kg)   SpO2 93%   BMI 21.63 kg/m   Physical Exam Constitutional:      General: She is not in acute distress.    Appearance: Normal appearance. She is not  ill-appearing.  HENT:     Mouth/Throat:     Mouth: Mucous membranes are moist.     Pharynx: Oropharynx is clear.  Cardiovascular:     Comments: Extensive bruising to right flank and right thigh. Right groin vascular site now with hard, tender, palpable knot.  Skin:    General: Skin is warm and dry.  Neurological:     Mental Status: She is alert and oriented to person, place, and time.    Imaging: VAS [place] GROIN PSEUDOANEURYSM  Result Date: 12/18/2020  ARTERIAL PSEUDOANEURYSM  Patient Name:  Teresa Galloway  Date of Exam:   12/17/2020 Medical Rec #: [id number]       Accession #:     [id number] Date of Birth: 1955-05-10        Patient Gender: F Patient Age:   65 years Exam [facility] Procedure:      VAS [place] GROIN PSEUDOANEURYSM Referring Phys: Luanne Bras --------------------------------------------------------------------------------  Exam: Right groin Indications: Patient complains of bruising (thigh and flank) and groin pain. History: Status post cerebral arteriogram followed by embolization of wide neck lobulated Lt MCA aneurysm. Performing Technologist: Sharion Dove RVS  Examination Guidelines: A complete evaluation includes B-mode imaging, spectral Doppler, color Doppler, and power Doppler as needed of all accessible portions of each vessel. Bilateral testing is considered an integral part of a complete examination. Limited examinations for reoccurring indications may be performed as noted.  Summary: No evidence of pseudoaneurysm, AVF or DVT  Diagnosing physician: Servando Snare MD Electronically signed by Servando Snare MD on 12/18/2020 at 11:08:51 PM.   --------------------------------------------------------------------------------    Final     Labs:  CBC: Recent Labs    12/13/20 0645 12/14/20 0642  WBC 6.2 7.6  HGB 13.3 10.4*  HCT 39.6 30.9*  PLT 225 202    COAGS: Recent Labs    12/13/20 0645  INR 0.9    BMP: Recent Labs    06/27/20 1349 12/13/20 0645 12/14/20 0642 12/15/20 0243  NA 136 139 140 142  K 4.5 3.6 2.9* 3.5  CL 101 105 110 114*  CO2 '30 27 24 25  '$ GLUCOSE 101* 98 105* 108*  BUN '20 18 8 '$ 7*  CALCIUM 9.3 8.9 7.8* 8.2*  CREATININE 0.62 0.64 0.51 0.59  GFRNONAA  --  >60 >60 >60    LIVER FUNCTION TESTS: Recent Labs    06/27/20 1349  BILITOT 0.3  AST 14  ALT 14  ALKPHOS 91  PROT 6.9  ALBUMIN 4.4    Assessment and Plan:  Left MCA aneurysm s/p 4 vessel arteriogram and aneurysm embolization with a 2.75 mm x 12 mm pipeline shield flow diverter device  Patient has improved neurologically and has been working with  physical therapy. She has pending plans for SNF placement but is awaiting state approval and  insurance approval (insurance through the [place]). A STAT vascular ultrasound has been ordered to assess for possible right groin pseudoaneurysm. Order placed for patient to remain on bedrest until further information is obtained from [place]. Patient to continue DAPT with Aspirin and Plavix.   Electronically Signed: Soyla Dryer, AGACNP-BC [phone] 12/19/2020, 9:27 AM   I spent a total of 15 Minutes at the the patient's bedside AND on the patient's hospital floor or unit, greater than 50% of which was counseling/coordinating care for L MCA aneurysm intervention

## 2020-12-19 NOTE — TOC Progression Note (Addendum)
Transition of Care [facility]) - Progression Note    Patient Details  Name: Teresa Galloway MRN: [id number] Date of Birth: 1955/08/25  Transition [facility] CM/SW [place], LCSW Phone Number: 12/19/2020, 9:24 AM  Clinical Narrative:    9:24am-CSW received call from insurance stating the case has been sent to their medical director for review. She stated the ambulance request will be denied but hopefully the SNF will be approved.  Patient's pasrr still under review by the state.   2:30pm-CSW received insurance approval for patient to go to [facility] for 7 days: # [phone]. PTAR auth# [id number].  Patient's pasrr has gone to level II in-person review due to patient's Bipolar disorder diagnosis history. SNF unable to accept patient until pasrr is completed. CSW awaits pasrr's review.   3:30pm-CSW received call from [facility] with [place] [phone]). She is unable to come assess patient until tomorrow after lunch. CSW inquired how long it would take to obtain pasrr number after that but she stated it may take about 3 days.    Expected Discharge Plan: Skilled Nursing Facility Barriers to Discharge: [facility] (PASRR), Insurance Authorization  Expected Discharge Plan and Services Expected Discharge Plan: [facility] In-house Referral: Clinical Social Work   Post Acute Care Choice: [place] Living arrangements for the past 2 months: Single Family Home                                       Social Determinants of Health (SDOH) Interventions    Readmission Risk Interventions No flowsheet data found.

## 2020-12-20 NOTE — Progress Notes (Signed)
Physical Therapy Treatment Patient Details Name: Teresa Galloway MRN: [id number] DOB: 12/26/1955 Today's Date: 12/20/2020   History of Present Illness 65 yo female s/p 4-vessel cerebral arteriogram followed by embolization of wide neck lobulated L MCA aneurysm on 9/7, post-op incisional groin bleeding and word finding difficulty. MRI brain shows Subtle scattered small volume acute left MCA distribution infarcts(frontal,temporal occipital), CT head showing no acute abnormality except for known puncate infarcts in L cerebral hemisphere. PMH includes acute pericarditis 2015, bipolar II disorder, anxiety, MDD.    PT Comments    Pt eager to mobilize upon PT arrival to room. Pt demonstrating improving activity tolerance for gait today, and tolerated min challenge to dynamic balance during gait. Pt with continued safety concerns about d/c home, especially about cooking given R inattention and lack of support at home. SNF remains appropriate for short term rehab.    Recommendations for follow up therapy are one component of a multi-disciplinary discharge planning process, led by the attending physician.  Recommendations may be updated based on patient status, additional functional criteria and insurance authorization.  Follow Up Recommendations  SNF     Equipment Recommendations  None recommended by PT    Recommendations for Other Services       Precautions / Restrictions Precautions Precautions: Fall Restrictions Weight Bearing Restrictions: No     Mobility  Bed Mobility Overal bed mobility: Needs Assistance Bed Mobility: Supine to Sit     Supine to sit: Supervision          Transfers Overall transfer level: Needs assistance   Transfers: Sit to/from Stand Sit to Stand: Supervision         General transfer comment: for safety, cues for waiting for PT. STS x2, from EOB.  Ambulation/Gait Ambulation/Gait assistance: Min guard Gait Distance (Feet): 200 Feet (x2 - standing  rest break) Assistive device: None Gait Pattern/deviations: Step-through pattern;Decreased stride length Gait velocity: decr   General Gait Details: close guard for safety, mild unsteadiness with tandem walking and horizontal head turns but improved from monday. Cues for heel-toe gait as pt with tendency for foot supination in heel strike   Stairs             Wheelchair Mobility    Modified Rankin (Stroke Patients Only) Modified Rankin (Stroke Patients Only) Pre-Morbid Rankin Score: No symptoms Modified Rankin: Moderate disability     Balance Overall balance assessment: Needs assistance Sitting-balance support: Feet supported Sitting balance-Leahy Scale: Good     Standing balance support: No upper extremity supported;During functional activity Standing balance-Leahy Scale: Fair Standing balance comment: fair to good               High Level Balance Comments: mild unsteadiness with head turns horiz/vert, tandem gait            Cognition Arousal/Alertness: Awake/alert Behavior During Therapy: WFL for tasks assessed/performed;Impulsive Overall Cognitive Status: Impaired/Different from baseline Area of Impairment: Safety/judgement;Awareness;Problem solving;Attention                   Current Attention Level: Sustained   Following Commands: Follows multi-step commands inconsistently Safety/Judgement: Decreased awareness of safety Awareness: Emergent Problem Solving: Slow processing;Difficulty sequencing;Requires verbal cues General Comments: improving R inattention      Exercises      General Comments        Pertinent Vitals/Pain Pain Assessment: Faces Faces Pain Scale: No hurt Pain Intervention(s): Monitored during session    Home Living  Prior Function            PT Goals (current goals can now be found in the care plan section) Acute Rehab PT Goals Patient Stated Goal: to get better and take care of  her mom PT Goal Formulation: With patient Time For Goal Achievement: 12/28/20 Potential to Achieve Goals: Good Progress towards PT goals: Progressing toward goals    Frequency    Min 3X/week      PT Plan Current plan remains appropriate    Co-evaluation              AM-PAC PT "6 Clicks" Mobility   Outcome Measure  Help needed turning from your back to your side while in a flat bed without using bedrails?: A Little Help needed moving from lying on your back to sitting on the side of a flat bed without using bedrails?: A Little Help needed moving to and from a bed to a chair (including a wheelchair)?: A Little Help needed standing up from a chair using your arms (e.g., wheelchair or bedside chair)?: A Little Help needed to walk in hospital room?: A Little Help needed climbing 3-5 steps with a railing? : A Little 6 Click Score: 18    End of Session   Activity Tolerance: Patient tolerated treatment well Patient left: in chair;with chair alarm set;with call bell/phone within reach Nurse Communication: Mobility status PT Visit Diagnosis: Other abnormalities of gait and mobility (R26.89);Other symptoms and signs involving the nervous system (R29.898)     Time: [id number] PT Time Calculation (min) (ACUTE ONLY): 13 min  Charges:  $Gait Training: 8-22 mins                    Stacie Glaze, PT DPT Acute Rehabilitation Services Pager [phone]  Office [phone]    Roxine Caddy E Ruffin Pyo 12/20/2020, 12:28 PM

## 2020-12-20 NOTE — Progress Notes (Addendum)
Referring Physician(s): Jaffee,A  Supervising Physician: Luanne Bras  Patient Status:  [facility] - In-pt  Chief Complaint:  Right groin tenderness/hematoma  Subjective: Pt doing fairly well this am; still has some tendeness rt groin region at site of known hematoma but decreased since yesterday; also states she still has some issues with hand dexterity/writing and occasional word finding difficulty; otherwise no acute worsening neurologically; she is doing word games/puzzles on laptop   Allergies: Other, Sulfa antibiotics, Sunscreens, Lamictal [lamotrigine], Seroquel [quetiapine], and Tape  Medications: Prior to Admission medications   Medication Sig Start Date End Date Taking? Authorizing Provider  ALPRAZolam (XANAX) 0.25 MG tablet Take 0.25 mg by mouth 4 (four) times daily as needed for anxiety.    Yes [provider]  aspirin EC 81 MG tablet Take 81 mg by mouth daily. Swallow whole.   Yes [provider]  Baclofen 5 MG TABS Take 5 mg by mouth 3 (three) times daily. Patient taking differently: Take 5 mg by mouth in the morning and at bedtime. 11/07/20  Yes Tomi Likens, Adam R, DO  carboxymethylcellulose (REFRESH PLUS) 0.5 % SOLN Place 1 drop into both eyes 3 (three) times daily as needed (dry eyes).   Yes [provider]  clopidogrel (PLAVIX) 75 MG tablet Take 75 mg by mouth daily.   Yes [provider]  dicyclomine (BENTYL) 10 MG capsule Take 1 capsule (10 mg total) by mouth 3 (three) times daily before meals. Patient taking differently: Take 10 mg by mouth 3 (three) times daily as needed for spasms. 11/28/17  Yes Setzer, Terri L, NP  DULoxetine (CYMBALTA) 60 MG capsule Take 60 mg by mouth daily. 08/29/20  Yes [provider]  EPIPEN 2-PAK 0.3 MG/0.3ML SOAJ injection Inject 0.3 mg into the muscle as needed for anaphylaxis. 09/11/15  Yes [provider]  levothyroxine (SYNTHROID, LEVOTHROID) 75 MCG tablet Take 75 mcg by mouth daily  before breakfast.   Yes [provider]  omeprazole (PRILOSEC) 20 MG capsule Take 20 mg by mouth daily.   Yes [provider]  ondansetron (ZOFRAN ODT) 4 MG disintegrating tablet Take 1 tablet (4 mg total) by mouth every 8 (eight) hours as needed for nausea or vomiting. 07/26/20  Yes Jaffe, Adam R, DO  pregabalin (LYRICA) 150 MG capsule Take 1 capsule (150 mg total) by mouth 2 (two) times daily. 07/27/20  Yes Jaffe, Adam R, DO  propranolol (INDERAL) 60 MG tablet Take 60 mg by mouth daily.   Yes [provider]  rizatriptan (MAXALT-MLT) 10 MG disintegrating tablet Take 10 mg by mouth as needed for migraine. May repeat in 2 hours if needed   Yes [provider]  valsartan (DIOVAN) 80 MG tablet Take 80 mg by mouth daily. 06/15/20  Yes [provider]  diphenhydrAMINE (BENADRYL) 25 MG tablet Take 25 mg by mouth daily as needed for allergies.    [provider]  Eyelid Cleansers (AVENOVA) 0.01 % SOLN Apply 1 application topically 2 (two) times daily as needed (eyelid cleanser).    [provider]     Vital Signs: BP 137/70 (BP Location: Left Arm)   Pulse 72   Temp 98.3 F (36.8 C) (Oral)   Resp 16   Ht '5\' 6"'$  (1.676 m)   Wt 134 lb (60.8 kg)   SpO2 96%   BMI 21.63 kg/m   Physical Exam awake/alert; chest- CTA bilat; heart- RRR; abd- soft,+BS,NT; no LE edema; intact distal pulses; rt groin/rt lat abd/flank/thigh ecchymoses noted;  rt CFA access site with small hematoma, mildly tender to palpation; face symm, tongue midline, EOMI, no drift, moving all fours ok, strength nl/symm UE/LE's; FMM nl; shoulder shrug nl  Imaging: VAS [place] GROIN PSEUDOANEURYSM  Result Date: 12/19/2020  ARTERIAL PSEUDOANEURYSM  Patient Name:  CRYSTALINA AVALLONE  Date of Exam:   12/19/2020 Medical Rec #: [id number]       Accession #:    [id number] Date of Birth: 04-08-1956        Patient Gender: F Patient Age:   65 years Exam Location:  [facility] Procedure:      VAS  [place] GROIN PSEUDOANEURYSM Referring Phys: Soyla Dryer --------------------------------------------------------------------------------  Exam: Right groin Indications: Patient complains of palpable knot, groin pain and bruising. History: S/p catheterization. Comparison Study: 12-17-2020 Groin pseudo was negative for evidence of                   pseudoaneurysm, AVF, or DVT. Performing Technologist: Darlin Coco RDMS, RVT  Examination Guidelines: A complete evaluation includes B-mode imaging, spectral Doppler, color Doppler, and power Doppler as needed of all accessible portions of each vessel. Bilateral testing is considered an integral part of a complete examination. Limited examinations for reoccurring indications may be performed as noted. +------------+----------+---------+------+----------+ Right DuplexPSV (cm/s)Waveform PlaqueComment(s) +------------+----------+---------+------+----------+ Ext.Iliac      206    triphasic                 +------------+----------+---------+------+----------+ CFA            146    triphasic                 +------------+----------+---------+------+----------+ PFA             70    triphasic                 +------------+----------+---------+------+----------+ Prox SFA        76    triphasic                 +------------+----------+---------+------+----------+ Right Vein comments: Patent CFV/SFJ.  Findings: A mixed echogenic structure measuring approximately 2.4 cm x 1.2 cm is visualized at the area of palpation/most pain with ultrasound characteristics of a hematoma.  Summary: No evidence of pseudoaneurysm, AVF or DVT  Diagnosing physician: Harold Barban MD Electronically signed by Harold Barban MD on 12/19/2020 at 7:10:14 PM.    --------------------------------------------------------------------------------    Final    VAS [place] GROIN PSEUDOANEURYSM  Result Date: 12/18/2020  ARTERIAL PSEUDOANEURYSM  Patient Name:  KETIA SCHURING  Date of Exam:    12/17/2020 Medical Rec #: [id number]       Accession #:    [id number] Date of Birth: May 01, 1955        Patient Gender: F Patient Age:   52 years Exam Location:  [facility] Procedure:      VAS [place] GROIN PSEUDOANEURYSM Referring Phys: Luanne Bras --------------------------------------------------------------------------------  Exam: Right groin Indications: Patient complains of bruising (thigh and flank) and groin pain. History: Status post cerebral arteriogram followed by embolization of wide neck lobulated Lt MCA aneurysm. Performing Technologist: Sharion Dove RVS  Examination Guidelines: A complete evaluation includes B-mode imaging, spectral Doppler, color Doppler, and power Doppler as needed of all accessible portions of each vessel. Bilateral testing is considered an integral part of a complete examination. Limited examinations for reoccurring indications may be performed as noted.  Summary: No evidence of pseudoaneurysm, AVF or DVT  Diagnosing physician: Servando Snare MD Electronically signed  by Servando Snare MD on 12/18/2020 at 11:08:51 PM.   --------------------------------------------------------------------------------    Final     Labs:  CBC: Recent Labs    12/13/20 0645 12/14/20 0642 12/19/20 1219  WBC 6.2 7.6 7.8  HGB 13.3 10.4* 12.1  HCT 39.6 30.9* 35.2*  PLT 225 202 301    COAGS: Recent Labs    12/13/20 0645  INR 0.9    BMP: Recent Labs    06/27/20 1349 12/13/20 0645 12/14/20 0642 12/15/20 0243  NA 136 139 140 142  K 4.5 3.6 2.9* 3.5  CL 101 105 110 114*  CO2 '30 27 24 25  '$ GLUCOSE 101* 98 105* 108*  BUN '20 18 8 '$ 7*  CALCIUM 9.3 8.9 7.8* 8.2*  CREATININE 0.62 0.64 0.51 0.59  GFRNONAA  --  >60 >60 >60    LIVER FUNCTION TESTS: Recent Labs    06/27/20 1349  BILITOT 0.3  AST 14  ALT 14  ALKPHOS 91  PROT 6.9  ALBUMIN 4.4    Assessment and Plan: Pt with hx HTN, hypothyroid, acute pericarditis with pericardial effusion in 2015, MDD, bipolar 2  disorder, and migraine who was referred to [facility] for possible endovascular treatment options for left MCA aneurysm which was found on MRI brain/ MRA brain on 11/27/2020; s/p endovascular treatment of lobulated wide neck left middle cerebral artery bifurcation aneurysm arising just distal to the origin of the inferior division as described above using a pipeline shield flow diverter device on 12/13/20; due to some word finding difficulty postprocedure f/u imaging on 9/8 revealed:   MRI HEAD IMPRESSION:   1. Subtle scattered small volume acute left MCA distribution infarcts as above. No associated mass effect. 2. Single new focus of susceptibility artifact involving the left frontal lobe, of uncertain significance, with no definite evidence for intracranial hemorrhage on corresponding sequences. Correlation with dedicated head CT suggested to ensure no underlying small parenchymal bleed at this location. 3. Underlying mild-to-moderate chronic microvascular ischemic disease, stable.   MRA HEAD IMPRESSION:   1. Sequelae of interval endovascular treatment of left MCA bifurcation aneurysm with pipeline flow diverter stent. Patent flow is seen through the stent with good perfusion of the left MCA branches distally. Persistent filling of the aneurysm at this time, stable in size measuring 4 x 4 x 2 mm. 2. Otherwise stable and normal intracranial MRA  Pt also had rt groin bleeding postprocedure; recent vasc US yesterday revealed no pseudoaneurysm/AVF or DVT; findings c/w hematoma; pt currently afebrile; WBC nl; hgb 12.1(10.4); neuro exam stable; still with occ word finding difficulty, some difficulty with writing easily; rt groin hematoma soft, mildly tender, no obvious enlargement or active bleeding; P2Y12 on 9/12 was 16- now on plavix 37.5 mg daily and baby aspirin; has been eval by PT/OT; SW also involved; tent plans are for d/c to SNF (short term monitoring) [facility])  once approved by state; apparently insurance auth already received; after d/c pt to f/u with Dr. Estanislado Pandy in 2 weeks    Electronically Signed: D. Rowe Robert, PA-C 12/20/2020, 9:11 AM   I spent a total of 20 minutes at the the patient's bedside AND on the patient's hospital floor or unit, greater than 50% of which was counseling/coordinating care for cerebral arteriogram with left middle cerebral artery aneurysm endovascular treatment    Patient ID: Newton Pigg, female   DOB: May 10, 1955, 65 y.o.   MRN: [id number]

## 2020-12-20 NOTE — Progress Notes (Addendum)
Occupational Therapy Treatment Patient Details Name: Teresa Galloway MRN: [id number] DOB: September 16, 1955 Today's Date: 12/20/2020   History of present illness 65 yo female s/p 4-vessel cerebral arteriogram followed by embolization of wide neck lobulated L MCA aneurysm on 9/7, post-op incisional groin bleeding and word finding difficulty. MRI brain shows Subtle scattered small volume acute left MCA distribution infarcts(frontal,temporal occipital), CT head showing no acute abnormality except for known puncate infarcts in L cerebral hemisphere. PMH includes acute pericarditis 2015, bipolar II disorder, anxiety, MDD.   OT comments  [name] is progressing well but continues to be limited by RUE apraxia, activity tolerance, fatigue, internal distraction and anxiety. Session focused on higher level visual perceptual tasks, attention and cognitive tasks which continue to require mod-max verbal cues to accomplish. Pt verbalized frustrations with sensory-motor tasks such as writing her name/address, using her phone and paying bills. Pt also required max cueing and encouragement throughout for breathing techniques and self-regulation once she became frustrated with the difficulty level of doing a table-top skill. Limitations and deficits in the above skills significantly impacts the pt's ability to go home alone, safely. Pt also verbalized concern of a emotional/aggressive outburst she had this a.m., which she stated was unlike her. Pt continues to benefit from continued OT acutely to address limitations and safety concerns in regard to IADLs. D/c updated to SNF.   Recommendations for follow up therapy are one component of a multi-disciplinary discharge planning process, led by the attending physician.  Recommendations may be updated based on patient status, additional functional criteria and insurance authorization.    Follow Up Recommendations  SNF (insure did not approve CIR; pt would benefit from SNF stay to progress  IADL tasks back to indep baseline prior to d/c home for safety)    Equipment Recommendations  None recommended by OT       Precautions / Restrictions Precautions Precautions: Fall Restrictions Weight Bearing Restrictions: No       Mobility Bed Mobility Overal bed mobility: Needs Assistance Bed Mobility: Supine to Sit     Supine to sit: Supervision     General bed mobility comments: session focused on completing table top tasks; pt supported while sitting in chair throughout. She was able to adjust and move herself as needed, good midline posture noted    Transfers Overall transfer level: Needs assistance   Transfers: Sit to/from Stand Sit to Stand: Supervision         General transfer comment: session spent completing higher level cognitive tabletop tasks    Balance Overall balance assessment: Needs assistance Sitting-balance support: Feet supported Sitting balance-Leahy Scale: Good     Standing balance support: No upper extremity supported;During functional activity Standing balance-Leahy Scale: Fair Standing balance comment: fair to good               High Level Balance Comments: mild unsteadiness with head turns horiz/vert, tandem gait           ADL either performed or assessed with clinical judgement   ADL Overall ADL's : Needs assistance/impaired     General ADL Comments: Session focused on IADL tasks. Pt requries mod-max verbal cues to complete IADL tasks such as finance mgmt. She is limited by internal distraction, activity tolerance and overall fatigue with higer level cognitive tasks     Vision   Eye Alignment: Within Functional Limits Alignment/Gaze Preference: Within Defined Limits Additional Comments: pt able to read menu and demosntrated normal L>R scanning pattern during letter cancelation task. pt reports her  vision has improved greatly. pt complete simple clock draw with great visual preceptual abilities and no errors.    Perception     Praxis      Cognition Arousal/Alertness: Awake/alert Behavior During Therapy: WFL for tasks assessed/performed;Impulsive Overall Cognitive Status: Impaired/Different from baseline Area of Impairment: Attention;Following commands;Safety/judgement;Awareness;Problem solving                   Current Attention Level: Sustained   Following Commands: Follows multi-step commands consistently Safety/Judgement: Decreased awareness of safety Awareness: Emergent;Anticipatory Problem Solving: Slow processing;Difficulty sequencing;Requires verbal cues General Comments: pt demonstrated great insight into her deficits by describing when tasks were difficult for her due sensory motor deficits and fatigue. She sustained her attention to 3 step tasks, and was able to re-direct herself after internal distraction. pt became overwheled during difficult tasks and requried verbal cues for calming techniques prior to re-attempting the task        Exercises Exercises: Other exercises Other Exercises Other Exercises: Hand writing endurance practice with number writing tasks, tracing and coloring. Other Exercises: Calming techniques to control frustration and anxiety   Shoulder Instructions       General Comments VSS on RA; pt verbalized frustration that she experienced earlier in the day and became emotional, overall pleasant and eager to participate    Pertinent Vitals/ Pain       Pain Assessment: Faces Faces Pain Scale: No hurt Pain Intervention(s): Limited activity within patient's tolerance;Monitored during session   Frequency  Min 2X/week        Progress Toward Goals  OT Goals(current goals can now be found in the care plan section)  Progress towards OT goals: Progressing toward goals  Acute Rehab OT Goals Patient Stated Goal: to get better and take care of her mom OT Goal Formulation: With patient Time For Goal Achievement: 12/28/20 Potential to Achieve  Goals: Good ADL Goals Pt Will Perform Eating: with set-up;sitting Pt Will Perform Grooming: with set-up;with supervision;sitting Pt Will Perform Upper Body Bathing: with set-up;with supervision;sitting Pt Will Perform Lower Body Bathing: with min guard assist Pt Will Transfer to Toilet: with supervision;ambulating Additional ADL Goal #1: Pt will demonstrate anticipatory awareness during ADL tasks  Plan Discharge plan needs to be updated    Co-evaluation                 AM-PAC OT "6 Clicks" Daily Activity     Outcome Measure   Help from another person eating meals?: None Help from another person taking care of personal grooming?: None Help from another person toileting, which includes using toliet, bedpan, or urinal?: A Little Help from another person bathing (including washing, rinsing, drying)?: A Little Help from another person to put on and taking off regular upper body clothing?: None Help from another person to put on and taking off regular lower body clothing?: A Little 6 Click Score: 21    End of Session    OT Visit Diagnosis: Unsteadiness on feet (R26.81);Other abnormalities of gait and mobility (R26.89);Apraxia (R48.2);Low vision, both eyes (H54.2);Other symptoms and signs involving cognitive function;Dizziness and giddiness (R42);Pain   Activity Tolerance Patient tolerated treatment well   Patient Left in chair;with call bell/phone within reach;with chair alarm set   Nurse Communication Mobility status        Time: [id number] OT Time Calculation (min): 33 min  Charges: OT General Charges $OT Visit: 1 Visit OT Treatments $Self Care/Home Management : 23-37 mins    Ernesteen Mihalic A Hyun Marsalis 12/20/2020, 3:34 PM

## 2020-12-21 LAB — SARS CORONAVIRUS 2 (TAT 6-24 HRS): SARS Coronavirus 2: NEGATIVE

## 2020-12-21 LAB — PLATELET INHIBITION P2Y12

## 2020-12-21 NOTE — TOC Progression Note (Signed)
Transition of Care [facility]) - Progression Note    Patient Details  Name: Teresa Galloway MRN: [id number] Date of Birth: May 06, 1955  Transition of Care [facility]) CM/SW [place], [place] Phone Number: 12/21/2020, 11:03 AM  Clinical Narrative:     PSARR remains pending   CSW will continue to follow and assist with discharge planning.   Thurmond Butts, MSW, LCSW Clinical Social Worker    Expected Discharge Plan: Skilled Nursing Facility Barriers to Discharge: Awaiting State Approval (PASRR), Insurance Authorization  Expected Discharge Plan and Services Expected Discharge Plan: [place] In-house Referral: Clinical Social Work   Post Acute Care Choice: [place] Living arrangements for the past 2 months: Single Family Home                                       Social Determinants of Health (SDOH) Interventions    Readmission Risk Interventions No flowsheet data found.

## 2020-12-21 NOTE — TOC Progression Note (Signed)
Transition of Care [facility]) - Progression Note    Patient Details  Name: Teresa Galloway MRN: [id number] Date of Birth: 1955-09-16  Transition of Care [facility]) CM/SW [place], [place] Phone Number: 12/21/2020, 4:52 PM  Clinical Narrative:     Verlee Monte #  [id number] F Informed [facility]  - received psarr and anticipate d/c tomorrow  RN updated and covid test requested   Thurmond Butts, MSW, LCSW Clinical Social Worker     Expected Discharge Plan: Skilled Nursing Facility Barriers to Discharge: [place] ([place]), Insurance Authorization  Expected Discharge Plan and Services Expected Discharge Plan: [place] In-house Referral: Clinical Social Work   Post Acute Care Choice: [place] Living arrangements for the past 2 months: Single Family Home                                       Social Determinants of Health (SDOH) Interventions    Readmission Risk Interventions No flowsheet data found.

## 2020-12-21 NOTE — Progress Notes (Signed)
Chief Complaint: Patient was seen today for post [facility] aneurysm intervention   Supervising Physician: Luanne Bras  Patient Status: [facility] - In-pt  Subjective: Continues to improve daily.  Stable groin and R flank bruising.  Mild tenderness, no hematoma noted.  Groin stable.  No new oozing/bleeding.   Pending SNF placement.   Objective: Physical Exam: BP (!) 120/57 (BP Location: Right Arm)   Pulse 69   Temp 98.5 F (36.9 C) (Oral)   Resp 16   Ht '5\' 6"'$  (1.676 m)   Wt 134 lb (60.8 kg)   SpO2 95%   BMI 21.63 kg/m  NAD, alert Neuro: alert, oriented, aphasia improved, face symmetric, tongue midline, improved ataxia, ambulating with PT.  Vision improved, denies visual field deficits.  Skin: (R)groin soft, NT, small ecchymosis which appears to be healing. Small, stable hematoma distal to procedure site.  Scattered small bruising to arms and legs new since admission.  New large right flank bruise marked with skin marker by staff without evidence of extension.  Feet warm.   Current Facility-Administered Medications:     stroke: mapping our early stages of recovery book, , Does not apply, Once, Amie [place], MD   0.9 %  sodium chloride infusion, , Intravenous, Continuous, Deveshwar, Sanjeev, MD, Stopped at 12/17/20 0801   acetaminophen (TYLENOL) tablet 650 mg, 650 mg, Oral, Q6H PRN, Luanne Bras, MD, 650 mg at 12/19/20 1648   ALPRAZolam (XANAX) tablet 0.25 mg, 0.25 mg, Oral, QID PRN, Han, Aimee H, PA-C, 0.25 mg at 12/19/20 1324   aspirin chewable tablet 81 mg, 81 mg, Oral, Daily, 81 mg at 12/21/20 [id number] **OR** aspirin chewable tablet 81 mg, 81 mg, Per Tube, Daily, Deveshwar, Sanjeev, MD   atorvastatin (LIPITOR) tablet 40 mg, 40 mg, Oral, Daily, Rosalin Hawking, MD, 40 mg at 12/21/20 [id number]   baclofen (LIORESAL) tablet 5 mg, 5 mg, Oral, TID, Han, Aimee H, PA-C, 5 mg at 12/21/20 0920   Chlorhexidine Gluconate Cloth 2 % PADS 6 each, 6 each, Topical, Daily, Deveshwar, Sanjeev, MD,  6 each at 12/21/20 1051   clopidogrel (PLAVIX) tablet 37.5 mg, 37.5 mg, Oral, Daily, [place], Jamie R, NP, 37.5 mg at 12/21/20 [id number]   dicyclomine (BENTYL) capsule 10 mg, 10 mg, Oral, TID PRN, Han, Aimee H, PA-C   DULoxetine (CYMBALTA) DR capsule 60 mg, 60 mg, Oral, Daily, Han, Aimee H, PA-C, 60 mg at 12/21/20 [id number]   EPINEPHrine (EPI-PEN) injection 0.3 mg, 0.3 mg, Intramuscular, PRN, Han, Aimee H, PA-C   irbesartan (AVAPRO) tablet 75 mg, 75 mg, Oral, Daily, Han, Aimee H, PA-C, 75 mg at 12/21/20 [id number]   levothyroxine (SYNTHROID) tablet 75 mcg, 75 mcg, Oral, Q0600, Han, Aimee H, PA-C, 75 mcg at 12/21/20 0506   lip balm (CARMEX) ointment, , Topical, PRN, Deveshwar, Sanjeev, MD   ondansetron (ZOFRAN-ODT) disintegrating tablet 4 mg, 4 mg, Oral, Q8H PRN, Han, Aimee H, PA-C, 4 mg at 12/14/20 1028   pantoprazole (PROTONIX) EC tablet 40 mg, 40 mg, Oral, Daily, Han, Aimee H, PA-C, 40 mg at 12/21/20 [id number]   polyvinyl alcohol (LIQUIFILM TEARS) 1.4 % ophthalmic solution 1 drop, 1 drop, Both Eyes, TID PRN, Levada Dy, Dwayne A, RPH   pregabalin (LYRICA) capsule 150 mg, 150 mg, Oral, BID, Han, Aimee H, PA-C, 150 mg at 12/21/20 0921   propranolol (INDERAL) tablet 60 mg, 60 mg, Oral, Daily, Han, Aimee H, PA-C, 60 mg at 12/21/20 0921   SUMAtriptan (IMITREX) tablet 25 mg, 25 mg, Oral, Q2H PRN, Levada Dy, Dwayne A,  RPH, 25 mg at 12/14/20 2329  Labs: CBC Recent Labs    12/19/20 1219  WBC 7.8  HGB 12.1  HCT 35.2*  PLT 301    BMET No results for input(s): NA, K, CL, CO2, GLUCOSE, BUN, CREATININE, CALCIUM in the last 72 hours.  LFT No results for input(s): PROT, ALBUMIN, AST, ALT, ALKPHOS, BILITOT, BILIDIR, IBILI, LIPASE in the last 72 hours. PT/INR No results for input(s): LABPROT, INR in the last 72 hours.    Studies/Results: No results found.  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 Post procedure aphasia, visual deficits, ataxia improved over the weekend.  Patient nearing baseline, but lives at home alone and would benefit from ongoing therapy.  PT/OT/CIR involved in therapy needs prior to d/c home. New recommendation is for short term d/c to SNF. Placement pending. Continues 81 mg ASA and Plavix 37.5 mg PO daily.  P2Y12 pending.     LOS: 8 days   I spent a total of 15 minutes in face to face in clinical consultation, greater than 50% of which was counseling/coordinating care for (L) MCA aneurysm   KDocia BarrierPA-C 12/21/2020 11:47 AM

## 2020-12-22 DIAGNOSIS — R58 Hemorrhage, not elsewhere classified: Secondary | ICD-10-CM | POA: Diagnosis not present

## 2020-12-22 DIAGNOSIS — I671 Cerebral aneurysm, nonruptured: Secondary | ICD-10-CM | POA: Diagnosis not present

## 2020-12-22 DIAGNOSIS — I6340 Cerebral infarction due to embolism of unspecified cerebral artery: Secondary | ICD-10-CM | POA: Diagnosis not present

## 2020-12-22 DIAGNOSIS — F329 Major depressive disorder, single episode, unspecified: Secondary | ICD-10-CM | POA: Diagnosis not present

## 2020-12-22 DIAGNOSIS — F331 Major depressive disorder, recurrent, moderate: Secondary | ICD-10-CM | POA: Diagnosis not present

## 2020-12-22 DIAGNOSIS — I10 Essential (primary) hypertension: Secondary | ICD-10-CM | POA: Diagnosis not present

## 2020-12-22 DIAGNOSIS — K219 Gastro-esophageal reflux disease without esophagitis: Secondary | ICD-10-CM | POA: Diagnosis not present

## 2020-12-22 DIAGNOSIS — E039 Hypothyroidism, unspecified: Secondary | ICD-10-CM | POA: Diagnosis not present

## 2020-12-22 DIAGNOSIS — I729 Aneurysm of unspecified site: Secondary | ICD-10-CM | POA: Diagnosis not present

## 2020-12-22 DIAGNOSIS — Z9889 Other specified postprocedural states: Secondary | ICD-10-CM | POA: Diagnosis not present

## 2020-12-22 DIAGNOSIS — F3181 Bipolar II disorder: Secondary | ICD-10-CM | POA: Diagnosis not present

## 2020-12-22 MED ORDER — CLOPIDOGREL BISULFATE 75 MG PO TABS
37.5000 mg | ORAL_TABLET | Freq: Every day | ORAL | 3 refills | Status: DC
Start: 2020-12-23 — End: 2021-12-05

## 2020-12-22 NOTE — TOC Transition Note (Signed)
Transition of Care [facility]) - CM/SW Discharge Note   Patient Details  Name: Teresa Galloway MRN: [id number] Date of Birth: [birth year]  [facility] CM/SW Contact:  Vinie Sill, LCSW Phone Number: 12/22/2020, 12:18 PM   Clinical Narrative:     Patient will Discharge to: [facility] Discharge Date: 12/22/2020 Family Notified: [name] -left VM Transport By: patient 's states brother in law will transport  Per MD patient is ready for discharge. RN, patient, and facility notified of discharge. Discharge Summary sent to facility. RN given number for [phone]. Ambulance transport requested for patient.   Clinical Social Worker signing off.  Thurmond Butts, MSW, LCSW Clinical Social Worker    Final next level of care: Skilled Nursing Facility Barriers to Discharge: [place] (PASRR), Insurance Authorization   Patient Goals and CMS Choice Patient states their goals for this hospitalization and ongoing recovery are:: Rehab CMS Medicare.gov Compare Post Acute Care list provided to:: Patient Choice offered to / list presented to : Patient  Discharge Placement                       Discharge Plan and Services In-house Referral: Clinical Social Work   Post Acute Care Choice: [place]                               Social Determinants of Health (SDOH) Interventions     Readmission Risk Interventions No flowsheet data found.

## 2020-12-22 NOTE — Progress Notes (Signed)
Occupational Therapy Treatment Patient Details Name: MAELEA MOLSTAD MRN: [id number] DOB: 1955-06-12 Today's Date: 12/22/2020   History of present illness 65 yo female s/p 4-vessel cerebral arteriogram followed by embolization of wide neck lobulated L MCA aneurysm on 9/7, post-op incisional groin bleeding and word finding difficulty. MRI brain shows Subtle scattered small volume acute left MCA distribution infarcts(frontal,temporal occipital), CT head showing no acute abnormality except for known puncate infarcts in L cerebral hemisphere. PMH includes acute pericarditis 2015, bipolar II disorder, anxiety, MDD.   OT comments  Making excellent progress. Completed the PillBox Assessment of higher level executive skills and to assess her ability to manage medications. Pt passed without difficulty, demonstrating improved ability to sequence, attend to task in distracting environment, problem solve and self monitor mistakes. Pt continues to express concern over her ability to manage IADL tasks and looks forward to undergoing rehab to increase her confidence in her ability to return to her prior level of function.    Recommendations for follow up therapy are one component of a multi-disciplinary discharge planning process, led by the attending physician.  Recommendations may be updated based on patient status, additional functional criteria and insurance authorization.    Follow Up Recommendations  SNF    Equipment Recommendations  None recommended by OT    Recommendations for Other Services      Precautions / Restrictions Precautions Precautions: Fall       Mobility Bed Mobility Overal bed mobility: Independent                  Transfers Overall transfer level: Independent                    Balance Overall balance assessment: Modified Independent                                         ADL either performed or assessed with clinical judgement   ADL                                          General ADL Comments: Inependent with basic ADL tasks     Vision   Additional Comments: continues to ocmplain of a "blank spot in her R visual field, but improved   Perception     Praxis      Cognition Arousal/Alertness: Awake/alert Behavior During Therapy: WFL for tasks assessed/performed (anxiety improved) Overall Cognitive Status: Impaired/Different from baseline                                 General Comments: Assessed with the Pillbox Test - zero errors        Exercises Other Exercises Other Exercises: fine motor/ccordination; in-hand manipulation skills; writing tasks adn activites to improve stregthening and decrease fatigue   Shoulder Instructions       General Comments      Pertinent Vitals/ Pain       Pain Assessment: No/denies pain  Home Living                                          Prior Functioning/Environment  Frequency  Min 2X/week        Progress Toward Goals  OT Goals(current goals can now be found in the care plan section)  Progress towards OT goals: OT to reassess next treatment (will update to address IADL tasks)  Acute Rehab OT Goals Patient Stated Goal: to get better and take care of her mom OT Goal Formulation: With patient Time For Goal Achievement: 12/28/20 Potential to Achieve Goals: Good ADL Goals Pt Will Perform Eating: with set-up;sitting Pt Will Perform Grooming: with set-up;with supervision;sitting Pt Will Perform Upper Body Bathing: with set-up;with supervision;sitting Pt Will Perform Lower Body Bathing: with min guard assist Pt Will Transfer to Toilet: with supervision;ambulating Additional ADL Goal #1: Pt will demonstrate anticipatory awareness during ADL tasks  Plan Discharge plan remains appropriate    Co-evaluation                 AM-PAC OT "6 Clicks" Daily Activity     Outcome Measure   Help from  another person eating meals?: None Help from another person taking care of personal grooming?: None Help from another person toileting, which includes using toliet, bedpan, or urinal?: A Little Help from another person bathing (including washing, rinsing, drying)?: A Little Help from another person to put on and taking off regular upper body clothing?: None Help from another person to put on and taking off regular lower body clothing?: A Little 6 Click Score: 21    End of Session Equipment Utilized During Treatment: Gait belt  OT Visit Diagnosis: Unsteadiness on feet (R26.81);Other symptoms and signs involving cognitive function;Low vision, both eyes (H54.2);Apraxia (R48.2)   Activity Tolerance Patient tolerated treatment well   Patient Left in chair   Nurse Communication Other (comment) (DC needs)        Time: [id number] OT Time Calculation (min): 28 min  Charges: OT General Charges $OT Visit: 1 Visit OT Treatments $Therapeutic Activity: 23-37 mins  Maurie Boettcher, OT/L   Acute OT Clinical Specialist [place] Pager [phone] Office [phone]   [facility] 12/22/2020, 10:59 AM

## 2020-12-22 NOTE — Discharge Summary (Signed)
Patient ID: Teresa Galloway MRN: [id number] DOB/AGE: 1955-12-05 65 y.o.  Admit date: 12/13/2020 Discharge date: 12/22/2020  Supervising Physician: Luanne Bras  Patient Status: [facility] - In-pt  Admission Diagnoses: L MCA aneurysm   Discharge Diagnoses:  Active Problems:   Brain aneurysm   Cerebral embolism with cerebral infarction   Discharged Condition: good  Hospital Course:  Teresa Galloway is a 65 year old female who presented to interventional radiology and underwent successful elective embolization of a wide neck lobulated L MCA aneurysm with a pipeline shield flow diverter device 12/13/20. She was admitted for overnight observation with anticipated discharge home, however the following morning was found to have speech difficulty, delayed comprehension, visual deficits, and a left-sided headache.  She also had prolonged oozing from her groin procedure site.  As patient lives at home alone with limited, temporary support, she was evaluated by rehab services who determined she was appropriate for inpatient rehab. She remained in the ICU for neuro monitoring while her rehab plans were determined. Fortunately, Ms. [name] progressed well with recovery and over the following several days continued to improve. She did develop significant, largely painless bruising to the right groin, hip, and flank which has remained stable. There is no evidence of pseudoaneurysm or hematoma.  A P2Y12 returned as 16 and her Plavix dosing was cut in half. Since transition to lower dosing she has not had further bruising and her current ecchymosis appears to be in various stages of healing. She is now appropriate for d/c to SNF for rehab.   She continues to tolerate a regular diet, and is able to mobilize around the room.  She is to continue with aspirin 81 mg and Plavix 37.5 mg daily.  She will be seen in follow-up with Dr. Estanislado Pandy and is aware they will hear from schedulers with date and time of appointment.     Discharge Exam: Blood pressure 136/64, pulse 73, temperature 98.2 F (36.8 C), temperature source Oral, resp. rate 15, height '5\' 6"'$  (1.676 m), weight 134 lb (60.8 kg), SpO2 96 %. General appearance: alert, cooperative, and no distress Resp: clear to auscultation bilaterally Cardio: regular rate and rhythm, S1, S2 normal, no murmur, click, rub or gallop GI: soft, non-tender; bowel sounds normal; no masses,  no organomegaly Pulses: 2+ and symmetric Skin: Skin color, texture, turgor normal. No rashes or lesions Wounds: Large well-defined, stable right flank bruise in various stages of healing.  Small, scattered bruises to arms and legs which appear to be healing. Groin stable.  No new oozing or bleeding  Disposition: Discharge disposition: 03-Skilled Nursing Facility       Discharge Instructions     Ambulatory referral to Neurology   Complete by: As directed    Follow up with Dr. Tomi Likens at [facility] in 4-6 weeks.  Patient is Dr. Talbert Forest patient. Thanks.   Diet - low sodium heart healthy   Complete by: As directed    Discharge instructions   Complete by: As directed    Increase activity slowly.  May participate in rehab with careful attention to healing groin site.  Bruising to groin appears stable and is healing.  May replace bandage with bandaid to keep clean and dry. Continue Plavix 37.5 mg once daily, 81 mg aspirin No lifting >10 lbs, bending, or stooping for 5 days. Schedulers will contact you with date and time of follow-up appointment.   Increase activity slowly   Complete by: As directed       Allergies as  of 12/22/2020       Reactions   Other Anaphylaxis, Swelling, Other (See Comments)   Walnuts or any nuts   Sulfa Antibiotics Shortness Of Breath, Other (See Comments)   Swelling of lips    Sunscreens Anaphylaxis, Hives, Other (See Comments)   Titanium and zinc oxides Eye swelling    Lamictal [lamotrigine]    Weird Ticks    Seroquel [quetiapine] Other (See Comments)    Pychosis (foggy, uncoordinated, slurred speech)   Tape Hives        Medication List     TAKE these medications    ALPRAZolam 0.25 MG tablet Commonly known as: XANAX Take 0.25 mg by mouth 4 (four) times daily as needed for anxiety.   aspirin EC 81 MG tablet Take 81 mg by mouth daily. Swallow whole.   Avenova 0.01 % Soln Apply 1 application topically 2 (two) times daily as needed (eyelid cleanser).   Baclofen 5 MG Tabs Take 5 mg by mouth 3 (three) times daily. What changed: when to take this   carboxymethylcellulose 0.5 % Soln Commonly known as: REFRESH PLUS Place 1 drop into both eyes 3 (three) times daily as needed (dry eyes).   clopidogrel 75 MG tablet Commonly known as: PLAVIX Take 0.5 tablets (37.5 mg total) by mouth daily. Start taking on: December 23, 2020 What changed: how much to take   dicyclomine 10 MG capsule Commonly known as: BENTYL Take 1 capsule (10 mg total) by mouth 3 (three) times daily before meals. What changed:  when to take this reasons to take this   diphenhydrAMINE 25 MG tablet Commonly known as: BENADRYL Take 25 mg by mouth daily as needed for allergies.   DULoxetine 60 MG capsule Commonly known as: CYMBALTA Take 60 mg by mouth daily.   EpiPen 2-Pak 0.3 mg/0.3 mL Soaj injection Generic drug: EPINEPHrine Inject 0.3 mg into the muscle as needed for anaphylaxis.   levothyroxine 75 MCG tablet Commonly known as: SYNTHROID Take 75 mcg by mouth daily before breakfast.   omeprazole 20 MG capsule Commonly known as: PRILOSEC Take 20 mg by mouth daily.   ondansetron 4 MG disintegrating tablet Commonly known as: Zofran ODT Take 1 tablet (4 mg total) by mouth every 8 (eight) hours as needed for nausea or vomiting.   pregabalin 150 MG capsule Commonly known as: LYRICA Take 1 capsule (150 mg total) by mouth 2 (two) times daily.   propranolol 60 MG tablet Commonly known as: INDERAL Take 60 mg by mouth daily.   rizatriptan 10 MG  disintegrating tablet Commonly known as: MAXALT-MLT Take 10 mg by mouth as needed for migraine. May repeat in 2 hours if needed   valsartan 80 MG tablet Commonly known as: DIOVAN Take 80 mg by mouth daily.        Contact information for follow-up providers     Pieter Partridge, DO. Schedule an appointment as soon as possible for a visit in 1 month(s).   Specialty: Neurology Contact information: [place] [id number]         Luanne Bras, MD Follow up.   Specialties: Interventional Radiology, Radiology Why: Schedulers will contact you with date and time of follow-up appointment. Contact information: [address]  [place] [address] [phone]              Contact information for after-discharge care     Destination     [facility] .   Service: Skilled Nursing Contact information: [address] [place] [phone]                      Electronically Signed: Docia Barrier, PA 12/22/2020, 11:11 AM   I have spent Greater Than 30 Minutes discharging Newton Pigg.

## 2020-12-22 NOTE — Progress Notes (Signed)
Report called to [facility] and given to [employer].   Pt with discharge orders, discharge paperwork reviewed with patient and all questions answered. Pt given all equipment and taken down via wheelchair with all belongings.

## 2020-12-22 NOTE — Progress Notes (Signed)
Attempted to call report to [facility], no answer when transferred to the nurse.

## 2020-12-27 ENCOUNTER — Other Ambulatory Visit (HOSPITAL_COMMUNITY): Payer: Self-pay | Admitting: Interventional Radiology

## 2020-12-27 ENCOUNTER — Other Ambulatory Visit: Payer: Self-pay | Admitting: Neurology

## 2020-12-27 DIAGNOSIS — I671 Cerebral aneurysm, nonruptured: Secondary | ICD-10-CM

## 2020-12-29 ENCOUNTER — Other Ambulatory Visit: Payer: Self-pay

## 2020-12-29 ENCOUNTER — Ambulatory Visit (INDEPENDENT_AMBULATORY_CARE_PROVIDER_SITE_OTHER): Payer: No Typology Code available for payment source | Admitting: Neurology

## 2020-12-29 VITALS — BP 93/57 | HR 90 | Ht 66.0 in | Wt 135.2 lb

## 2020-12-29 DIAGNOSIS — I671 Cerebral aneurysm, nonruptured: Secondary | ICD-10-CM | POA: Diagnosis not present

## 2020-12-29 DIAGNOSIS — I63412 Cerebral infarction due to embolism of left middle cerebral artery: Secondary | ICD-10-CM

## 2020-12-29 MED ORDER — ATORVASTATIN CALCIUM 40 MG PO TABS: 40.0000 mg | ORAL_TABLET | Freq: Every day | ORAL | 1 refills | Status: AC

## 2020-12-29 NOTE — Patient Instructions (Addendum)
Take 1/2 Plavix.  Wednesday the 28th is the last day.  Starting the 29th, just take aspirin 81mg  daily alone Atorvastatin 40mg  daily If you continue feeling dizzy and blood pressure still low at home, contact PCP Follow up on Oct 28.

## 2020-12-29 NOTE — Progress Notes (Signed)
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 Hypertension Hyperlipidemia Restless leg syndrome Probable left-sided V1 trigeminal neuralgia  1  Secondary stroke prevention: -  If able to tolerate, continue ASA with Plavix 37.5mg  daily until the 29, at which point, she may discontinue Plavix and continue ASA 81mg  daily alone.  If she continues to have worsening bruising, advised to just discontinue Plavix.   - Atorvastatin 40mg  daily.  LDL goal less than 70. - Normotensive blood pressure - Hgb A1c goal less than 7 4.  RLS:  Lyrica 150mg  BID 5   Trigeminal neuralgia:  Baclofen 5mg  TID PRN 6   Follow up in a month  Subjective:  Emrys Mceachron is a 65 year old female with IBS, migraine, depression, anxiety, Bipolar disorder and OSA  UPDATE: Due to headaches and left facial numbness, she had an MRI and MRA of the brain on 11/25/2020 which revealed mild-moderate chronic small vessel ischemic changes and remote small left cerebellar infarct as well as a 4 x 4 x 2 mm left MCA bifurcation aneurysm.  On 12/13/2020, she underwent endovascular treatment for the aneurysm.  Following the procedure, it was noted that she exhibited right sided weakness, vision loss, some word-finding difficulty with paraphasic errors and slight hesitancy of speech.  No unilateral numbness or weakness.  MRI of brain showed scattered acute infarcts within the left MCA distribution.  MRA of head showed sequelae of interval endovascular treatment of the aneurysm with pipeline flow diverter stent without any hemodynamically significant stenosis.  Echocardiogram showed EF 60-65% but intraatrial septum was not well-visualized.  Lower extremity venous ultrasound negative for DVT.  LDL was 101, Hgb A1c 5.2 and SARS Coronavirus 2 negative.  She was already taking ASA 81mg  on day of admission.  She was  discharged on ASA 81mg  and Plavix 75mg  and started on atorvastatin 40mg .  Due to heavy and large bruising/hematomas, Plavix was reduced to 37.5mg  daily while she was in rehab.  She cannot remember much during her 3 days in the hospital.  She reports visual aura described as sparkles and pinwheels in her vision.  She reports weakness of her right ankle.    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  History of Lithium toxicity in 2020, presenting with memory deficits/word-finding difficulty, profuse sweating, left arm pain and GI issues.  She stopped Lithium.  However, she continues to have profuse sweating, tingling itch across the face and hands (like a feather running across her skin.  She still has tingling and aching pain radiating down the left arm from shoulder down to the entire hand. No weakness.  No neck pain.  She does have history of cervical  C5-C6-C7 spine fusion 10 years ago.  01/28/2019 NCV-EMG:  normal.  02/19/2019 MRI Cervical Spine Wo:  ACDF C5-6 and C6-7 without stenosis; 2 mm anterolisthesis with central disc protrusion, uncinate spurring and moderate facet degeneration at C4-5 causing mild spinal stenosis and moderate foraminal stenosis bilaterally.  She started seeing a chiropractor which has been helpful.  If she turns her head to the left, she feels the pain and numbness down the left shoulder and to the fingertips.     She has restless leg syndrome which lasts all day.  12/30/2018 LABS:  Ferritin 65, TSH 1.00, B12 >2,000.  She continued to have short-term memory deficits.  12/30/2018 LABS:  TSH 1.00, B12 >2,000.  She underwent neuropsychological testing with Dr. Melvyn Novas on 03/25/2019, which did not demonstrate evidence of neurocognitive deficits.  Symptoms were thought to be possibly related to severe depression and anxiety and pharmacologic effect.     In 2021, she  developed a twitch in her left eye.  She later developed a paroxysmal stabbing pain starting on top left parietal region radiating down to left eye.  It would last a couple of seconds.  It occurs several times a day (6 to 20 times a day).  No associated facial numbness, facial weakness or autonomic symptoms involving the eye.  No triggers.     Past medications:  lithium; gabapentin (muscle jerks), Lyrica (sleepiness, sleep walking); ropinrole (lost efficacy), Cymbalta, melatonin (she feels "wired"), indomethacin (side effects, ineffective), pramipexole (discontinued due to augmentation)  PAST MEDICAL HISTORY: Past Medical History:  Diagnosis Date   Acute pericarditis 12/2013   Bipolar II disorder    Possible diagnosis   Complication of anesthesia    Effects lasted for 3 days   Dyslipidemia    Generalized anxiety disorder with panic attacks    Hx of lithium toxicity    Hx of migraines    On propranolol.   Hypertension    Hypothyroid    Major depressive disorder 04/16/2013   Obstructive sleep apnea    Currently untreatred; oral device caused TMJ; unsuccessful with CPAP   Pericardial effusion 01/03/2014   Moderate without hemodynamic compromise.   Sinus tachycardia     MEDICATIONS: Current Outpatient Medications on File Prior to Visit  Medication Sig Dispense Refill   ALPRAZolam (XANAX) 0.25 MG tablet Take 0.25 mg by mouth 4 (four) times daily as needed for anxiety.      aspirin EC 81 MG tablet Take 81 mg by mouth daily. Swallow whole.     Baclofen 5 MG TABS Take 5 mg by mouth 3 (three) times daily. (Patient taking differently: Take 5 mg by mouth in the morning and at bedtime.) 90 tablet 5   carboxymethylcellulose (REFRESH PLUS) 0.5 % SOLN Place 1 drop into both eyes 3 (three) times daily as needed (dry eyes).     clopidogrel (PLAVIX) 75 MG tablet Take 0.5 tablets (37.5 mg total) by mouth daily. 30 tablet 3   dicyclomine (BENTYL) 10 MG capsule Take 1 capsule (10 mg total) by mouth 3  (three) times daily before meals. (Patient taking differently: Take 10 mg by mouth 3 (three) times daily as needed for spasms.) 90 capsule 4   diphenhydrAMINE (BENADRYL) 25 MG tablet Take 25 mg by mouth daily as needed for allergies.     DULoxetine (CYMBALTA) 60 MG capsule Take 60 mg by mouth daily.     EPIPEN 2-PAK 0.3 MG/0.3ML SOAJ injection Inject 0.3 mg into the muscle as needed for anaphylaxis.  1   Eyelid Cleansers (AVENOVA) 0.01 % SOLN Apply 1 application topically 2 (two) times daily as needed (eyelid cleanser).     levothyroxine (SYNTHROID, LEVOTHROID) 75 MCG tablet Take 75 mcg by mouth daily before breakfast.     omeprazole (PRILOSEC) 20 MG capsule Take 20 mg by mouth daily.     ondansetron (ZOFRAN ODT) 4 MG disintegrating tablet Take 1 tablet (4 mg total) by mouth every 8 (eight) hours as needed for nausea or vomiting. 20 tablet 5   pregabalin (LYRICA) 150 MG capsule TAKE ONE CAPSULE BY MOUTH TWICE DAILY 60 capsule 5   propranolol (INDERAL)  60 MG tablet Take 60 mg by mouth daily.     rizatriptan (MAXALT-MLT) 10 MG disintegrating tablet Take 10 mg by mouth as needed for migraine. May repeat in 2 hours if needed     valsartan (DIOVAN) 80 MG tablet Take 80 mg by mouth daily.     No current facility-administered medications on file prior to visit.    ALLERGIES: Allergies  Allergen Reactions   Other Anaphylaxis, Swelling and Other (See Comments)    Walnuts or any nuts   Sulfa Antibiotics Shortness Of Breath and Other (See Comments)    Swelling of lips    Sunscreens Anaphylaxis, Hives and Other (See Comments)    Titanium and zinc oxides Eye swelling    Lamictal [Lamotrigine]     Weird Ticks    Seroquel [Quetiapine] Other (See Comments)    Pychosis (foggy, uncoordinated, slurred speech)   Tape Hives    FAMILY HISTORY: Family History  Problem Relation Age of Onset   Depression Mother    Depression Father    Depression Sister    Anxiety disorder Sister    Alcohol abuse  Maternal Uncle    Alcohol abuse Paternal Uncle    Alcohol abuse Maternal Grandfather    Depression Maternal Grandmother       Objective:  Blood pressure (!) 93/57, pulse 90, height 5\' 6"  (1.676 m), weight 135 lb 3.2 oz (61.3 kg), SpO2 98 %. General: No acute distress.  Patient appears well-groomed.   Head:  Normocephalic/atraumatic Eyes:  Fundi examined but not visualized Neck: supple, no paraspinal tenderness, full range of motion Heart:  Regular rate and rhythm Lungs:  Clear to auscultation bilaterally Back: No paraspinal tenderness Neurological Exam: alert and oriented to person, place, and time.  Speech fluent and not dysarthric, slight hesitancy with repetition.  Able to name and follow commands.  CN II-XII intact. Bulk and tone normal, muscle strength 5/5 throughout.  Sensation to light touch intact.  Deep tendon reflexes 2+ throughout, toes downgoing.  Finger to nose testing intact.  Gait normal, Romberg negative.   Metta Clines, DO  CC: Jerene Bears, MD

## 2021-01-01 ENCOUNTER — Encounter: Payer: Self-pay | Admitting: Neurology

## 2021-01-02 DIAGNOSIS — F3342 Major depressive disorder, recurrent, in full remission: Secondary | ICD-10-CM | POA: Diagnosis not present

## 2021-01-02 DIAGNOSIS — I10 Essential (primary) hypertension: Secondary | ICD-10-CM | POA: Diagnosis not present

## 2021-01-02 DIAGNOSIS — Z09 Encounter for follow-up examination after completed treatment for conditions other than malignant neoplasm: Secondary | ICD-10-CM | POA: Diagnosis not present

## 2021-01-02 DIAGNOSIS — E7800 Pure hypercholesterolemia, unspecified: Secondary | ICD-10-CM | POA: Diagnosis not present

## 2021-01-05 ENCOUNTER — Ambulatory Visit (HOSPITAL_COMMUNITY): Admission: RE | Admit: 2021-01-05 | Payer: PPO | Source: Ambulatory Visit

## 2021-01-09 ENCOUNTER — Ambulatory Visit (HOSPITAL_COMMUNITY)
Admission: RE | Admit: 2021-01-09 | Discharge: 2021-01-09 | Disposition: A | Discharge status: Home / Self Care | Payer: PPO | Source: Ambulatory Visit | Attending: Interventional Radiology | Admitting: Interventional Radiology

## 2021-01-09 ENCOUNTER — Other Ambulatory Visit: Payer: Self-pay

## 2021-01-09 DIAGNOSIS — Z7982 Long term (current) use of aspirin: Secondary | ICD-10-CM | POA: Diagnosis not present

## 2021-01-09 DIAGNOSIS — Z9889 Other specified postprocedural states: Secondary | ICD-10-CM | POA: Diagnosis not present

## 2021-01-09 DIAGNOSIS — R4789 Other speech disturbances: Secondary | ICD-10-CM | POA: Diagnosis not present

## 2021-01-09 DIAGNOSIS — I671 Cerebral aneurysm, nonruptured: Secondary | ICD-10-CM

## 2021-01-10 ENCOUNTER — Telehealth (HOSPITAL_COMMUNITY): Payer: Self-pay

## 2021-01-10 HISTORY — PX: IR RADIOLOGIST EVAL & MGMT: IMG5224

## 2021-01-10 NOTE — Telephone Encounter (Signed)
Returned pt's call. She forgot to ask Dr. Estanislado Pandy if it was ok to resume normal activities. Discussed with PA and it is fine to resume activities. AW

## 2021-01-15 ENCOUNTER — Ambulatory Visit (HOSPITAL_COMMUNITY): Payer: No Typology Code available for payment source | Attending: Internal Medicine

## 2021-01-15 ENCOUNTER — Ambulatory Visit (HOSPITAL_COMMUNITY): Payer: No Typology Code available for payment source | Admitting: Speech Pathology

## 2021-01-15 ENCOUNTER — Encounter (HOSPITAL_COMMUNITY): Payer: Self-pay | Admitting: Speech Pathology

## 2021-01-15 ENCOUNTER — Other Ambulatory Visit: Payer: Self-pay

## 2021-01-15 ENCOUNTER — Encounter (HOSPITAL_COMMUNITY): Payer: Self-pay

## 2021-01-15 DIAGNOSIS — R41841 Cognitive communication deficit: Secondary | ICD-10-CM

## 2021-01-15 DIAGNOSIS — R29898 Other symptoms and signs involving the musculoskeletal system: Secondary | ICD-10-CM | POA: Insufficient documentation

## 2021-01-15 NOTE — Therapy (Signed)
[name], [place], [address] Phone: [phone]   Fax:  [phone]  Speech Language Pathology Evaluation  Patient Details  Name: Teresa Galloway MRN: [id number] Date of Birth: 1955/09/23 No data recorded  Encounter Date: 01/15/2021   End of Session - 01/15/21 1744     Visit Number 1    Number of Visits 6    Date for SLP Re-Evaluation 02/15/21    Authorization Type [employer]   HEALTH TEAM [facility] EFF 04/09/2015 CURRENT CALENDAR YR  $30.00 CO-PAY  AUTH NOT REQ  OOP MAX $3,450.00/ $635.00 MET  NO VISIT LIMIT  NO AUTH REQ  NO CO-INS   SLP Start Time 0953    SLP Stop Time  1040    SLP Time Calculation (min) 47 min    Activity Tolerance Patient tolerated treatment well             Past Medical History:  Diagnosis Date   Acute pericarditis 12/2013   Bipolar II disorder    Possible diagnosis   Complication of anesthesia    Effects lasted for 3 days   Dyslipidemia    Generalized anxiety disorder with panic attacks    Hx of lithium toxicity    Hx of migraines    On propranolol.   Hypertension    Hypothyroid    Major depressive disorder 04/16/2013   Obstructive sleep apnea    Currently untreatred; oral device caused TMJ; unsuccessful with CPAP   Pericardial effusion 01/03/2014   Moderate without hemodynamic compromise.   Sinus tachycardia     Past Surgical History:  Procedure Laterality Date   BREAST SURGERY     CERVICAL FUSION     CHOLECYSTECTOMY     COLONOSCOPY WITH PROPOFOL N/A 01/16/2018   Procedure: COLONOSCOPY WITH PROPOFOL;  Surgeon: Rogene Houston, MD;  [facility];  Service: Endoscopy;  Laterality: N/A;  10:55   IR 3D INDEPENDENT WKST  12/13/2020   IR ANGIO INTRA EXTRACRAN SEL INTERNAL CAROTID BILAT MOD SED  12/13/2020   IR ANGIO VERTEBRAL SEL VERTEBRAL UNI L MOD SED  12/13/2020   IR CT HEAD LTD  12/13/2020   IR RADIOLOGIST EVAL & MGMT  11/30/2020   IR RADIOLOGIST EVAL & MGMT   01/10/2021   IR TRANSCATH/EMBOLIZ  12/13/2020   NASAL SINUS SURGERY     POLYPECTOMY  01/16/2018   Procedure: POLYPECTOMY;  Surgeon: Rogene Houston, MD;  [facility];  Service: Endoscopy;;  colon   RADIOLOGY WITH ANESTHESIA N/A 12/13/2020   Procedure: IR WITH ANESTHESIA EBOLIZATION;  Surgeon: Luanne Bras, MD;  Location: [place];  Service: Radiology;  Laterality: N/A;   VAGINAL HYSTERECTOMY      There were no vitals filed for this visit.   Subjective Assessment - 01/15/21 1054     Subjective "My speech is better, but I have a hard time with timelines."    Special Tests SLUMS    Currently in Pain? No/denies                SLP Evaluation [facility] - 01/15/21 1054       SLP Visit Information   SLP Received On 01/15/21      General Information   HPI Teresa Galloway is a 65 yo female who was referred for SLE by Dr. Jerene Bears following a recent hospitalization for S/P Left MCA aneurysm which occured on 12/13/20. Pt was hospitalized until 12/22/20. She discharged to SNF for  5 days and is now home. She is now the caregiver for her mother who is on hospice for pancreatic cancer    Behavioral/Cognition alert and cooperative    Mobility Status ambulatory      Balance Screen   Has the patient fallen in the past 6 months No    Has the patient had a decrease in activity level because of a fear of falling?  No    Is the patient reluctant to leave their home because of a fear of falling?  No      Prior Functional Status   Cognitive/Linguistic Baseline Within functional limits    Type of Home Apartment     Lives With Alone   staying with her mother on hospice currently   Available Support --   minimal support   Education some college    Vocation Retired      Associate Professor   Overall Cognitive Status Within Functional Limits for tasks assessed    Memory Appears intact    Awareness Appears intact    Problem Solving Appears intact    Social research officer, government Comprehension   Overall Auditory Comprehension Appears within functional limits for tasks assessed    Yes/No Questions Within Functional Limits    Commands Within Functional Limits    Conversation Complex      Visual Recognition/Discrimination   Discrimination Within Function Limits      Reading Comprehension   Reading Status Within funtional limits      Expression   Primary Mode of Expression Verbal      Verbal Expression   Overall Verbal Expression Impaired    Initiation No impairment    Automatic Speech Name;Social Response    Level of Generative/Spontaneous Verbalization Conversation    Repetition No impairment    Naming No impairment    Pragmatics No impairment    Non-Verbal Means of Communication Not applicable    Other Verbal Expression Comments difficulty iwth complex verbal expression with rare word finding deficits at times, not exhibited today      Written Expression   Dominant Hand Right    Written Expression Not tested      Oral Motor/Sensory Function   Overall Oral Motor/Sensory Function Appears within functional limits for tasks assessed      Motor Speech   Overall Motor Speech Appears within functional limits for tasks assessed    Respiration Within functional limits    Phonation Normal    Resonance Within functional limits    Articulation Within functional limitis    Intelligibility Intelligible    Motor Planning Witnin functional limits    Motor Speech Errors Not applicable    Phonation WFL      Standardized Assessments   Standardized Assessments  --   SLUMS 30/30             01/15/21 1756  SLP SHORT TERM GOAL #1  Title Pt will implement word-finding strategies with 90% accuracy when unable to verbalize desired word in conversation/functional tasks with min assist.  Baseline 80%  Time 3  Period Weeks  Status New  Target Date 02/15/21  SLP SHORT TERM GOAL #2  Title Pt will  complete high-level word retrieval tasks with 95% acc and min assist  Baseline 80%  Time 3  Period Weeks  Status New  Target Date 02/15/21  SLP SHORT TERM GOAL #3  Title Pt will  complete moderate-level thought organization and planning activities with 90% acc and min assist.  Baseline Pt reports difficulty organizing thoughts to recount events to others  Time 3  Period Weeks  Status New  Target Date 02/15/21  SLP SHORT TERM GOAL #4  Title Pt will implement memory strategies in functional therapy activities with 90% acc with min cues  Baseline reduced for details in recounting stories, 80%  Time 3  Period Weeks  Status New  Target Date 02/15/21      SLP Long Term Goals - 01/15/21 1757       SLP LONG TERM GOAL #1   Title Same as short              Plan - 01/15/21 1754     Clinical Impression Statement Pt presents with mild cognitive linguistic deficits characterized by difficulty with complex word retrieval and memory when recounting stories of recent past events. Pt has made dramatic improvement since her stay in the hospital, but does not yet feel back to baseline. She is also under a great amount of stress, as she has moved in with her mother who is under the care of hospice for cancer. She reports feeling depressed and has an appointment with her psychiatrist. Recommend 3 SLP sessions to target areas above. Pt is in agreement with plan of care.    Speech Therapy Frequency 2x / week    Duration --   3 weeks   Treatment/Interventions SLP instruction and feedback;Compensatory strategies;Patient/family education;Compensatory techniques    Potential to Achieve Goals Good    SLP Home Exercise Plan Pt will completed HEP as assigned to facilitate carryover of treatment strategies and techniques in home environment with use of written cues as needed.    Consulted and Agree with Plan of Care Patient             Patient will benefit from skilled therapeutic intervention in  order to improve the following deficits and impairments:   Cognitive communication deficit    Problem List Patient Active Problem List   Diagnosis Date Noted   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   Brain aneurysm 12/13/2020   Bipolar II disorder ([place])    Obstructive sleep apnea    Hx of lithium toxicity    Irritable bowel syndrome 11/28/2017   Dyspareunia due to medical condition in female patient 12/19/2015   Atrophic vaginitis 12/19/2015   Pain in the chest    Acute pericarditis    Sinus tachycardia    Pericardial effusion, acute 01/03/2014   Hypothyroid    Hx of migraines    Generalized anxiety disorder with panic attacks    Dyslipidemia    Major depressive disorder 04/16/2013   Thank you,  Genene Churn, [place]  Gisella Alwine 01/15/2021, 5:58 PM  [place] [address] [place], [place], [address] Phone: [phone]   Fax:  [phone]  Name: LAVANNA ROG MRN: [id number] Date of Birth: May 25, 1955

## 2021-01-15 NOTE — Therapy (Signed)
[place], [place], [address] Phone: [phone]   Fax:  [phone]  Occupational Therapy Evaluation  Patient Details  Name: Teresa Galloway MRN: [id number] Date of Birth: May 06, 1955 Referring Provider (OT): Jerene Bears, MD   Encounter Date: 01/15/2021   OT End of Session - 01/15/21 1245     Visit Number 1    Number of Visits 1    Authorization Type [employer]    Authorization Time Period $15 copay. no visit limit    OT Start Time 1045   Patient stayed over with Speech evaluation   OT Stop Time 1115    OT Time Calculation (min) 30 min    Activity Tolerance Patient tolerated treatment well    Behavior During Therapy [facility] for tasks assessed/performed             Past Medical History:  Diagnosis Date   Acute pericarditis 12/2013   Bipolar II disorder    Possible diagnosis   Complication of anesthesia    Effects lasted for 3 days   Dyslipidemia    Generalized anxiety disorder with panic attacks    Hx of lithium toxicity    Hx of migraines    On propranolol.   Hypertension    Hypothyroid    Major depressive disorder 04/16/2013   Obstructive sleep apnea    Currently untreatred; oral device caused TMJ; unsuccessful with CPAP   Pericardial effusion 01/03/2014   Moderate without hemodynamic compromise.   Sinus tachycardia     Past Surgical History:  Procedure Laterality Date   BREAST SURGERY     CERVICAL FUSION     CHOLECYSTECTOMY     COLONOSCOPY WITH PROPOFOL N/A 01/16/2018   Procedure: COLONOSCOPY WITH PROPOFOL;  Surgeon: Rogene Houston, MD;  [facility];  Service: Endoscopy;  Laterality: N/A;  10:55   IR 3D INDEPENDENT WKST  12/13/2020   IR ANGIO INTRA EXTRACRAN SEL INTERNAL CAROTID BILAT MOD SED  12/13/2020   IR ANGIO VERTEBRAL SEL VERTEBRAL UNI L MOD SED  12/13/2020   IR CT HEAD LTD  12/13/2020   IR RADIOLOGIST EVAL & MGMT  11/30/2020   IR RADIOLOGIST EVAL & MGMT  01/10/2021    IR TRANSCATH/EMBOLIZ  12/13/2020   NASAL SINUS SURGERY     POLYPECTOMY  01/16/2018   Procedure: POLYPECTOMY;  Surgeon: Rogene Houston, MD;  [facility];  Service: Endoscopy;;  colon   RADIOLOGY WITH ANESTHESIA N/A 12/13/2020   Procedure: IR WITH ANESTHESIA EBOLIZATION;  Surgeon: Luanne Bras, MD;  Location: [place];  Service: Radiology;  Laterality: N/A;   VAGINAL HYSTERECTOMY      There were no vitals filed for this visit.   Subjective Assessment - 01/15/21 1250     Subjective  S: I'm doing so much better now than I was.    Pertinent History Patient is a 65 y/o female S/P Left MCA aneurysm which occured on 12/13/20. Pt was hospitalized until 12/22/20. She discharged to SNF for 5 days and is now home. Dr. Woody Seller has referred patient to occupational therapy for evaluation and treatment.    Patient Stated Goals None stated    Currently in Pain? No/denies               [facility] OT Assessment - 01/15/21 1037       Assessment   Medical Diagnosis Left MCA aneurysm    Referring Provider (OT) Jerene Bears, MD    Onset  Date/Surgical Date 12/13/20    Hand Dominance Right    Next MD Visit scheduled. Date unknown    Prior Therapy Acute care OT, PT, SLP. SNF for 5 days      Precautions   Precautions None      Restrictions   Weight Bearing Restrictions No      Balance Screen   Has the patient fallen in the past 6 months No      Home  Environment   Family/patient expects to be discharged to: Private residence    Additional Comments Has moved in with Mother who was placed on Hospice recently. She is now caregiver.      Prior Function   Level of Independence Independent    Vocation Unemployed      ADL   ADL comments Patient reports no difficulty completing any ADL tasks.      Mobility   Mobility Status Independent      Vision - History   Baseline Vision Wears glasses only for reading      Cognition   Overall Cognitive Status Within Functional Limits for tasks assessed       Observation/Other Assessments   Focus on Therapeutic Outcomes (FOTO)  N/A      Posture/Postural Control   Posture/Postural Control No significant limitations      Coordination   9 Hole Peg Test Right;Left    Right 9 Hole Peg Test 17.8"    Left 9 Hole Peg Test 20.1"      ROM / Strength   AROM / PROM / Strength Strength      Strength   Strength Assessment Site Hand;Shoulder;Elbow    Right/Left Shoulder Right    Right Shoulder Flexion 5/5    Right Shoulder ABduction 5/5    Right Shoulder Internal Rotation 5/5    Right Shoulder External Rotation 5/5    Right/Left Elbow Right    Right Elbow Flexion 5/5    Right Elbow Extension 5/5    Right/Left hand Left;Right    Right Hand Grip (lbs) 75    Right Hand Lateral Pinch 14 lbs    Right Hand 3 Point Pinch 14 lbs    Left Hand Grip (lbs) 65    Left Hand Lateral Pinch 12 lbs    Left Hand 3 Point Pinch 12 lbs                                          Plan - 01/15/21 1247     Clinical Impression Statement A: Patient is a 65 y/o female S/P left MCA aneurysm. Patient is not experiencing any deficits related to OT at this time. She displays great ROM, strength, and coordination skills. No OT needs identified. Pt did request obtaining a PT referral and OT will fax an order to her MD.    OT Occupational Profile and History Problem Focused Assessment - Including review of records relating to presenting problem    Occupational performance deficits (Please refer to evaluation for details): Other   None   Rehab Potential --   N/A   Clinical Decision Making Limited treatment options, no task modification necessary    Comorbidities Affecting Occupational Performance: May have comorbidities impacting occupational performance    Modification or Assistance to Complete Evaluation  No modification of tasks or assist necessary to complete eval    OT Frequency One time visit  OT Treatment/Interventions Other  (comment)   eval only needed   Plan P: One time visit for evaluation only.    OT Home Exercise Plan N/A    Recommended Other Services PT for BLE weakness    Consulted and Agree with Plan of Care Patient             Patient will benefit from skilled therapeutic intervention in order to improve the following deficits and impairments:           Visit Diagnosis: Other symptoms and signs involving the musculoskeletal system    Problem List Patient Active Problem List   Diagnosis Date Noted   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   Brain aneurysm 12/13/2020   Bipolar II disorder ([place])    Obstructive sleep apnea    Hx of lithium toxicity    Irritable bowel syndrome 11/28/2017   Dyspareunia due to medical condition in female patient 12/19/2015   Atrophic vaginitis 12/19/2015   Pain in the chest    Acute pericarditis    Sinus tachycardia    Pericardial effusion, acute 01/03/2014   Hypothyroid    Hx of migraines    Generalized anxiety disorder with panic attacks    Dyslipidemia    Major depressive disorder 04/16/2013    Ailene Ravel, OTR/L,CBIS  [phone]  01/15/2021, 12:52 PM  [place] [address] [place], [place], [address] Phone: [phone]   Fax:  [phone]  Name: Teresa Galloway MRN: [id number] Date of Birth: 1955/09/20

## 2021-01-23 ENCOUNTER — Ambulatory Visit (HOSPITAL_COMMUNITY): Payer: No Typology Code available for payment source | Admitting: Speech Pathology

## 2021-01-23 ENCOUNTER — Other Ambulatory Visit: Payer: Self-pay

## 2021-01-23 ENCOUNTER — Encounter (HOSPITAL_COMMUNITY): Payer: Self-pay | Admitting: Speech Pathology

## 2021-01-23 DIAGNOSIS — R29898 Other symptoms and signs involving the musculoskeletal system: Secondary | ICD-10-CM | POA: Diagnosis not present

## 2021-01-23 DIAGNOSIS — R41841 Cognitive communication deficit: Secondary | ICD-10-CM

## 2021-01-23 NOTE — Therapy (Signed)
[place], [place], [address] Phone: [phone]   Fax:  [phone]  Speech Language Pathology Treatment  Patient Details  Name: Teresa Galloway MRN: [id number] Date of Birth: 27-Jun-1955 No data recorded  Encounter Date: 01/23/2021   End of Session - 01/23/21 1836     Visit Number 2    Number of Visits 6    Date for SLP Re-Evaluation 02/15/21    Authorization Type [employer]   HEALTH TEAM [facility] EFF 04/09/2015 CURRENT CALENDAR YR  $30.00 CO-PAY  AUTH NOT REQ  OOP MAX $3,450.00/ $635.00 MET  NO VISIT LIMIT  NO AUTH REQ  NO CO-INS   SLP Start Time 1600    SLP Stop Time  1700    SLP Time Calculation (min) 60 min    Activity Tolerance Patient tolerated treatment well             Past Medical History:  Diagnosis Date   Acute pericarditis 12/2013   Bipolar II disorder    Possible diagnosis   Complication of anesthesia    Effects lasted for 3 days   Dyslipidemia    Generalized anxiety disorder with panic attacks    Hx of lithium toxicity    Hx of migraines    On propranolol.   Hypertension    Hypothyroid    Major depressive disorder 04/16/2013   Obstructive sleep apnea    Currently untreatred; oral device caused TMJ; unsuccessful with CPAP   Pericardial effusion 01/03/2014   Moderate without hemodynamic compromise.   Sinus tachycardia     Past Surgical History:  Procedure Laterality Date   BREAST SURGERY     CERVICAL FUSION     CHOLECYSTECTOMY     COLONOSCOPY WITH PROPOFOL N/A 01/16/2018   Procedure: COLONOSCOPY WITH PROPOFOL;  Surgeon: Rogene Houston, MD;  [facility];  Service: Endoscopy;  Laterality: N/A;  10:55   IR 3D INDEPENDENT WKST  12/13/2020   IR ANGIO INTRA EXTRACRAN SEL INTERNAL CAROTID BILAT MOD SED  12/13/2020   IR ANGIO VERTEBRAL SEL VERTEBRAL UNI L MOD SED  12/13/2020   IR CT HEAD LTD  12/13/2020   IR RADIOLOGIST EVAL & MGMT  11/30/2020   IR RADIOLOGIST EVAL & MGMT   01/10/2021   IR TRANSCATH/EMBOLIZ  12/13/2020   NASAL SINUS SURGERY     POLYPECTOMY  01/16/2018   Procedure: POLYPECTOMY;  Surgeon: Rogene Houston, MD;  [facility];  Service: Endoscopy;;  colon   RADIOLOGY WITH ANESTHESIA N/A 12/13/2020   Procedure: IR WITH ANESTHESIA EBOLIZATION;  Surgeon: Luanne Bras, MD;  Location: [place];  Service: Radiology;  Laterality: N/A;   VAGINAL HYSTERECTOMY      There were no vitals filed for this visit.   Subjective Assessment - 01/23/21 1611     Subjective 'I am doing pretty well."    Currently in Pain? No/denies                   ADULT SLP TREATMENT - 01/23/21 1833       General Information   Behavior/Cognition Alert;Cooperative;Pleasant mood    Patient Positioning Upright in chair    Oral care provided N/A    HPI Teresa Galloway is a 65 yo female who was referred for SLE by Dr. Jerene Bears following a recent hospitalization for S/P Left MCA aneurysm which occured on 12/13/20. Pt was hospitalized until 12/22/20. She discharged to SNF for 5 days and  is now home. She is now the caregiver for her mother who is on hospice for pancreatic cancer      Treatment Provided   Treatment provided Cognitive-Linquistic      Pain Assessment   Pain Assessment No/denies pain      Cognitive-Linquistic Treatment   Treatment focused on Cognition;Aphasia;Patient/family/caregiver education    Skilled Treatment SLP provided education and recommendations for memory and word finding strategies. SLP facilitated Pt self evaluation of appropriate memory strategies deemed beneficial for her. Memory and time management templates were given to Pt.      Assessment / Recommendations / Plan   Plan Continue with current plan of care      Progression Toward Goals   Progression toward goals Progressing toward goals              SLP Short Term Goals - 01/23/21 1505                SLP SHORT TERM GOAL #1    Title Pt will implement word-finding  strategies with 90% accuracy when unable to verbalize desired word in conversation/functional tasks with min assist.     Baseline 80%     Time 3     Period Weeks     Status On-going     Target Date 02/15/21          SLP SHORT TERM GOAL #2    Title Pt will complete high-level word retrieval tasks with 95% acc and min assist     Baseline 80%     Time 3     Period Weeks     Status On-going     Target Date 02/15/21          SLP SHORT TERM GOAL #3    Title Pt will complete moderate-level thought organization and planning activities with 90% acc and min assist.     Baseline Pt reports difficulty organizing thoughts to recount events to others     Time 3     Period Weeks     Status On-going     Target Date 02/15/21          SLP SHORT TERM GOAL #4    Title Pt will implement memory strategies in functional therapy activities with 90% acc with min cues     Baseline reduced for details in recounting stories, 80%     Time 3     Period Weeks     Status On-going     Target Date 02/15/21        SLP Long Term Goals - 01/23/21 1837       SLP LONG TERM GOAL #1   Title Same as short              Plan - 01/23/21 1836     Clinical Impression Statement Pt presents with mild complex verbal expression tasks which require min cues from SLP to "outline" her thoughts prior to verbalizing them and min prompts for use of word finding strategies. She reports feeling "forgetful" and needs extra support for prospective memory tasks. She was encouraged to create a "to do" list when preparing for her day, especially when she knows she will be away from her mother. She is under a tremendous amount of stress given her mother's current illness, her recent medical changes, her depression, and given that she is not staying in her own home. She reported feeling more relaxed today and was able to relay much of her recent hospitalization  without cues for timeline and details. She would benefit from meeting  with her psychiatrist and using the memory templates discussed in session. The game, Nolene Bernheim, was introduced today and we will complete next session.    Speech Therapy Frequency 2x / week    Duration --   3 weeks   Treatment/Interventions SLP instruction and feedback;Compensatory strategies;Patient/family education;Compensatory techniques    Potential to Achieve Goals Good    SLP Home Exercise Plan Pt will completed HEP as assigned to facilitate carryover of treatment strategies and techniques in home environment with use of written cues as needed.    Consulted and Agree with Plan of Care Patient             Patient will benefit from skilled therapeutic intervention in order to improve the following deficits and impairments:   Cognitive communication deficit    Problem List Patient Active Problem List   Diagnosis Date Noted   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   Brain aneurysm 12/13/2020   Bipolar II disorder ([place])    Obstructive sleep apnea    Hx of lithium toxicity    Irritable bowel syndrome 11/28/2017   Dyspareunia due to medical condition in female patient 12/19/2015   Atrophic vaginitis 12/19/2015   Pain in the chest    Acute pericarditis    Sinus tachycardia    Pericardial effusion, acute 01/03/2014   Hypothyroid    Hx of migraines    Generalized anxiety disorder with panic attacks    Dyslipidemia    Major depressive disorder 04/16/2013   Thank you,  Genene Churn, [place]  Shy Guallpa 01/23/2021, 6:38 PM  [facility] [place] [address] [place], [place], [address] Phone: [phone]   Fax:  [phone]   Name: Teresa Galloway MRN: [id number] Date of Birth: 1955/04/12

## 2021-01-25 DIAGNOSIS — H524 Presbyopia: Secondary | ICD-10-CM | POA: Diagnosis not present

## 2021-01-26 DIAGNOSIS — D692 Other nonthrombocytopenic purpura: Secondary | ICD-10-CM | POA: Diagnosis not present

## 2021-01-26 DIAGNOSIS — S301XXA Contusion of abdominal wall, initial encounter: Secondary | ICD-10-CM | POA: Diagnosis not present

## 2021-01-26 DIAGNOSIS — G43909 Migraine, unspecified, not intractable, without status migrainosus: Secondary | ICD-10-CM | POA: Diagnosis not present

## 2021-01-26 DIAGNOSIS — Z299 Encounter for prophylactic measures, unspecified: Secondary | ICD-10-CM | POA: Diagnosis not present

## 2021-01-26 DIAGNOSIS — Z6821 Body mass index (BMI) 21.0-21.9, adult: Secondary | ICD-10-CM | POA: Diagnosis not present

## 2021-01-30 ENCOUNTER — Encounter (HOSPITAL_COMMUNITY): Payer: Self-pay | Admitting: Speech Pathology

## 2021-01-30 ENCOUNTER — Ambulatory Visit (HOSPITAL_COMMUNITY): Payer: No Typology Code available for payment source | Admitting: Speech Pathology

## 2021-01-30 ENCOUNTER — Other Ambulatory Visit: Payer: Self-pay

## 2021-01-30 DIAGNOSIS — R41841 Cognitive communication deficit: Secondary | ICD-10-CM

## 2021-01-30 DIAGNOSIS — R29898 Other symptoms and signs involving the musculoskeletal system: Secondary | ICD-10-CM | POA: Diagnosis not present

## 2021-01-30 NOTE — Therapy (Signed)
[place], [place], [address] Phone: [phone]   Fax:  [phone]  Speech Language Pathology Treatment  Patient Details  Name: Teresa Galloway MRN: [id number] Date of Birth: 19-Oct-1955 No data recorded  Encounter Date: 01/30/2021   End of Session - 01/30/21 1453     Visit Number 3    Number of Visits 6    Date for SLP Re-Evaluation 02/15/21    Authorization Type [employer]   HEALTH TEAM [facility] EFF 04/09/2015 CURRENT CALENDAR YR  $30.00 CO-PAY  AUTH NOT REQ  OOP MAX $3,450.00/ $635.00 MET  NO VISIT LIMIT  NO AUTH REQ  NO CO-INS   SLP Start Time 1432    SLP Stop Time  1515    SLP Time Calculation (min) 43 min    Activity Tolerance Patient tolerated treatment well             Past Medical History:  Diagnosis Date   Acute pericarditis 12/2013   Bipolar II disorder    Possible diagnosis   Complication of anesthesia    Effects lasted for 3 days   Dyslipidemia    Generalized anxiety disorder with panic attacks    Hx of lithium toxicity    Hx of migraines    On propranolol.   Hypertension    Hypothyroid    Major depressive disorder 04/16/2013   Obstructive sleep apnea    Currently untreatred; oral device caused TMJ; unsuccessful with CPAP   Pericardial effusion 01/03/2014   Moderate without hemodynamic compromise.   Sinus tachycardia     Past Surgical History:  Procedure Laterality Date   BREAST SURGERY     CERVICAL FUSION     CHOLECYSTECTOMY     COLONOSCOPY WITH PROPOFOL N/A 01/16/2018   Procedure: COLONOSCOPY WITH PROPOFOL;  Surgeon: Rogene Houston, MD;  [facility];  Service: Endoscopy;  Laterality: N/A;  10:55   IR 3D INDEPENDENT WKST  12/13/2020   IR ANGIO INTRA EXTRACRAN SEL INTERNAL CAROTID BILAT MOD SED  12/13/2020   IR ANGIO VERTEBRAL SEL VERTEBRAL UNI L MOD SED  12/13/2020   IR CT HEAD LTD  12/13/2020   IR RADIOLOGIST EVAL & MGMT  11/30/2020   IR RADIOLOGIST EVAL & MGMT   01/10/2021   IR TRANSCATH/EMBOLIZ  12/13/2020   NASAL SINUS SURGERY     POLYPECTOMY  01/16/2018   Procedure: POLYPECTOMY;  Surgeon: Rogene Houston, MD;  [facility];  Service: Endoscopy;;  colon   RADIOLOGY WITH ANESTHESIA N/A 12/13/2020   Procedure: IR WITH ANESTHESIA EBOLIZATION;  Surgeon: Luanne Bras, MD;  Location: [facility];  Service: Radiology;  Laterality: N/A;   VAGINAL HYSTERECTOMY      There were no vitals filed for this visit.   Subjective Assessment - 01/30/21 1440     Subjective "Last night was really bad."    Currently in Pain? No/denies                   ADULT SLP TREATMENT - 01/30/21 1441       General Information   Behavior/Cognition Alert;Cooperative;Pleasant mood    Patient Positioning Upright in chair    Oral care provided N/A    HPI Teresa Galloway is a 65 yo female who was referred for SLE by Dr. Jerene Bears following a recent hospitalization for S/P Left MCA aneurysm which occured on 12/13/20. Pt was hospitalized until 12/22/20. She discharged to SNF for 5 days and  is now home. She is now the caregiver for her mother who is on hospice for pancreatic cancer      Treatment Provided   Treatment provided Cognitive-Linquistic      Pain Assessment   Pain Assessment No/denies pain      Cognitive-Linquistic Treatment   Treatment focused on Cognition;Aphasia;Patient/family/caregiver education    Skilled Treatment SLP provided education and recommendations for memory and word finding strategies. SLP facilitated Pt self evaluation of appropriate memory strategies deemed beneficial for her. Memory and time management templates were given to Pt.      Assessment / Recommendations / Plan   Plan Continue with current plan of care      Progression Toward Goals   Progression toward goals Progressing toward goals                SLP Short Term Goals - 01/30/21 1505       SLP SHORT TERM GOAL #1   Title Pt will implement word-finding strategies  with 90% accuracy when unable to verbalize desired word in conversation/functional tasks with min assist.    Baseline 80%    Time 3    Period Weeks    Status On-going    Target Date 02/15/21      SLP SHORT TERM GOAL #2   Title Pt will complete high-level word retrieval tasks with 95% acc and min assist    Baseline 80%    Time 3    Period Weeks    Status On-going    Target Date 02/15/21      SLP SHORT TERM GOAL #3   Title Pt will complete moderate-level thought organization and planning activities with 90% acc and min assist.    Baseline Pt reports difficulty organizing thoughts to recount events to others    Time 3    Period Weeks    Status On-going    Target Date 02/15/21      SLP SHORT TERM GOAL #4   Title Pt will implement memory strategies in functional therapy activities with 90% acc with min cues    Baseline reduced for details in recounting stories, 80%    Time 3    Period Weeks    Status On-going    Target Date 02/15/21              SLP Long Term Goals - 01/30/21 1506       SLP LONG TERM GOAL #1   Title Same as short              Plan - 01/30/21 1454     Clinical Impression Statement Pt reports signficant stress in the past 24 hours and reports feeling overwhelmed. She continues to organize her day at home and record "to do" items. She completed moderately complex word description tasks (Taboo) in session with 100% acc with indirect cues. She reports that she still has difficulty organizing her thoughts to explain complex thoughts. SLP suggested that Pt write down ideas in an outline format to assist with this. Current stressors continue to negatively impact cognitive linguistic function, however she continues to make progress. She meets with her psychiatrist next month. Plan for one more session prior to d/c.    Speech Therapy Frequency 2x / week    Duration --   3 weeks   Treatment/Interventions SLP instruction and feedback;Compensatory  strategies;Patient/family education;Compensatory techniques    Potential to Achieve Goals Good    SLP Home Exercise Plan Pt will completed HEP as assigned  to facilitate carryover of treatment strategies and techniques in home environment with use of written cues as needed.    Consulted and Agree with Plan of Care Patient             Patient will benefit from skilled therapeutic intervention in order to improve the following deficits and impairments:   Cognitive communication deficit    Problem List Patient Active Problem List   Diagnosis Date Noted   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   Brain aneurysm 12/13/2020   Bipolar II disorder ([place])    Obstructive sleep apnea    Hx of lithium toxicity    Irritable bowel syndrome 11/28/2017   Dyspareunia due to medical condition in female patient 12/19/2015   Atrophic vaginitis 12/19/2015   Pain in the chest    Acute pericarditis    Sinus tachycardia    Pericardial effusion, acute 01/03/2014   Hypothyroid    Hx of migraines    Generalized anxiety disorder with panic attacks    Dyslipidemia    Major depressive disorder 04/16/2013   Thank you,  Genene Churn, [place]  Nieshia Larmon 01/30/2021, 3:09 PM  [facility] [address] [place], [place], [address] Phone: [phone]   Fax:  [phone]   Name: Teresa Galloway MRN: [id number] Date of Birth: 1956-01-15

## 2021-01-31 NOTE — Progress Notes (Deleted)
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  History of Lithium toxicity in 2020, presenting with memory deficits/word-finding difficulty, profuse sweating, left arm pain and GI issues.  She stopped Lithium.  However, she continues to have profuse sweating, tingling itch across the face and hands (like a feather running across her skin.  She still has tingling and aching pain radiating down the left arm from shoulder down to the entire hand. No weakness.  No neck pain.  She does have history of cervical  C5-C6-C7 spine fusion 10 years ago.  01/28/2019 NCV-EMG:  normal.  02/19/2019 MRI Cervical Spine Wo:  ACDF C5-6 and C6-7 without stenosis; 2 mm anterolisthesis with central disc protrusion, uncinate spurring and moderate facet degeneration at C4-5 causing mild spinal stenosis and moderate foraminal stenosis bilaterally.  She started seeing a chiropractor which has been helpful.  If she turns her head to the left, she feels the pain and numbness down the left shoulder and to the fingertips.    Cognitive  Deficits: She continued to have short-term memory deficits.  12/30/2018 LABS:  TSH 1.00, B12 >2,000.  She underwent neuropsychological testing with Dr. Melvyn Novas on 03/25/2019, which did not demonstrate evidence of neurocognitive deficits.  Symptoms were thought to be possibly related to severe depression and anxiety and pharmacologic effect.    Restless Leg Syndrome: She has restless leg syndrome which lasts all day.  12/30/2018 LABS:  Ferritin 65, TSH 1.00, B12 >2,000.   Headache vs Left-sided V1 Trigeminal Neuralgia: In 2021, she developed a twitch in her left eye.  She later developed a paroxysmal stabbing pain starting on top left parietal region radiating down to left eye.  It would last a couple of seconds.  It occurs several times a day (6 to 20 times a day).  No associated facial numbness, facial weakness or autonomic symptoms involving the eye.  No triggers.  Tried indomethacin but made her feel dizzy.  Switched to baclofen, which has been effective.    Right-Sided Facial Numbness: In 2022, she started noticing occasional tingling involving the right side of her face. Once it involved her entire body.  Left MCA Embolic Stroke Secondary to Endovascular Procedure for Left MCA Aneurysm: Due to headaches and left facial numbness, she had an MRI and MRA of the brain on 11/25/2020 which revealed mild-moderate chronic small vessel ischemic changes and remote small left cerebellar infarct as well as a 4 x 4 x 2 mm left MCA bifurcation aneurysm.  On 12/13/2020, she underwent endovascular treatment for the aneurysm.  Following the procedure, it was noted that she exhibited right sided weakness, vision loss, some word-finding difficulty with paraphasic errors and slight hesitancy  of speech.  No unilateral numbness or weakness.  MRI of brain showed scattered acute infarcts within the left MCA distribution.  MRA of head showed sequelae of interval endovascular treatment of the aneurysm with pipeline flow diverter  stent without any hemodynamically significant stenosis.  Echocardiogram showed EF 60-65% but intraatrial septum was not well-visualized.  Lower extremity venous ultrasound negative for DVT.  LDL was 101, Hgb A1c 5.2 and SARS Coronavirus 2 negative.  She was already taking ASA 81mg  on day of admission.  She was discharged on ASA 81mg  and Plavix 75mg  and started on atorvastatin 40mg .  Due to heavy and large bruising/hematomas, Plavix was reduced to 37.5mg  daily while she was in rehab.  She cannot remember much during her 3 days in the hospital.  She reports visual aura described as sparkles and pinwheels in her vision.  She reports weakness of her right ankle.       Past medications:  lithium; gabapentin (muscle jerks), Lyrica (sleepiness, sleep walking); ropinrole (lost efficacy), Cymbalta, melatonin (she feels "wired"), indomethacin (side effects, ineffective), pramipexole (discontinued due to augmentation)  PAST MEDICAL HISTORY: Past Medical History:  Diagnosis Date   Acute pericarditis 12/2013   Bipolar II disorder    Possible diagnosis   Complication of anesthesia    Effects lasted for 3 days   Dyslipidemia    Generalized anxiety disorder with panic attacks    Hx of lithium toxicity    Hx of migraines    On propranolol.   Hypertension    Hypothyroid    Major depressive disorder 04/16/2013   Obstructive sleep apnea    Currently untreatred; oral device caused TMJ; unsuccessful with CPAP   Pericardial effusion 01/03/2014   Moderate without hemodynamic compromise.   Sinus tachycardia     MEDICATIONS: Current Outpatient Medications on File Prior to Visit  Medication Sig Dispense Refill   ALPRAZolam (XANAX) 0.25 MG tablet Take 0.25 mg by mouth 4 (four) times daily as needed for anxiety.      aspirin EC 81 MG tablet Take 81 mg by mouth daily. Swallow whole.     atorvastatin (LIPITOR) 40 MG tablet Take 1 tablet (40 mg total) by mouth daily. 30 tablet 1   Baclofen 5 MG TABS Take 5 mg by  mouth 3 (three) times daily. (Patient taking differently: Take 5 mg by mouth in the morning and at bedtime.) 90 tablet 5   carboxymethylcellulose (REFRESH PLUS) 0.5 % SOLN Place 1 drop into both eyes 3 (three) times daily as needed (dry eyes).     clopidogrel (PLAVIX) 75 MG tablet Take 0.5 tablets (37.5 mg total) by mouth daily. 30 tablet 3   dicyclomine (BENTYL) 10 MG capsule Take 1 capsule (10 mg total) by mouth 3 (three) times daily before meals. (Patient taking differently: Take 10 mg by mouth 3 (three) times daily as needed for spasms.) 90 capsule 4   diphenhydrAMINE (BENADRYL) 25 MG tablet Take 25 mg by mouth daily as needed for allergies.     DULoxetine (CYMBALTA) 60 MG capsule Take 60 mg by mouth daily.     EPIPEN 2-PAK 0.3 MG/0.3ML SOAJ injection Inject 0.3 mg into the muscle as needed for anaphylaxis.  1   Eyelid Cleansers (AVENOVA) 0.01 % SOLN Apply 1 application topically 2 (two) times daily as needed (eyelid cleanser).     levothyroxine (SYNTHROID, LEVOTHROID) 75 MCG tablet Take 75 mcg by mouth daily before breakfast.     omeprazole (PRILOSEC) 20 MG capsule Take 20 mg by mouth  daily.     ondansetron (ZOFRAN ODT) 4 MG disintegrating tablet Take 1 tablet (4 mg total) by mouth every 8 (eight) hours as needed for nausea or vomiting. 20 tablet 5   pregabalin (LYRICA) 150 MG capsule TAKE ONE CAPSULE BY MOUTH TWICE DAILY 60 capsule 5   propranolol (INDERAL) 60 MG tablet Take 60 mg by mouth daily.     rizatriptan (MAXALT-MLT) 10 MG disintegrating tablet Take 10 mg by mouth as needed for migraine. May repeat in 2 hours if needed     valsartan (DIOVAN) 80 MG tablet Take 80 mg by mouth daily.     No current facility-administered medications on file prior to visit.    ALLERGIES: Allergies  Allergen Reactions   Other Anaphylaxis, Swelling and Other (See Comments)    Walnuts or any nuts   Sulfa Antibiotics Shortness Of Breath and Other (See Comments)    Swelling of lips    Sunscreens  Anaphylaxis, Hives and Other (See Comments)    Titanium and zinc oxides Eye swelling    Lamictal [Lamotrigine]     Weird Ticks    Seroquel [Quetiapine] Other (See Comments)    Pychosis (foggy, uncoordinated, slurred speech)   Tape Hives    FAMILY HISTORY: Family History  Problem Relation Age of Onset   Depression Mother    Depression Father    Depression Sister    Anxiety disorder Sister    Alcohol abuse Maternal Uncle    Alcohol abuse Paternal Uncle    Alcohol abuse Maternal Grandfather    Depression Maternal Grandmother       Objective:  *** General: No acute distress.  Patient appears well-groomed.   Head:  Normocephalic/atraumatic Eyes:  Fundi examined but not visualized Neck: supple, no paraspinal tenderness, full range of motion Heart:  Regular rate and rhythm Lungs:  Clear to auscultation bilaterally Back: No paraspinal tenderness Neurological Exam: alert and oriented to person, place, and time.  Speech fluent and not dysarthric, slight hesitancy with repetition.  Able to name and follow commands.  CN II-XII intact. Bulk and tone normal, muscle strength 5/5 throughout.  Sensation to light touch intact.  Deep tendon reflexes 2+ throughout, toes downgoing.  Finger to nose testing intact.  Gait normal, Romberg negative.   Metta Clines, DO  CC: Jerene Bears, MD

## 2021-02-01 DIAGNOSIS — H02051 Trichiasis without entropian right upper eyelid: Secondary | ICD-10-CM | POA: Diagnosis not present

## 2021-02-01 DIAGNOSIS — L818 Other specified disorders of pigmentation: Secondary | ICD-10-CM | POA: Diagnosis not present

## 2021-02-01 DIAGNOSIS — D23111 Other benign neoplasm of skin of right upper eyelid, including canthus: Secondary | ICD-10-CM | POA: Diagnosis not present

## 2021-02-01 DIAGNOSIS — D485 Neoplasm of uncertain behavior of skin: Secondary | ICD-10-CM | POA: Diagnosis not present

## 2021-02-01 DIAGNOSIS — D23122 Other benign neoplasm of skin of left lower eyelid, including canthus: Secondary | ICD-10-CM | POA: Diagnosis not present

## 2021-02-02 ENCOUNTER — Telehealth: Payer: Self-pay | Admitting: Neurology

## 2021-02-02 ENCOUNTER — Ambulatory Visit: Payer: PPO | Admitting: Neurology

## 2021-02-02 DIAGNOSIS — W19XXXA Unspecified fall, initial encounter: Secondary | ICD-10-CM | POA: Diagnosis not present

## 2021-02-02 DIAGNOSIS — Z8673 Personal history of transient ischemic attack (TIA), and cerebral infarction without residual deficits: Secondary | ICD-10-CM | POA: Diagnosis not present

## 2021-02-02 DIAGNOSIS — Z20822 Contact with and (suspected) exposure to covid-19: Secondary | ICD-10-CM | POA: Diagnosis not present

## 2021-02-02 DIAGNOSIS — G459 Transient cerebral ischemic attack, unspecified: Secondary | ICD-10-CM | POA: Diagnosis not present

## 2021-02-02 DIAGNOSIS — Z87891 Personal history of nicotine dependence: Secondary | ICD-10-CM | POA: Diagnosis not present

## 2021-02-02 DIAGNOSIS — I10 Essential (primary) hypertension: Secondary | ICD-10-CM | POA: Diagnosis not present

## 2021-02-02 DIAGNOSIS — F411 Generalized anxiety disorder: Secondary | ICD-10-CM | POA: Diagnosis not present

## 2021-02-02 DIAGNOSIS — R202 Paresthesia of skin: Secondary | ICD-10-CM | POA: Diagnosis not present

## 2021-02-02 DIAGNOSIS — I6523 Occlusion and stenosis of bilateral carotid arteries: Secondary | ICD-10-CM | POA: Diagnosis not present

## 2021-02-02 DIAGNOSIS — F329 Major depressive disorder, single episode, unspecified: Secondary | ICD-10-CM | POA: Diagnosis not present

## 2021-02-02 DIAGNOSIS — R531 Weakness: Secondary | ICD-10-CM | POA: Diagnosis not present

## 2021-02-02 DIAGNOSIS — Z7902 Long term (current) use of antithrombotics/antiplatelets: Secondary | ICD-10-CM | POA: Diagnosis not present

## 2021-02-02 DIAGNOSIS — E079 Disorder of thyroid, unspecified: Secondary | ICD-10-CM | POA: Diagnosis not present

## 2021-02-02 DIAGNOSIS — R0689 Other abnormalities of breathing: Secondary | ICD-10-CM | POA: Diagnosis not present

## 2021-02-02 DIAGNOSIS — R200 Anesthesia of skin: Secondary | ICD-10-CM | POA: Diagnosis not present

## 2021-02-02 DIAGNOSIS — Z79899 Other long term (current) drug therapy: Secondary | ICD-10-CM | POA: Diagnosis not present

## 2021-02-02 DIAGNOSIS — R001 Bradycardia, unspecified: Secondary | ICD-10-CM | POA: Diagnosis not present

## 2021-02-02 DIAGNOSIS — Z79890 Hormone replacement therapy (postmenopausal): Secondary | ICD-10-CM | POA: Diagnosis not present

## 2021-02-02 DIAGNOSIS — R42 Dizziness and giddiness: Secondary | ICD-10-CM | POA: Diagnosis not present

## 2021-02-02 DIAGNOSIS — Z7982 Long term (current) use of aspirin: Secondary | ICD-10-CM | POA: Diagnosis not present

## 2021-02-02 DIAGNOSIS — I639 Cerebral infarction, unspecified: Secondary | ICD-10-CM | POA: Diagnosis not present

## 2021-02-02 DIAGNOSIS — F10929 Alcohol use, unspecified with intoxication, unspecified: Secondary | ICD-10-CM | POA: Diagnosis not present

## 2021-02-02 NOTE — Telephone Encounter (Signed)
Mechele Collin called to report the patient is having symptoms of stroke this morning and is at [facility] in [place] at the ED undergoing testing.  Chenika Nevils, sister [phone], will be the contact, if needed.  Patient will not make her appointment today with Dr. Tomi Likens.

## 2021-02-03 DIAGNOSIS — M329 Systemic lupus erythematosus, unspecified: Secondary | ICD-10-CM | POA: Diagnosis not present

## 2021-02-03 DIAGNOSIS — F411 Generalized anxiety disorder: Secondary | ICD-10-CM | POA: Diagnosis not present

## 2021-02-03 DIAGNOSIS — G459 Transient cerebral ischemic attack, unspecified: Secondary | ICD-10-CM | POA: Diagnosis not present

## 2021-02-06 ENCOUNTER — Encounter (HOSPITAL_COMMUNITY): Payer: Self-pay | Admitting: Speech Pathology

## 2021-02-06 ENCOUNTER — Ambulatory Visit (HOSPITAL_COMMUNITY): Payer: No Typology Code available for payment source

## 2021-02-06 ENCOUNTER — Other Ambulatory Visit: Payer: Self-pay

## 2021-02-06 ENCOUNTER — Ambulatory Visit (HOSPITAL_COMMUNITY): Payer: No Typology Code available for payment source | Attending: Internal Medicine | Admitting: Speech Pathology

## 2021-02-06 DIAGNOSIS — M6281 Muscle weakness (generalized): Secondary | ICD-10-CM

## 2021-02-06 DIAGNOSIS — R41841 Cognitive communication deficit: Secondary | ICD-10-CM | POA: Insufficient documentation

## 2021-02-06 DIAGNOSIS — R2681 Unsteadiness on feet: Secondary | ICD-10-CM | POA: Insufficient documentation

## 2021-02-06 NOTE — Therapy (Signed)
[place], [place], [address] Phone: [phone]   Fax:  [phone]  Speech Language Pathology Treatment  Patient Details  Name: Teresa Galloway MRN: [id number] Date of Birth: 1956-03-07 No data recorded  Encounter Date: 02/06/2021   End of Session - 02/06/21 1856     Visit Number 4    Number of Visits 6    Date for SLP Re-Evaluation 02/15/21    Authorization Type [employer]   HEALTH TEAM [facility] EFF 04/09/2015 CURRENT CALENDAR YR  $30.00 CO-PAY  AUTH NOT REQ  OOP MAX $3,450.00/ $635.00 MET  NO VISIT LIMIT  NO AUTH REQ  NO CO-INS   SLP Start Time 2229    SLP Stop Time  1600    SLP Time Calculation (min) 45 min    Activity Tolerance Patient tolerated treatment well             Past Medical History:  Diagnosis Date   Acute pericarditis 12/2013   Bipolar II disorder    Possible diagnosis   Complication of anesthesia    Effects lasted for 3 days   Dyslipidemia    Generalized anxiety disorder with panic attacks    Hx of lithium toxicity    Hx of migraines    On propranolol.   Hypertension    Hypothyroid    Major depressive disorder 04/16/2013   Obstructive sleep apnea    Currently untreatred; oral device caused TMJ; unsuccessful with CPAP   Pericardial effusion 01/03/2014   Moderate without hemodynamic compromise.   Sinus tachycardia     Past Surgical History:  Procedure Laterality Date   BREAST SURGERY     CERVICAL FUSION     CHOLECYSTECTOMY     COLONOSCOPY WITH PROPOFOL N/A 01/16/2018   Procedure: COLONOSCOPY WITH PROPOFOL;  Surgeon: Rogene Houston, MD;  [facility];  Service: Endoscopy;  Laterality: N/A;  10:55   IR 3D INDEPENDENT WKST  12/13/2020   IR ANGIO INTRA EXTRACRAN SEL INTERNAL CAROTID BILAT MOD SED  12/13/2020   IR ANGIO VERTEBRAL SEL VERTEBRAL UNI L MOD SED  12/13/2020   IR CT HEAD LTD  12/13/2020   IR RADIOLOGIST EVAL & MGMT  11/30/2020   IR RADIOLOGIST EVAL & MGMT   01/10/2021   IR TRANSCATH/EMBOLIZ  12/13/2020   NASAL SINUS SURGERY     POLYPECTOMY  01/16/2018   Procedure: POLYPECTOMY;  Surgeon: Rogene Houston, MD;  [facility];  Service: Endoscopy;;  colon   RADIOLOGY WITH ANESTHESIA N/A 12/13/2020   Procedure: IR WITH ANESTHESIA EBOLIZATION;  Surgeon: Luanne Bras, MD;  Location: [facility];  Service: Radiology;  Laterality: N/A;   VAGINAL HYSTERECTOMY      There were no vitals filed for this visit.   Subjective Assessment - 02/06/21 1528     Subjective "Sometimes my mind just goes blank."    Currently in Pain? No/denies               ADULT SLP TREATMENT - 02/06/21 1853       General Information   Behavior/Cognition Alert;Cooperative;Pleasant mood    Patient Positioning Upright in chair    Oral care provided N/A    HPI Teresa Galloway is a 65 yo female who was referred for SLE by Dr. Jerene Bears following a recent hospitalization for S/P Left MCA aneurysm which occured on 12/13/20. Pt was hospitalized until 12/22/20. She discharged to SNF for 5 days and is now home.  She is now the caregiver for her mother who is on hospice for pancreatic cancer      Treatment Provided   Treatment provided Cognitive-Linquistic      Pain Assessment   Pain Assessment No/denies pain      Cognitive-Linquistic Treatment   Treatment focused on Cognition;Aphasia;Patient/family/caregiver education    Skilled Treatment Reinforcement of implementation of memory strategies provided with SLP encouragement to "brainstorm", take notes, free write her thoughts before imporant conversations with others. She is using her calendar and purchased a journal yesterday.      Assessment / Recommendations / Plan   Plan All goals met;Discharge SLP treatment due to (comment)      Progression Toward Goals   Progression toward goals Goals met, education completed, patient discharged from [facility] - 02/06/21 1858       SLP SHORT  TERM GOAL #1   Title Pt will implement word-finding strategies with 90% accuracy when unable to verbalize desired word in conversation/functional tasks with min assist.    Baseline 80%    Time 3    Period Weeks    Status Achieved    Target Date 02/15/21      SLP SHORT TERM GOAL #2   Title Pt will complete high-level word retrieval tasks with 95% acc and min assist    Baseline 80%    Time 3    Period Weeks    Status Achieved    Target Date 02/15/21      SLP SHORT TERM GOAL #3   Title Pt will complete moderate-level thought organization and planning activities with 90% acc and min assist.    Baseline Pt reports difficulty organizing thoughts to recount events to others    Time 3    Period Weeks    Status Achieved    Target Date 02/15/21      SLP SHORT TERM GOAL #4   Title Pt will implement memory strategies in functional therapy activities with 90% acc with min cues    Baseline reduced for details in recounting stories, 80%    Time 3    Period Weeks    Status Achieved    Target Date 02/15/21              SLP Long Term Goals - 01/30/21 1506       SLP LONG TERM GOAL #1   Title Same as short              Plan - 02/06/21 1857     Clinical Impression Statement Pt reports feeling like she is "just surviving" due to the stress she is under while caring for her mother with pancreatic cancer on hospice. She became tearful in session and continues to express feeling overwhelmed. She had her PT appointment and is motivated to improve her endurance, however feels that she needs to be at home with her mother right now. She has been given all the attention, memory, and thought formulation strategies and implements independently  in session. She reports that she still loses her train of thought at times, however this may also be due to the stress she is under. She has an appointment with her psychiatrist in a few weeks. Plan for d/c from SLP therapy at this time as she has met her  goals. She will reach back out to our office when she is ready for PT.  Duration --   3 weeks   Treatment/Interventions SLP instruction and feedback;Compensatory strategies;Patient/family education;Compensatory techniques    Potential to Achieve Goals Good    SLP Home Exercise Plan Pt will completed HEP as assigned to facilitate carryover of treatment strategies and techniques in home environment with use of written cues as needed.    Consulted and Agree with Plan of Care Patient             Patient will benefit from skilled therapeutic intervention in order to improve the following deficits and impairments:   Cognitive communication deficit    Problem List Patient Active Problem List   Diagnosis Date Noted   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   Brain aneurysm 12/13/2020   Bipolar II disorder ([place])    Obstructive sleep apnea    Hx of lithium toxicity    Irritable bowel syndrome 11/28/2017   Dyspareunia due to medical condition in female patient 12/19/2015   Atrophic vaginitis 12/19/2015   Pain in the chest    Acute pericarditis    Sinus tachycardia    Pericardial effusion, acute 01/03/2014   Hypothyroid    Hx of migraines    Generalized anxiety disorder with panic attacks    Dyslipidemia    Major depressive disorder 04/16/2013   SPEECH THERAPY DISCHARGE SUMMARY  Visits from Start of Care: 4  Current functional level related to goals / functional outcomes: Goals met   Remaining deficits: See above   Education / Equipment: complete   Patient agrees to discharge. Patient goals were met. Patient is being discharged due to meeting the stated rehab goals.Lujean Rave you,  Genene Churn, [place]  [facility] 02/06/2021, 7:00 PM  [facility] [address] [place], [place], [address] Phone: [phone]   Fax:  [phone]   Name: Teresa Galloway MRN: [id number] Date of Birth:  08/12/1955

## 2021-02-06 NOTE — Therapy (Signed)
[place], [place], [address] Phone: [phone]   Fax:  [phone]  Physical Therapy Evaluation  Patient Details  Name: Teresa Galloway MRN: [id number] Date of Birth: 01/25/1956 Referring Provider (PT): Jerene Bears, MD   Encounter Date: 02/06/2021   PT End of Session - 02/06/21 1344     Visit Number 1    Number of Visits 6    Date for PT Re-Evaluation 03/20/21    Authorization Type [employer]    PT Start Time 1345    PT Stop Time 1430    PT Time Calculation (min) 45 min    Activity Tolerance Patient tolerated treatment well    Behavior During Therapy [facility] for tasks assessed/performed             Past Medical History:  Diagnosis Date   Acute pericarditis 12/2013   Bipolar II disorder    Possible diagnosis   Complication of anesthesia    Effects lasted for 3 days   Dyslipidemia    Generalized anxiety disorder with panic attacks    Hx of lithium toxicity    Hx of migraines    On propranolol.   Hypertension    Hypothyroid    Major depressive disorder 04/16/2013   Obstructive sleep apnea    Currently untreatred; oral device caused TMJ; unsuccessful with CPAP   Pericardial effusion 01/03/2014   Moderate without hemodynamic compromise.   Sinus tachycardia     Past Surgical History:  Procedure Laterality Date   BREAST SURGERY     CERVICAL FUSION     CHOLECYSTECTOMY     COLONOSCOPY WITH PROPOFOL N/A 01/16/2018   Procedure: COLONOSCOPY WITH PROPOFOL;  Surgeon: Rogene Houston, MD;  [facility];  Service: Endoscopy;  Laterality: N/A;  10:55   IR 3D INDEPENDENT WKST  12/13/2020   IR ANGIO INTRA EXTRACRAN SEL INTERNAL CAROTID BILAT MOD SED  12/13/2020   IR ANGIO VERTEBRAL SEL VERTEBRAL UNI L MOD SED  12/13/2020   IR CT HEAD LTD  12/13/2020   IR RADIOLOGIST EVAL & MGMT  11/30/2020   IR RADIOLOGIST EVAL & MGMT  01/10/2021   IR TRANSCATH/EMBOLIZ  12/13/2020   NASAL SINUS SURGERY      POLYPECTOMY  01/16/2018   Procedure: POLYPECTOMY;  Surgeon: Rogene Houston, MD;  [facility];  Service: Endoscopy;;  colon   RADIOLOGY WITH ANESTHESIA N/A 12/13/2020   Procedure: IR WITH ANESTHESIA EBOLIZATION;  Surgeon: Luanne Bras, MD;  Location: [facility];  Service: Radiology;  Laterality: N/A;   VAGINAL HYSTERECTOMY      There were no vitals filed for this visit.    Subjective Assessment - 02/06/21 1348     Subjective Aneurysm repair in September 2022 and had subsequent complications resulting in several small CVA and experienced RLE/RUE paresis. Pt notes continued speech issues and BLE weakness and reports fatigue by the end of the day. Denies any falls.    Currently in Pain? No/denies                [facility] PT Assessment - 02/06/21 0001       Assessment   Medical Diagnosis Left MCA aneurysm    Referring Provider (PT) Jerene Bears, MD    Onset Date/Surgical Date 12/13/20    Hand Dominance Right      Balance Screen   Has the patient fallen in the past 6 months No    Has the patient had a  decrease in activity level because of a fear of falling?  No    Is the patient reluctant to leave their home because of a fear of falling?  No      Home Ecologist residence      Prior Function   Level of Independence Independent    Vocation Unemployed      Observation/Other Assessments   Observations requires furniture/assist for floor recovery      Sensation   Light Touch Appears Intact      Coordination   Gross Motor Movements are Fluid and Coordinated Yes    Fine Motor Movements are Fluid and Coordinated Yes      Functional Tests   Functional tests Single leg stance      Single Leg Stance   Comments 10 sec      Strength   Strength Assessment Site Hip;Knee    Right/Left Hip Right;Left    Right Hip Flexion 5/5    Right Hip Extension 5/5    Right Hip External Rotation  5/5    Right Hip Internal Rotation 5/5    Right Hip  ABduction 5/5    Right Hip ADduction 5/5    Left Hip Flexion 5/5    Left Hip Extension 5/5    Left Hip External Rotation 5/5    Left Hip Internal Rotation 5/5    Left Hip ABduction 5/5    Left Hip ADduction 5/5    Right/Left Knee Right;Left    Right Knee Flexion 5/5    Right Knee Extension 5/5    Left Knee Flexion 5/5    Left Knee Extension 5/5      Ambulation/Gait   Ambulation/Gait Yes    Ambulation/Gait Assistance 7: Independent      Standardized Balance Assessment   Standardized Balance Assessment Dynamic Gait Index;Berg Balance Test      Berg Balance Test   Sit to Stand Able to stand without using hands and stabilize independently    Standing Unsupported Able to stand safely 2 minutes    Sitting with Back Unsupported but Feet Supported on Floor or Stool Able to sit safely and securely 2 minutes    Stand to Sit Sits safely with minimal use of hands    Transfers Able to transfer safely, minor use of hands    Standing Unsupported with Eyes Closed Able to stand 10 seconds safely    Standing Unsupported with Feet Together Able to place feet together independently and stand 1 minute safely    From Standing, Reach Forward with Outstretched Arm Can reach confidently >25 cm (10")    From Standing Position, Pick up Object from Floor Able to pick up shoe safely and easily    From Standing Position, Turn to Look Behind Over each Shoulder Looks behind from both sides and weight shifts well    Turn 360 Degrees Able to turn 360 degrees safely in 4 seconds or less    Standing Unsupported, Alternately Place Feet on Step/Stool Able to stand independently and safely and complete 8 steps in 20 seconds    Standing Unsupported, One Foot in [employer] balance while stepping or standing    Standing on One Leg Able to lift leg independently and hold > 10 seconds    Total Score 52      Dynamic Gait Index   Level Surface Normal    Change in Gait Speed Mild Impairment    Gait with Horizontal Head  Turns Mild  Impairment    Gait with Vertical Head Turns Normal    Gait and Pivot Turn Mild Impairment    Step Over Obstacle Normal    Step Around Obstacles Normal    Steps Normal    Total Score 21                        Objective measurements completed on examination: See above findings.                PT Education - 02/06/21 1522     Education Details education on initiating home-based seated exercise program to improve activity tolerance    Person(s) Educated Patient    Methods Demonstration    Comprehension Verbalized understanding              PT Short Term Goals - 02/06/21 1517       PT SHORT TERM GOAL #1   Title Demo low risk for falls per 56/56 Berg Balance    Baseline 52/56    Time 3    Period Weeks    Status New    Target Date 02/27/21      PT SHORT TERM GOAL #2   Title Demo proper lifting mechanics coupled with breathing techniques to eliminate Valsalva maneuver    Time 3    Period Weeks    Status New    Target Date 02/27/21      PT SHORT TERM GOAL #3   Title Floor recovery with supervision for verbal cues    Baseline furniture or physical assistance    Time 3    Period Weeks    Status New    Target Date 02/27/21               PT Long Term Goals - 02/06/21 1520       PT LONG TERM GOAL #1   Title Demo independent floor recovery    Time 6    Period Weeks    Status New    Target Date 03/20/21      PT LONG TERM GOAL #2   Title Patient will demonstrate improved endurance by her report of being able to complete 3-4 hour shopping trip without LE fatigue    Baseline unable    Time 6    Period Weeks    Status New    Target Date 03/20/21                    Plan - 02/06/21 1511     Clinical Impression Statement Patient presents with mild balance and gait deficits as evidenced by DGI and Berg tests.  Demonstrates difficulty with performing mobility to and from floor requiring furniture for  support/assistanc. Pt reports limited endurance with onset of BLE fatigue after typical activity of 1-2 hours.  Pt would benefit from PT Services to train/instruct in HEP and improve body mechanics/strength to enable floor recovery mobility and train/instruct in lifting techniuqes to reduce risk for transient BP increase (Valsalva) during functional tasks    Personal Factors and Comorbidities Age;Comorbidity 1;Time since onset of injury/illness/exacerbation    Comorbidities CVA    Examination-Activity Limitations Carry;Squat;Locomotion Level    Examination-Participation Restrictions Community Activity;Shop    Stability/Clinical Decision Making Stable/Uncomplicated    Clinical Decision Making Low    Rehab Potential Good    PT Frequency 1x / week    PT Duration 6 weeks    PT Treatment/Interventions ADLs/Self Care Home Management;Gait training;Therapeutic  activities;Therapeutic exercise;Balance training;Neuromuscular re-education;Patient/family education    PT Next Visit Plan functional lifts with emphasis on proper breathing to avoid valsalva. Floor recovery technique    PT Home Exercise Plan recommend home fitness program via YouTube, e.g. 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   Brain aneurysm 12/13/2020   Bipolar II disorder ([place])    Obstructive sleep apnea    Hx of lithium toxicity    Irritable bowel syndrome 11/28/2017   Dyspareunia due to medical condition in female patient 12/19/2015   Atrophic  vaginitis 12/19/2015   Pain in the chest    Acute pericarditis    Sinus tachycardia    Pericardial effusion, acute 01/03/2014   Hypothyroid    Hx of migraines    Generalized anxiety disorder with panic attacks    Dyslipidemia    Major depressive disorder 04/16/2013    Toniann Fail, PT 02/06/2021, 3:23 PM  [facility] [address] [place], [place], [address] Phone: [phone]   Fax:  [phone]  Name: Teresa Galloway MRN: [id number] Date of Birth: Dec 19, 1955

## 2021-02-12 DIAGNOSIS — Z09 Encounter for follow-up examination after completed treatment for conditions other than malignant neoplasm: Secondary | ICD-10-CM | POA: Diagnosis not present

## 2021-02-12 DIAGNOSIS — I10 Essential (primary) hypertension: Secondary | ICD-10-CM | POA: Diagnosis not present

## 2021-02-12 DIAGNOSIS — G459 Transient cerebral ischemic attack, unspecified: Secondary | ICD-10-CM | POA: Diagnosis not present

## 2021-02-12 DIAGNOSIS — Z299 Encounter for prophylactic measures, unspecified: Secondary | ICD-10-CM | POA: Diagnosis not present

## 2021-02-23 ENCOUNTER — Encounter (HOSPITAL_COMMUNITY): Payer: PPO

## 2021-03-06 DIAGNOSIS — E039 Hypothyroidism, unspecified: Secondary | ICD-10-CM | POA: Diagnosis not present

## 2021-03-06 DIAGNOSIS — Z1331 Encounter for screening for depression: Secondary | ICD-10-CM | POA: Diagnosis not present

## 2021-03-06 DIAGNOSIS — I10 Essential (primary) hypertension: Secondary | ICD-10-CM | POA: Diagnosis not present

## 2021-03-06 DIAGNOSIS — Z79899 Other long term (current) drug therapy: Secondary | ICD-10-CM | POA: Diagnosis not present

## 2021-03-06 DIAGNOSIS — Z6821 Body mass index (BMI) 21.0-21.9, adult: Secondary | ICD-10-CM | POA: Diagnosis not present

## 2021-03-06 DIAGNOSIS — Z299 Encounter for prophylactic measures, unspecified: Secondary | ICD-10-CM | POA: Diagnosis not present

## 2021-03-06 DIAGNOSIS — Z7189 Other specified counseling: Secondary | ICD-10-CM | POA: Diagnosis not present

## 2021-03-06 DIAGNOSIS — Z1339 Encounter for screening examination for other mental health and behavioral disorders: Secondary | ICD-10-CM | POA: Diagnosis not present

## 2021-03-06 DIAGNOSIS — Z0000 Encounter for general adult medical examination without abnormal findings: Secondary | ICD-10-CM | POA: Diagnosis not present

## 2021-03-06 DIAGNOSIS — E7800 Pure hypercholesterolemia, unspecified: Secondary | ICD-10-CM | POA: Diagnosis not present

## 2021-03-07 ENCOUNTER — Encounter (HOSPITAL_COMMUNITY): Payer: PPO

## 2021-03-07 DIAGNOSIS — E7800 Pure hypercholesterolemia, unspecified: Secondary | ICD-10-CM | POA: Diagnosis not present

## 2021-03-07 DIAGNOSIS — Z79899 Other long term (current) drug therapy: Secondary | ICD-10-CM | POA: Diagnosis not present

## 2021-03-07 DIAGNOSIS — E559 Vitamin D deficiency, unspecified: Secondary | ICD-10-CM | POA: Diagnosis not present

## 2021-03-07 DIAGNOSIS — R5383 Other fatigue: Secondary | ICD-10-CM | POA: Diagnosis not present

## 2021-03-12 ENCOUNTER — Encounter (HOSPITAL_COMMUNITY): Payer: PPO

## 2021-03-14 DIAGNOSIS — R197 Diarrhea, unspecified: Secondary | ICD-10-CM | POA: Diagnosis not present

## 2021-03-14 DIAGNOSIS — Z6821 Body mass index (BMI) 21.0-21.9, adult: Secondary | ICD-10-CM | POA: Diagnosis not present

## 2021-03-14 DIAGNOSIS — R413 Other amnesia: Secondary | ICD-10-CM | POA: Diagnosis not present

## 2021-03-14 DIAGNOSIS — Z299 Encounter for prophylactic measures, unspecified: Secondary | ICD-10-CM | POA: Diagnosis not present

## 2021-03-14 DIAGNOSIS — K589 Irritable bowel syndrome without diarrhea: Secondary | ICD-10-CM | POA: Diagnosis not present

## 2021-03-19 ENCOUNTER — Encounter (HOSPITAL_COMMUNITY): Payer: PPO

## 2021-03-21 ENCOUNTER — Encounter (HOSPITAL_COMMUNITY): Payer: Self-pay

## 2021-03-21 DIAGNOSIS — Z299 Encounter for prophylactic measures, unspecified: Secondary | ICD-10-CM | POA: Diagnosis not present

## 2021-03-21 DIAGNOSIS — L03019 Cellulitis of unspecified finger: Secondary | ICD-10-CM | POA: Diagnosis not present

## 2021-03-21 DIAGNOSIS — I10 Essential (primary) hypertension: Secondary | ICD-10-CM | POA: Diagnosis not present

## 2021-03-21 DIAGNOSIS — R21 Rash and other nonspecific skin eruption: Secondary | ICD-10-CM | POA: Diagnosis not present

## 2021-03-21 DIAGNOSIS — Z6821 Body mass index (BMI) 21.0-21.9, adult: Secondary | ICD-10-CM | POA: Diagnosis not present

## 2021-03-21 NOTE — Therapy (Deleted)
[place], [place], [address] Phone: [phone]   Fax:  [phone]  Patient Details  Name: Teresa Galloway MRN: [id number] Date of Birth: [birth year] Referring Provider:  No ref. provider found  Encounter Date: 03/21/2021  PHYSICAL THERAPY DISCHARGE SUMMARY  Visits from Start of Care: 1  Current functional level related to goals / functional outcomes: Evaluation only   Remaining deficits: N/A   Education / Equipment: Pt did not return   Patient agrees to discharge. Patient goals were not met. Patient is being discharged due to not returning since the last visit.   Toniann Fail, PT 03/21/2021, 3:22 PM  [facility] [address] [place], [place], [address] Phone: [phone]   Fax:  [phone]

## 2021-03-26 ENCOUNTER — Encounter (HOSPITAL_COMMUNITY): Payer: PPO

## 2021-04-04 ENCOUNTER — Other Ambulatory Visit: Payer: Self-pay

## 2021-04-04 ENCOUNTER — Ambulatory Visit (HOSPITAL_COMMUNITY): Payer: No Typology Code available for payment source | Attending: Internal Medicine

## 2021-04-04 DIAGNOSIS — M6281 Muscle weakness (generalized): Secondary | ICD-10-CM

## 2021-04-04 DIAGNOSIS — R2681 Unsteadiness on feet: Secondary | ICD-10-CM

## 2021-04-04 NOTE — Therapy (Signed)
[place], [place], [address] Phone: [phone]   Fax:  [phone]  Physical Therapy Treatment and Recertification  Patient Details  Name: Teresa Galloway MRN: [id number] Date of Birth: 09-10-1955 Referring Provider (PT): Jerene Bears, MD   Encounter Date: 04/04/2021   PT End of Session - 04/04/21 0905     Visit Number 2    Number of Visits 6    Date for PT Re-Evaluation 05/02/21    Authorization Type [employer]    PT Start Time 0901    PT Stop Time 0945    PT Time Calculation (min) 44 min    Activity Tolerance Patient tolerated treatment well    Behavior During Therapy [facility] for tasks assessed/performed             Past Medical History:  Diagnosis Date   Acute pericarditis 12/2013   Bipolar II disorder    Possible diagnosis   Complication of anesthesia    Effects lasted for 3 days   Dyslipidemia    Generalized anxiety disorder with panic attacks    Hx of lithium toxicity    Hx of migraines    On propranolol.   Hypertension    Hypothyroid    Major depressive disorder 04/16/2013   Obstructive sleep apnea    Currently untreatred; oral device caused TMJ; unsuccessful with CPAP   Pericardial effusion 01/03/2014   Moderate without hemodynamic compromise.   Sinus tachycardia     Past Surgical History:  Procedure Laterality Date   BREAST SURGERY     CERVICAL FUSION     CHOLECYSTECTOMY     COLONOSCOPY WITH PROPOFOL N/A 01/16/2018   Procedure: COLONOSCOPY WITH PROPOFOL;  Surgeon: Rogene Houston, MD;  [facility];  Service: Endoscopy;  Laterality: N/A;  10:55   IR 3D INDEPENDENT WKST  12/13/2020   IR ANGIO INTRA EXTRACRAN SEL INTERNAL CAROTID BILAT MOD SED  12/13/2020   IR ANGIO VERTEBRAL SEL VERTEBRAL UNI L MOD SED  12/13/2020   IR CT HEAD LTD  12/13/2020   IR RADIOLOGIST EVAL & MGMT  11/30/2020   IR RADIOLOGIST EVAL & MGMT  01/10/2021   IR TRANSCATH/EMBOLIZ  12/13/2020   NASAL SINUS  SURGERY     POLYPECTOMY  01/16/2018   Procedure: POLYPECTOMY;  Surgeon: Rogene Houston, MD;  [facility];  Service: Endoscopy;;  colon   RADIOLOGY WITH ANESTHESIA N/A 12/13/2020   Procedure: IR WITH ANESTHESIA EBOLIZATION;  Surgeon: Luanne Bras, MD;  Location: [facility];  Service: Radiology;  Laterality: N/A;   VAGINAL HYSTERECTOMY      There were no vitals filed for this visit.   Subjective Assessment - 04/04/21 0907     Subjective Pt returns after absence caring for her mother. Pt notes continued balance issues ans with ambulating on stairs more when going down vs up. Pt denies any incidents of falls since this time. Notes she tends to sway when walking and bumps things    Currently in Pain? No/denies                [facility] PT Assessment - 04/04/21 0001       Assessment   Medical Diagnosis Left MCA aneurysm    Referring Provider (PT) Jerene Bears, MD    Onset Date/Surgical Date 12/13/20      Standardized Balance Assessment   Standardized Balance Assessment Dynamic Gait Index;[facility] Balance Test      [employer]  Sit to Stand Able to stand without using hands and stabilize independently    Standing Unsupported Able to stand safely 2 minutes    Sitting with Back Unsupported but Feet Supported on Floor or Stool Able to sit safely and securely 2 minutes    Stand to Sit Sits safely with minimal use of hands    Transfers Able to transfer safely, minor use of hands    Turn 360 Degrees Able to turn 360 degrees safely in 4 seconds or less                                    PT Education - 04/04/21 1131     Education Details education, instruction, discussion regarding access to psychological services    Person(s) Educated Patient    Methods Explanation;Handout    Comprehension Verbalized understanding              PT Short Term Goals - 04/04/21 1132       PT SHORT TERM GOAL #1   Title Demo low risk for falls per 56/56 Berg  Balance    Baseline 52/56    Time 3    Period Weeks    Status On-going    Target Date 02/27/21      PT SHORT TERM GOAL #2   Title Demo proper lifting mechanics coupled with breathing techniques to eliminate Valsalva maneuver    Time 3    Period Weeks    Status On-going    Target Date 02/27/21      PT SHORT TERM GOAL #3   Title Floor recovery with supervision for verbal cues    Baseline furniture or physical assistance    Time 3    Period Weeks    Status On-going    Target Date 02/27/21               PT Long Term Goals - 04/04/21 1132       PT LONG TERM GOAL #1   Title Demo independent floor recovery    Time 6    Period Weeks    Status On-going    Target Date 03/20/21      PT LONG TERM GOAL #2   Title Patient will demonstrate improved endurance by her report of being able to complete 3-4 hour shopping trip without LE fatigue    Baseline unable    Time 6    Period Weeks    Status On-going    Target Date 03/20/21                   Plan - 04/04/21 1130     Clinical Impression Statement Pt returns after absence and reports same symptoms of unsteadiness and feeling of general weakness.  Unable to complete full assessment as pt was emotionally distressed regarding life stress/family situations. Will resume typical POC at next session    Personal Factors and Comorbidities Age;Comorbidity 1;Time since onset of injury/illness/exacerbation    Comorbidities CVA    Examination-Activity Limitations Carry;Squat;Locomotion Level    Examination-Participation Restrictions Community Activity;Shop    Stability/Clinical Decision Making Stable/Uncomplicated    Rehab Potential Good    PT Frequency 1x / week    PT Duration 6 weeks    PT Treatment/Interventions ADLs/Self Care Home Management;Gait training;Therapeutic activities;Therapeutic exercise;Balance training;Neuromuscular re-education;Patient/family education    PT Next Visit Plan functional lifts with emphasis on  proper breathing to avoid  valsalva. Floor recovery technique    PT Home Exercise Plan recommend home fitness program via YouTube, e.g. 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   Brain aneurysm 12/13/2020   Bipolar II disorder ([place])    Obstructive sleep apnea    Hx of lithium toxicity    Irritable bowel syndrome 11/28/2017   Dyspareunia due to medical condition in female patient 12/19/2015   Atrophic vaginitis 12/19/2015   Pain in the chest    Acute pericarditis    Sinus tachycardia    Pericardial effusion, acute 01/03/2014   Hypothyroid    Hx of migraines    Generalized anxiety disorder with panic attacks    Dyslipidemia    Major depressive disorder 04/16/2013    Toniann Fail, PT 04/04/2021, 11:33 AM  [facility] [address] [place], [place], [address] Phone: [phone]   Fax:  [phone]  Name: Teresa Galloway MRN: [id number] Date of Birth: 02/20/1956

## 2021-04-17 DIAGNOSIS — F330 Major depressive disorder, recurrent, mild: Secondary | ICD-10-CM | POA: Diagnosis not present

## 2021-04-17 DIAGNOSIS — E039 Hypothyroidism, unspecified: Secondary | ICD-10-CM | POA: Diagnosis not present

## 2021-04-17 DIAGNOSIS — D692 Other nonthrombocytopenic purpura: Secondary | ICD-10-CM | POA: Diagnosis not present

## 2021-04-17 DIAGNOSIS — I10 Essential (primary) hypertension: Secondary | ICD-10-CM | POA: Diagnosis not present

## 2021-04-17 DIAGNOSIS — Z7902 Long term (current) use of antithrombotics/antiplatelets: Secondary | ICD-10-CM | POA: Diagnosis not present

## 2021-04-17 DIAGNOSIS — E785 Hyperlipidemia, unspecified: Secondary | ICD-10-CM | POA: Diagnosis not present

## 2021-04-17 DIAGNOSIS — G43909 Migraine, unspecified, not intractable, without status migrainosus: Secondary | ICD-10-CM | POA: Diagnosis not present

## 2021-04-17 DIAGNOSIS — Z79890 Hormone replacement therapy (postmenopausal): Secondary | ICD-10-CM | POA: Diagnosis not present

## 2021-04-18 ENCOUNTER — Ambulatory Visit (HOSPITAL_COMMUNITY): Payer: No Typology Code available for payment source | Attending: Internal Medicine

## 2021-04-18 ENCOUNTER — Other Ambulatory Visit: Payer: Self-pay

## 2021-04-18 DIAGNOSIS — M6281 Muscle weakness (generalized): Secondary | ICD-10-CM | POA: Insufficient documentation

## 2021-04-18 DIAGNOSIS — R2681 Unsteadiness on feet: Secondary | ICD-10-CM

## 2021-04-18 NOTE — Therapy (Signed)
[place] °[facility] °[address] °[place], [place], [address] °Phone: [phone]   Fax:  [phone] ° °Physical Therapy Treatment ° °Patient Details  °Name: Teresa Galloway °MRN: [id number] °Date of Birth: 01/12/1956 °Referring Provider (PT): Vyas Dhruv, MD ° ° °Encounter Date: 04/18/2021 ° ° PT End of Session - 04/18/21 0952   ° ° Visit Number 3   ° Number of Visits 6   ° Date for PT Re-Evaluation 05/02/21   ° Authorization Type [employer]   ° PT Start Time 0949   ° PT Stop Time 1030   ° PT Time Calculation (min) 41 min   ° Activity Tolerance Patient tolerated treatment well   ° Behavior During Therapy WFL for tasks assessed/performed   ° °  °  ° °  ° ° °Past Medical History:  °Diagnosis Date  ° Acute pericarditis 12/2013  ° Bipolar II disorder   ° Possible diagnosis  ° Complication of anesthesia   ° Effects lasted for 3 days  ° Dyslipidemia   ° Generalized anxiety disorder with panic attacks   ° Hx of lithium toxicity   ° Hx of migraines   ° On propranolol.  ° Hypertension   ° Hypothyroid   ° Major depressive disorder 04/16/2013  ° Obstructive sleep apnea   ° Currently untreatred; oral device caused TMJ; unsuccessful with CPAP  ° Pericardial effusion 01/03/2014  ° Moderate without hemodynamic compromise.  ° Sinus tachycardia   ° ° °Past Surgical History:  °Procedure Laterality Date  ° BREAST SURGERY    ° CERVICAL FUSION    ° CHOLECYSTECTOMY    ° COLONOSCOPY WITH PROPOFOL N/A 01/16/2018  ° Procedure: COLONOSCOPY WITH PROPOFOL;  Surgeon: Rehman, Najeeb U, MD;  [facility];  Service: Endoscopy;  Laterality: N/A;  10:55  ° IR 3D INDEPENDENT WKST  12/13/2020  ° IR ANGIO INTRA EXTRACRAN SEL INTERNAL CAROTID BILAT MOD SED  12/13/2020  ° IR ANGIO VERTEBRAL SEL VERTEBRAL UNI L MOD SED  12/13/2020  ° IR CT HEAD LTD  12/13/2020  ° IR RADIOLOGIST EVAL & MGMT  11/30/2020  ° IR RADIOLOGIST EVAL & MGMT  01/10/2021  ° IR TRANSCATH/EMBOLIZ  12/13/2020  ° NASAL SINUS SURGERY    °  POLYPECTOMY  01/16/2018  ° Procedure: POLYPECTOMY;  Surgeon: Rehman, Najeeb U, MD;  [facility];  Service: Endoscopy;;  colon  ° RADIOLOGY WITH ANESTHESIA N/A 12/13/2020  ° Procedure: IR WITH ANESTHESIA EBOLIZATION;  Surgeon: Deveshwar, Sanjeev, MD;  [facility];  Service: Radiology;  Laterality: N/A;  ° VAGINAL HYSTERECTOMY    ° ° °There were no vitals filed for this visit. ° ° Subjective Assessment - 04/18/21 0955   ° ° Subjective Pt notes continued balance deficits and noted when walking outside she noticed listing to the side when walking and turning head   ° Currently in Pain? No/denies   ° °  °  ° °  ° ° ° ° ° ° ° ° ° ° ° ° ° ° ° ° ° ° ° ° [facility] Adult PT Treatment/Exercise - 04/18/21 0001   ° °  ° Ambulation/Gait  ° Ambulation/Gait Yes   ° Stairs Yes   ° Stairs Assistance 6: Modified independent (Device/Increase time)   ° Stair Management Technique No rails;One rail Right;Two rails   ° Curb 7: Independent   ° Gait Comments retrowalking for balance/position sense. Dynamic gait activities with head turns, suddden start/stop   °  °   Exercises  ° Exercises Knee/Hip   °  ° Knee/Hip Exercises: Standing  ° Lateral Step Up Right;2 sets;10 reps   ° Functional Squat 20 reps   ° Functional Squat Limitations with mat table UE support and cues for feet flat   ° Other Standing Knee Exercises single leg sit to stand from EOM: right knee wobble present   ° Other Standing Knee Exercises static standing on airex pad eyes open/closed, withstanding postural perturbations, overhead press 1x10 with 2kg ball. Offset standing performing head/eye turns 2x5 reps to improve balance/righting reaction   ° °  °  ° °  ° ° ° ° ° ° ° ° ° ° ° ° PT Short Term Goals - 04/04/21 1132   ° °  ° PT SHORT TERM GOAL #1  ° Title Demo low risk for falls per 56/56 Berg Balance   ° Baseline 52/56   ° Time 3   ° Period Weeks   ° Status On-going   ° Target Date 02/27/21   °  ° PT SHORT TERM GOAL #2  ° Title Demo proper lifting mechanics coupled  with breathing techniques to eliminate Valsalva maneuver   ° Time 3   ° Period Weeks   ° Status On-going   ° Target Date 02/27/21   °  ° PT SHORT TERM GOAL #3  ° Title Floor recovery with supervision for verbal cues   ° Baseline furniture or physical assistance   ° Time 3   ° Period Weeks   ° Status On-going   ° Target Date 02/27/21   ° °  °  ° °  ° ° ° ° PT Long Term Goals - 04/04/21 1132   ° °  ° PT LONG TERM GOAL #1  ° Title Demo independent floor recovery   ° Time 6   ° Period Weeks   ° Status On-going   ° Target Date 03/20/21   °  ° PT LONG TERM GOAL #2  ° Title Patient will demonstrate improved endurance by her report of being able to complete 3-4 hour shopping trip without LE fatigue   ° Baseline unable   ° Time 6   ° Period Weeks   ° Status On-going   ° Target Date 03/20/21   ° °  °  ° °  ° ° ° ° ° ° ° ° Plan - 04/18/21 1035   ° ° Clinical Impression Statement Demonstrates difficulty with RLE stance control/strength deficits with knee wobble manifest with eccentric lowering causing her some imbalance with stairs per her report.  Notes LOB when walking and performing head turns. Tx focus on improving RLE strength/control and performing static balance activities coupled with head turns to facilitate balance/righting reactions. Difficulty maintaining foot flat during squats with tendency for knees over toes and difficulty in coming to stand from position. Multimodal cues for proper form   ° Personal Factors and Comorbidities Age;Comorbidity 1;Time since onset of injury/illness/exacerbation   ° Comorbidities CVA   ° Examination-Activity Limitations Carry;Squat;Locomotion Level   ° Examination-Participation Restrictions Community Activity;Shop   ° Stability/Clinical Decision Making Stable/Uncomplicated   ° Rehab Potential Good   ° PT Frequency 1x / week   ° PT Duration 6 weeks   ° PT Treatment/Interventions ADLs/Self Care Home Management;Gait training;Therapeutic activities;Therapeutic exercise;Balance  training;Neuromuscular re-education;Patient/family education   ° PT Next Visit Plan functional lifts with emphasis on proper breathing to avoid valsalva. Floor recovery technique   ° PT Home Exercise Plan recommend home fitness   program via YouTube, e.g. 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   Brain aneurysm 12/13/2020   Bipolar II disorder ([place])    Obstructive sleep apnea    Hx of lithium toxicity    Irritable bowel syndrome 11/28/2017   Dyspareunia due to medical condition in female patient 12/19/2015   Atrophic vaginitis 12/19/2015   Pain in the chest    Acute pericarditis    Sinus tachycardia    Pericardial effusion, acute 01/03/2014   Hypothyroid    Hx of migraines    Generalized anxiety disorder with panic attacks    Dyslipidemia    Major depressive disorder 04/16/2013    Toniann Fail, PT 04/18/2021, 10:40 AM  [place] [address] [place], [place], [address] Phone: [phone]   Fax:  [phone]  Name: Teresa Galloway MRN: [id number] Date of Birth: Apr 21, 1955

## 2021-04-24 ENCOUNTER — Encounter (HOSPITAL_COMMUNITY): Payer: PPO

## 2021-04-24 ENCOUNTER — Telehealth (HOSPITAL_COMMUNITY): Payer: Self-pay

## 2021-04-24 NOTE — Telephone Encounter (Signed)
No call, no show.  10:08 AM, 04/24/21 M. Sherlyn Lees, PT, DPT Physical Therapist- [place] Office Number: [phone]

## 2021-04-27 ENCOUNTER — Other Ambulatory Visit (HOSPITAL_COMMUNITY): Payer: Self-pay | Admitting: Interventional Radiology

## 2021-04-27 DIAGNOSIS — I639 Cerebral infarction, unspecified: Secondary | ICD-10-CM

## 2021-05-01 ENCOUNTER — Ambulatory Visit (HOSPITAL_COMMUNITY): Payer: No Typology Code available for payment source

## 2021-05-01 ENCOUNTER — Other Ambulatory Visit: Payer: Self-pay

## 2021-05-01 DIAGNOSIS — M6281 Muscle weakness (generalized): Secondary | ICD-10-CM | POA: Diagnosis not present

## 2021-05-01 DIAGNOSIS — R2681 Unsteadiness on feet: Secondary | ICD-10-CM

## 2021-05-01 NOTE — Therapy (Signed)
[place] °[facility] °[address] °[place], [place], [address] °Phone: [phone]   Fax:  [phone] ° °Physical Therapy Treatment and D/C Summary ° °Patient Details  °Name: Teresa Galloway °MRN: [id number] °Date of Birth: 12/31/1955 °Referring Provider (PT): Vyas Dhruv, MD ° °PHYSICAL THERAPY DISCHARGE SUMMARY ° °Visits from Start of Care: 4 ° °Current functional level related to goals / functional outcomes: °Met STG/LTG, no functional limitations °  °Remaining deficits: °None, low risk for falls °  °Education / Equipment: °HEP  ° °Patient agrees to discharge. Patient goals were met. Patient is being discharged due to meeting the stated rehab goals. ° °Encounter Date: 05/01/2021 ° ° PT End of Session - 05/01/21 1033   ° ° Visit Number 4   ° Number of Visits 6   ° Date for PT Re-Evaluation 05/02/21   ° Authorization Type [employer]   ° PT Start Time 1032   ° PT Stop Time 1115   ° PT Time Calculation (min) 43 min   ° Activity Tolerance Patient tolerated treatment well   ° Behavior During Therapy WFL for tasks assessed/performed   ° °  °  ° °  ° ° °Past Medical History:  °Diagnosis Date  ° Acute pericarditis 12/2013  ° Bipolar II disorder   ° Possible diagnosis  ° Complication of anesthesia   ° Effects lasted for 3 days  ° Dyslipidemia   ° Generalized anxiety disorder with panic attacks   ° Hx of lithium toxicity   ° Hx of migraines   ° On propranolol.  ° Hypertension   ° Hypothyroid   ° Major depressive disorder 04/16/2013  ° Obstructive sleep apnea   ° Currently untreatred; oral device caused TMJ; unsuccessful with CPAP  ° Pericardial effusion 01/03/2014  ° Moderate without hemodynamic compromise.  ° Sinus tachycardia   ° ° °Past Surgical History:  °Procedure Laterality Date  ° BREAST SURGERY    ° CERVICAL FUSION    ° CHOLECYSTECTOMY    ° COLONOSCOPY WITH PROPOFOL N/A 01/16/2018  ° Procedure: COLONOSCOPY WITH PROPOFOL;  Surgeon: Rehman, Najeeb U, MD;  [facility];  Service: Endoscopy;  Laterality: N/A;  10:55  ° IR 3D INDEPENDENT WKST  12/13/2020  ° IR ANGIO INTRA EXTRACRAN SEL INTERNAL CAROTID BILAT MOD SED  12/13/2020  ° IR ANGIO VERTEBRAL SEL VERTEBRAL UNI L MOD SED  12/13/2020  ° IR CT HEAD LTD  12/13/2020  ° IR RADIOLOGIST EVAL & MGMT  11/30/2020  ° IR RADIOLOGIST EVAL & MGMT  01/10/2021  ° IR TRANSCATH/EMBOLIZ  12/13/2020  ° NASAL SINUS SURGERY    ° POLYPECTOMY  01/16/2018  ° Procedure: POLYPECTOMY;  Surgeon: Rehman, Najeeb U, MD;  [facility];  Service: Endoscopy;;  colon  ° RADIOLOGY WITH ANESTHESIA N/A 12/13/2020  ° Procedure: IR WITH ANESTHESIA EBOLIZATION;  Surgeon: Deveshwar, Sanjeev, MD;  [facility];  Service: Radiology;  Laterality: N/A;  ° VAGINAL HYSTERECTOMY    ° ° °There were no vitals filed for this visit. ° ° Subjective Assessment - 05/01/21 1105   ° ° Subjective Been more active and walking outside and able to walk down steep hill without difficulty   ° Currently in Pain? No/denies   ° °  °  ° °  ° ° ° ° ° [facility] PT Assessment - 05/01/21 0001   ° °  ° Assessment  ° Medical Diagnosis Left MCA aneurysm   ° Referring Provider (PT) Vyas Dhruv,   MD   °  ° Ambulation/Gait  ° Ambulation/Gait Yes   ° Ambulation/Gait Assistance 7: Independent   ° Ambulation Distance (Feet) 450 Feet   ° Assistive device None   ° Gait Pattern Within Functional Limits   ° Ambulation Surface Level;Indoor   ° Gait velocity normal   ° Stairs Yes   ° Stairs Assistance 7: Independent   ° Stair Management Technique No rails   ° Curb 7: Independent   °  ° Berg Balance Test  ° Sit to Stand Able to stand without using hands and stabilize independently   ° Standing Unsupported Able to stand safely 2 minutes   ° Sitting with Back Unsupported but Feet Supported on Floor or Stool Able to sit safely and securely 2 minutes   ° Stand to Sit Sits safely with minimal use of hands   ° Transfers Able to transfer safely, minor use of hands   ° Standing Unsupported with Eyes Closed Able to stand 10  seconds safely   ° Standing Unsupported with Feet Together Able to place feet together independently and stand 1 minute safely   ° From Standing, Reach Forward with Outstretched Arm Can reach confidently >25 cm (10")   ° From Standing Position, Pick up Object from Floor Able to pick up shoe safely and easily   ° From Standing Position, Turn to Look Behind Over each Shoulder Looks behind from both sides and weight shifts well   ° Turn 360 Degrees Able to turn 360 degrees safely in 4 seconds or less   ° Standing Unsupported, Alternately Place Feet on Step/Stool Able to stand independently and safely and complete 8 steps in 20 seconds   ° Standing Unsupported, One Foot in Front Able to place foot tandem independently and hold 30 seconds   ° Standing on One Leg Able to lift leg independently and hold > 10 seconds   ° Total Score 56   °  ° Dynamic Gait Index  ° Level Surface Normal   ° Change in Gait Speed Normal   ° Gait with Horizontal Head Turns Normal   ° Gait with Vertical Head Turns Normal   ° Gait and Pivot Turn Normal   ° Step Over Obstacle Normal   ° Step Around Obstacles Normal   ° Steps Normal   ° Total Score 24   ° °  °  ° °  ° ° ° ° ° ° ° ° ° ° ° ° ° ° ° ° ° ° ° ° ° ° ° ° ° ° ° PT Short Term Goals - 05/01/21 1049   ° °  ° PT SHORT TERM GOAL #1  ° Title Demo low risk for falls per 56/56 Berg Balance   ° Baseline 52/56 at SOC, 56/56   ° Time 3   ° Period Weeks   ° Status Achieved   ° Target Date 02/27/21   °  ° PT SHORT TERM GOAL #2  ° Title Demo proper lifting mechanics coupled with breathing techniques to eliminate Valsalva maneuver   ° Baseline excellent body mechanics   ° Time 3   ° Period Weeks   ° Status Achieved   ° Target Date 02/27/21   °  ° PT SHORT TERM GOAL #3  ° Title Floor recovery with supervision for verbal cues   ° Baseline furnitire   ° Time 3   ° Period Weeks   ° Status Achieved   ° Target Date 02/27/21   ° °  °  ° °  ° ° ° °   PT Long Term Goals - 05/01/21 1106   ° °  ° PT LONG TERM GOAL #1   ° Title Demo independent floor recovery   ° Baseline Independent   ° Time 6   ° Period Weeks   ° Status Achieved   ° Target Date 03/20/21   °  ° PT LONG TERM GOAL #2  ° Title Patient will demonstrate improved endurance by her report of being able to complete 3-4 hour shopping trip without LE fatigue   ° Baseline no issue   ° Time 6   ° Period Weeks   ° Status Achieved   ° Target Date 03/20/21   ° °  °  ° °  ° ° ° ° ° ° ° ° ° °Patient will benefit from skilled therapeutic intervention in order to improve the following deficits and impairments:    ° °Visit Diagnosis: °Muscle weakness (generalized) ° °Unsteadiness on feet ° ° ° ° °Problem List °Patient Active Problem List  ° Diagnosis Date Noted  ° 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  ° Brain aneurysm 12/13/2020  ° Bipolar II disorder ([facility])   ° Obstructive sleep apnea   ° Hx of lithium toxicity   ° Irritable bowel syndrome 11/28/2017  ° Dyspareunia due to medical condition in female patient 12/19/2015  ° Atrophic vaginitis 12/19/2015  ° Pain in the chest   ° Acute pericarditis   ° Sinus tachycardia   ° Pericardial effusion, acute 01/03/2014  ° Hypothyroid   ° Hx of migraines   ° Generalized anxiety disorder with panic attacks   ° Dyslipidemia   ° Major depressive disorder 04/16/2013  ° ° ° K , PT °05/01/2021, 11:09 AM ° °[place] °[facility] °[address] °[place], [place], [address] °Phone: [phone]   Fax:  [phone] ° °Name: Teresa Galloway °MRN: [id number] °Date of Birth: 10/15/1955 ° ° ° °

## 2021-05-02 ENCOUNTER — Ambulatory Visit (HOSPITAL_COMMUNITY)
Admission: RE | Admit: 2021-05-02 | Discharge: 2021-05-02 | Disposition: A | Discharge status: Home / Self Care | Payer: No Typology Code available for payment source | Source: Ambulatory Visit | Attending: Interventional Radiology | Admitting: Interventional Radiology

## 2021-05-02 ENCOUNTER — Encounter (HOSPITAL_COMMUNITY): Payer: Self-pay

## 2021-05-02 DIAGNOSIS — I639 Cerebral infarction, unspecified: Secondary | ICD-10-CM | POA: Diagnosis not present

## 2021-05-02 MED ORDER — GADOBUTROL 1 MMOL/ML IV SOLN
6.0000 mL | Freq: Once | INTRAVENOUS | Status: AC | PRN
  Administered 2021-05-02: 12:00:00 6 mL via INTRAVENOUS

## 2021-05-03 DIAGNOSIS — Z961 Presence of intraocular lens: Secondary | ICD-10-CM | POA: Diagnosis not present

## 2021-05-03 DIAGNOSIS — H52223 Regular astigmatism, bilateral: Secondary | ICD-10-CM | POA: Diagnosis not present

## 2021-05-03 DIAGNOSIS — H524 Presbyopia: Secondary | ICD-10-CM | POA: Diagnosis not present

## 2021-05-03 DIAGNOSIS — H40053 Ocular hypertension, bilateral: Secondary | ICD-10-CM | POA: Diagnosis not present

## 2021-05-03 DIAGNOSIS — H0288B Meibomian gland dysfunction left eye, upper and lower eyelids: Secondary | ICD-10-CM | POA: Diagnosis not present

## 2021-05-03 DIAGNOSIS — H5202 Hypermetropia, left eye: Secondary | ICD-10-CM | POA: Diagnosis not present

## 2021-05-03 DIAGNOSIS — H0288A Meibomian gland dysfunction right eye, upper and lower eyelids: Secondary | ICD-10-CM | POA: Diagnosis not present

## 2021-05-03 DIAGNOSIS — H16223 Keratoconjunctivitis sicca, not specified as Sjogren's, bilateral: Secondary | ICD-10-CM | POA: Diagnosis not present

## 2021-05-03 DIAGNOSIS — H04123 Dry eye syndrome of bilateral lacrimal glands: Secondary | ICD-10-CM | POA: Diagnosis not present

## 2021-05-03 DIAGNOSIS — H5211 Myopia, right eye: Secondary | ICD-10-CM | POA: Diagnosis not present

## 2021-05-03 DIAGNOSIS — H40013 Open angle with borderline findings, low risk, bilateral: Secondary | ICD-10-CM | POA: Diagnosis not present

## 2021-05-03 DIAGNOSIS — Z9849 Cataract extraction status, unspecified eye: Secondary | ICD-10-CM | POA: Diagnosis not present

## 2021-05-07 ENCOUNTER — Other Ambulatory Visit (HOSPITAL_COMMUNITY): Payer: Self-pay | Admitting: Interventional Radiology

## 2021-05-07 DIAGNOSIS — I639 Cerebral infarction, unspecified: Secondary | ICD-10-CM

## 2021-05-08 ENCOUNTER — Other Ambulatory Visit (HOSPITAL_COMMUNITY): Payer: Self-pay | Admitting: Radiology

## 2021-05-08 DIAGNOSIS — M6281 Muscle weakness (generalized): Secondary | ICD-10-CM | POA: Diagnosis not present

## 2021-05-08 DIAGNOSIS — I671 Cerebral aneurysm, nonruptured: Secondary | ICD-10-CM

## 2021-05-10 ENCOUNTER — Ambulatory Visit (HOSPITAL_COMMUNITY)
Admission: RE | Admit: 2021-05-10 | Discharge: 2021-05-10 | Disposition: A | Discharge status: Home / Self Care | Payer: No Typology Code available for payment source | Source: Ambulatory Visit | Attending: Interventional Radiology | Admitting: Interventional Radiology

## 2021-05-10 ENCOUNTER — Other Ambulatory Visit: Payer: Self-pay

## 2021-05-10 DIAGNOSIS — I639 Cerebral infarction, unspecified: Secondary | ICD-10-CM

## 2021-05-10 DIAGNOSIS — I63512 Cerebral infarction due to unspecified occlusion or stenosis of left middle cerebral artery: Secondary | ICD-10-CM | POA: Diagnosis not present

## 2021-05-10 LAB — PLATELET INHIBITION P2Y12

## 2021-05-14 HISTORY — PX: IR RADIOLOGIST EVAL & MGMT: IMG5224

## 2021-05-15 NOTE — Progress Notes (Signed)
Virtual Visit via Video Note The purpose of this virtual visit is to provide medical care while limiting exposure to the novel coronavirus.    Consent was obtained for video visit:  Yes.   Answered questions that patient had about telehealth interaction:  Yes.   I discussed the limitations, risks, security and privacy concerns of performing an evaluation and management service by telemedicine. I also discussed with the patient that there may be a patient responsible charge related to this service. The patient expressed understanding and agreed to proceed.  Pt location: Home Physician Location: office Name of referring provider:  Glenda Chroman, MD I connected with Teresa Galloway at patients initiation/request on 05/16/2021 at 10:30 AM EST by video enabled telemedicine application and verified that I am speaking with the correct person using two identifiers. 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 Hypertension Hyperlipidemia Restless leg syndrome Probable left-sided V1 trigeminal neuralgia   1  Secondary stroke prevention: -  ASA 81mg  daily and Plavix 37.5mg  every other day as per Dr. Estanislado Pandy - Atorvastatin 40mg  daily.  LDL goal less than 70. - Normotensive blood pressure - Hgb A1c goal less than 7 4.  RLS:  Lyrica 150mg  BID 5   Trigeminal neuralgia:  Baclofen 5mg  BID 6   Due to cerebral aneurysm and history of stroke, advised to discontinue rizatriptan. 7  Follow up 5 months.  History of Present Illness:  Teresa Galloway is a 66 year old female with IBS, migraine, depression, anxiety, Bipolar disorder and OSA   UPDATE: Dizzy nauseous, passed out -  She was admitted to [facility] for suspected TIA.  She felt dizzy and massed out.  No lateralizing symptoms.  CTA head and neck revealed postsurgical changes  secondary to pipeline stent of MCA bifurcation aneurysm  with unchanged persistent filling of treated aneurysm measuring 4 x 3 mm.  No emergent large vessel occlusion.  MRI of brain showed showed no acute infarct or hemorrhage.  She was observed overnight   She had follow up MRI/MRA of head on 05/02/2021 personally reviewed which still revealed 4 x 3 mm left MCA bifurcation aneurysm, confirmed (3.3-3.6 mm) on cerebral arteriogram on 05/14/2021.  Advised by Dr. Estanislado Pandy to take ASA 81mg  daily and Plavix 37.5mg  every other day (P2Y12 level was 86).  There remains discussion what the plan is going forward.   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  History of Lithium toxicity in 2020, presenting with memory deficits/word-finding difficulty, profuse sweating, left arm pain and GI issues.  She stopped Lithium.  However, she continues to have profuse sweating, tingling itch across the face and hands (like a feather running across her skin.  She still has tingling and aching pain radiating down the left arm from shoulder down to the entire hand. No weakness.  No neck pain.  She does have history of cervical  C5-C6-C7 spine fusion 10 years ago.  01/28/2019 NCV-EMG:  normal.  02/19/2019 MRI Cervical Spine Wo:  ACDF C5-6 and C6-7 without stenosis; 2 mm anterolisthesis with central disc protrusion, uncinate spurring and moderate facet degeneration at C4-5 causing mild spinal stenosis and moderate foraminal stenosis bilaterally.  She started seeing a chiropractor which has been helpful.  If she turns  her head to the left, she feels the pain and numbness down the left shoulder and to the fingertips.     Restless Leg Syndrome: She has restless leg syndrome which lasts all day.  12/30/2018 LABS:  Ferritin 65, TSH 1.00, B12 >2,000.    Possible Left-sided V1 Trigeminal Neuralgia and Right-sided Facial  Numbness. In 2021, she developed a twitch in her left eye.  She later developed a paroxysmal stabbing pain starting on top left parietal region radiating down to left eye.  It would last a couple of seconds.  It occurs several times a day (6 to 20 times a day).  Some left sided facial numbness.  No associated facial numbness, facial weakness or autonomic symptoms involving the eye.  No triggers.  She subsequently developed right-sided facial numbness.    Stroke: Due to headaches and left facial numbness, she had an MRI and MRA of the brain on 11/25/2020 which revealed mild-moderate chronic small vessel ischemic changes and remote small left cerebellar infarct as well as a 4 x 4 x 2 mm left MCA bifurcation aneurysm.  On 12/13/2020, she underwent endovascular treatment for the aneurysm.  Following the procedure, it was noted that she exhibited right sided weakness, vision loss, some word-finding difficulty with paraphasic errors and slight hesitancy of speech.  No unilateral numbness or weakness.  MRI of brain showed scattered acute infarcts within the left MCA distribution.  MRA of head showed sequelae of interval endovascular treatment of the aneurysm with pipeline flow diverter stent without any hemodynamically significant stenosis.  Echocardiogram showed EF 60-65% but intraatrial septum was not well-visualized.  Lower extremity venous ultrasound negative for DVT.  LDL was 101, Hgb A1c 5.2 and SARS Coronavirus 2 negative.  She was already taking ASA 81mg  on day of admission.  She was discharged on ASA 81mg  and Plavix 75mg  and started on atorvastatin 40mg .  Due to heavy and large bruising/hematomas, Plavix was reduced to 37.5mg  daily while she was in rehab.  She cannot remember much during her 3 days in the hospital.  She reports visual aura described as sparkles and pinwheels in her vision.  She reports weakness of her right ankle.   Subjective Memory Deficits: She continued to have short-term memory deficits.   12/30/2018 LABS:  TSH 1.00, B12 >2,000.  She underwent neuropsychological testing with Dr. Melvyn Novas on 03/25/2019, which did not demonstrate evidence of neurocognitive deficits.  Symptoms were thought to be possibly related to severe depression and anxiety and pharmacologic effect.      Past medications:  lithium; gabapentin (muscle jerks), ropinrole (lost efficacy), Cymbalta, melatonin (she feels "wired"), indomethacin (side effects, ineffective), pramipexole (discontinued due to augmentation)  Past Medical History: Past Medical History:  Diagnosis Date   Acute pericarditis 12/2013   Bipolar II disorder    Possible diagnosis   Complication of anesthesia    Effects lasted for 3 days   Dyslipidemia    Generalized anxiety disorder with panic attacks    Hx of lithium toxicity    Hx of migraines    On propranolol.   Hypertension    Hypothyroid    Major depressive disorder 04/16/2013   Obstructive sleep apnea    Currently untreatred; oral device caused TMJ; unsuccessful with CPAP   Pericardial effusion 01/03/2014   Moderate without hemodynamic compromise.   Sinus tachycardia     Medications: Outpatient Encounter Medications as of 05/16/2021  Medication Sig   ALPRAZolam (XANAX) 0.25 MG tablet Take 0.25 mg by mouth 4 (four) times  daily as needed for anxiety.    aspirin EC 81 MG tablet Take 81 mg by mouth daily. Swallow whole.   atorvastatin (LIPITOR) 40 MG tablet Take 1 tablet (40 mg total) by mouth daily.   Baclofen 5 MG TABS Take 5 mg by mouth 3 (three) times daily. (Patient taking differently: Take 5 mg by mouth in the morning and at bedtime.)   carboxymethylcellulose (REFRESH PLUS) 0.5 % SOLN Place 1 drop into both eyes 3 (three) times daily as needed (dry eyes).   clopidogrel (PLAVIX) 75 MG tablet Take 0.5 tablets (37.5 mg total) by mouth daily.   dicyclomine (BENTYL) 10 MG capsule Take 1 capsule (10 mg total) by mouth 3 (three) times daily before meals. (Patient taking differently: Take  10 mg by mouth 3 (three) times daily as needed for spasms.)   diphenhydrAMINE (BENADRYL) 25 MG tablet Take 25 mg by mouth daily as needed for allergies.   DULoxetine (CYMBALTA) 60 MG capsule Take 60 mg by mouth daily.   EPIPEN 2-PAK 0.3 MG/0.3ML SOAJ injection Inject 0.3 mg into the muscle as needed for anaphylaxis.   Eyelid Cleansers (AVENOVA) 0.01 % SOLN Apply 1 application topically 2 (two) times daily as needed (eyelid cleanser).   levothyroxine (SYNTHROID, LEVOTHROID) 75 MCG tablet Take 75 mcg by mouth daily before breakfast.   omeprazole (PRILOSEC) 20 MG capsule Take 20 mg by mouth daily.   ondansetron (ZOFRAN ODT) 4 MG disintegrating tablet Take 1 tablet (4 mg total) by mouth every 8 (eight) hours as needed for nausea or vomiting.   pregabalin (LYRICA) 150 MG capsule TAKE ONE CAPSULE BY MOUTH TWICE DAILY   propranolol (INDERAL) 60 MG tablet Take 60 mg by mouth daily.   rizatriptan (MAXALT-MLT) 10 MG disintegrating tablet Take 10 mg by mouth as needed for migraine. May repeat in 2 hours if needed   valsartan (DIOVAN) 80 MG tablet Take 80 mg by mouth daily.   No facility-administered encounter medications on file as of 05/16/2021.    Allergies: Allergies  Allergen Reactions   Other Anaphylaxis, Swelling and Other (See Comments)    Walnuts or any nuts   Sulfa Antibiotics Shortness Of Breath and Other (See Comments)    Swelling of lips    Sunscreens Anaphylaxis, Hives and Other (See Comments)    Titanium and zinc oxides Eye swelling    Lamictal [Lamotrigine]     Weird Ticks    Seroquel [Quetiapine] Other (See Comments)    Pychosis (foggy, uncoordinated, slurred speech)   Tape Hives    Family History: Family History  Problem Relation Age of Onset   Depression Mother    Depression Father    Depression Sister    Anxiety disorder Sister    Alcohol abuse Maternal Uncle    Alcohol abuse Paternal Uncle    Alcohol abuse Maternal Grandfather    Depression Maternal Grandmother      Observations/Objective:   No acute distress.  Alert and oriented.  Speech fluent and not dysarthric.  Language intact.     Follow Up Instructions:    -I discussed the assessment and treatment plan with the patient. The patient was provided an opportunity to ask questions and all were answered. The patient agreed with the plan and demonstrated an understanding of the instructions.   The patient was advised to call back or seek an in-person evaluation if the symptoms worsen or if the condition fails to improve as anticipated.  Dudley Major, DO

## 2021-05-16 ENCOUNTER — Encounter: Payer: Self-pay | Admitting: Neurology

## 2021-05-16 ENCOUNTER — Other Ambulatory Visit: Payer: Self-pay

## 2021-05-16 ENCOUNTER — Telehealth (INDEPENDENT_AMBULATORY_CARE_PROVIDER_SITE_OTHER): Payer: No Typology Code available for payment source | Admitting: Neurology

## 2021-05-16 DIAGNOSIS — I671 Cerebral aneurysm, nonruptured: Secondary | ICD-10-CM | POA: Diagnosis not present

## 2021-05-16 DIAGNOSIS — I63412 Cerebral infarction due to embolism of left middle cerebral artery: Secondary | ICD-10-CM | POA: Diagnosis not present

## 2021-05-16 DIAGNOSIS — G500 Trigeminal neuralgia: Secondary | ICD-10-CM | POA: Diagnosis not present

## 2021-05-16 DIAGNOSIS — G2581 Restless legs syndrome: Secondary | ICD-10-CM | POA: Diagnosis not present

## 2021-05-16 MED ORDER — PREGABALIN 150 MG PO CAPS: 150.0000 mg | ORAL_CAPSULE | Freq: Two times a day (BID) | ORAL | 5 refills | Status: DC

## 2021-05-16 NOTE — Patient Instructions (Signed)
Lyrica 150mg  twice daily Discontinue rizatriptan (contraindicated in history of stroke and aneurysm) Follow up 5 months

## 2021-05-21 NOTE — Progress Notes (Signed)
ID#[id number] referral faxed over to the [facility].

## 2021-05-29 ENCOUNTER — Telehealth: Payer: Self-pay | Admitting: Neurology

## 2021-05-29 NOTE — Telephone Encounter (Signed)
She can come by and pick up samples of Ubrelvy 100mg  - take 1 tablet as needed.  May repeat after 2 hours.  Maximum 2 tablets in 24 hours.    Pt advised. Pt to come by to pick up samples on Friday.

## 2021-05-29 NOTE — Telephone Encounter (Signed)
Patient states the last appt with DR Tomi Likens he told her to stop taking the rizatriptan  medication and she needs to know what he would like her to take when she gets a migraine   Please call in the medication to [facility]   Please call patient

## 2021-05-30 DIAGNOSIS — Z7902 Long term (current) use of antithrombotics/antiplatelets: Secondary | ICD-10-CM | POA: Diagnosis not present

## 2021-05-30 DIAGNOSIS — G43909 Migraine, unspecified, not intractable, without status migrainosus: Secondary | ICD-10-CM | POA: Diagnosis not present

## 2021-05-30 DIAGNOSIS — I10 Essential (primary) hypertension: Secondary | ICD-10-CM | POA: Diagnosis not present

## 2021-05-30 DIAGNOSIS — D692 Other nonthrombocytopenic purpura: Secondary | ICD-10-CM | POA: Diagnosis not present

## 2021-06-06 NOTE — Telephone Encounter (Signed)
Medication Samples have been provided to the patient. ? ?Drug name: Hinda Kehr       Strength: 100 mg        Qty: 4  LOT: [id number]  Exp.Date: 10/2021/09/2022 ? ?Dosing instructions: as needed ? ?The patient has been instructed regarding the correct time, dose, and frequency of taking this medication, including desired effects and most common side effects.  ? ?Venetia Night ?3:02 PM ?06/06/2021  ?

## 2021-06-08 DIAGNOSIS — G43909 Migraine, unspecified, not intractable, without status migrainosus: Secondary | ICD-10-CM | POA: Diagnosis not present

## 2021-06-08 DIAGNOSIS — F331 Major depressive disorder, recurrent, moderate: Secondary | ICD-10-CM | POA: Diagnosis not present

## 2021-06-08 DIAGNOSIS — H6981 Other specified disorders of Eustachian tube, right ear: Secondary | ICD-10-CM | POA: Diagnosis not present

## 2021-06-08 DIAGNOSIS — I10 Essential (primary) hypertension: Secondary | ICD-10-CM | POA: Diagnosis not present

## 2021-06-08 DIAGNOSIS — Z299 Encounter for prophylactic measures, unspecified: Secondary | ICD-10-CM | POA: Diagnosis not present

## 2021-06-08 DIAGNOSIS — E039 Hypothyroidism, unspecified: Secondary | ICD-10-CM | POA: Diagnosis not present

## 2021-06-13 DIAGNOSIS — Z299 Encounter for prophylactic measures, unspecified: Secondary | ICD-10-CM | POA: Diagnosis not present

## 2021-06-13 DIAGNOSIS — F3342 Major depressive disorder, recurrent, in full remission: Secondary | ICD-10-CM | POA: Diagnosis not present

## 2021-06-13 DIAGNOSIS — D692 Other nonthrombocytopenic purpura: Secondary | ICD-10-CM | POA: Diagnosis not present

## 2021-06-13 DIAGNOSIS — I10 Essential (primary) hypertension: Secondary | ICD-10-CM | POA: Diagnosis not present

## 2021-06-13 DIAGNOSIS — I77819 Aortic ectasia, unspecified site: Secondary | ICD-10-CM | POA: Diagnosis not present

## 2021-06-17 DIAGNOSIS — I10 Essential (primary) hypertension: Secondary | ICD-10-CM | POA: Diagnosis not present

## 2021-06-17 DIAGNOSIS — Z9049 Acquired absence of other specified parts of digestive tract: Secondary | ICD-10-CM | POA: Diagnosis not present

## 2021-06-17 DIAGNOSIS — M1991 Primary osteoarthritis, unspecified site: Secondary | ICD-10-CM | POA: Diagnosis not present

## 2021-06-17 DIAGNOSIS — M79661 Pain in right lower leg: Secondary | ICD-10-CM | POA: Diagnosis not present

## 2021-06-17 DIAGNOSIS — Z8679 Personal history of other diseases of the circulatory system: Secondary | ICD-10-CM | POA: Diagnosis not present

## 2021-06-17 DIAGNOSIS — Z90710 Acquired absence of both cervix and uterus: Secondary | ICD-10-CM | POA: Diagnosis not present

## 2021-06-17 DIAGNOSIS — M79662 Pain in left lower leg: Secondary | ICD-10-CM | POA: Diagnosis not present

## 2021-06-17 DIAGNOSIS — Z882 Allergy status to sulfonamides status: Secondary | ICD-10-CM | POA: Diagnosis not present

## 2021-06-17 DIAGNOSIS — Z87891 Personal history of nicotine dependence: Secondary | ICD-10-CM | POA: Diagnosis not present

## 2021-06-17 DIAGNOSIS — Z7902 Long term (current) use of antithrombotics/antiplatelets: Secondary | ICD-10-CM | POA: Diagnosis not present

## 2021-06-17 DIAGNOSIS — Z9882 Breast implant status: Secondary | ICD-10-CM | POA: Diagnosis not present

## 2021-06-17 DIAGNOSIS — Z8673 Personal history of transient ischemic attack (TIA), and cerebral infarction without residual deficits: Secondary | ICD-10-CM | POA: Diagnosis not present

## 2021-06-17 DIAGNOSIS — R202 Paresthesia of skin: Secondary | ICD-10-CM | POA: Diagnosis not present

## 2021-06-17 DIAGNOSIS — Z7982 Long term (current) use of aspirin: Secondary | ICD-10-CM | POA: Diagnosis not present

## 2021-06-17 DIAGNOSIS — Z981 Arthrodesis status: Secondary | ICD-10-CM | POA: Diagnosis not present

## 2021-06-17 DIAGNOSIS — I7300 Raynaud's syndrome without gangrene: Secondary | ICD-10-CM | POA: Diagnosis not present

## 2021-06-21 DIAGNOSIS — Z299 Encounter for prophylactic measures, unspecified: Secondary | ICD-10-CM | POA: Diagnosis not present

## 2021-06-21 DIAGNOSIS — I10 Essential (primary) hypertension: Secondary | ICD-10-CM | POA: Diagnosis not present

## 2021-06-21 DIAGNOSIS — I7300 Raynaud's syndrome without gangrene: Secondary | ICD-10-CM | POA: Diagnosis not present

## 2021-06-25 DIAGNOSIS — Z872 Personal history of diseases of the skin and subcutaneous tissue: Secondary | ICD-10-CM | POA: Diagnosis not present

## 2021-06-25 DIAGNOSIS — Z8739 Personal history of other diseases of the musculoskeletal system and connective tissue: Secondary | ICD-10-CM | POA: Diagnosis not present

## 2021-06-25 DIAGNOSIS — Z09 Encounter for follow-up examination after completed treatment for conditions other than malignant neoplasm: Secondary | ICD-10-CM | POA: Diagnosis not present

## 2021-07-04 DIAGNOSIS — M109 Gout, unspecified: Secondary | ICD-10-CM | POA: Diagnosis not present

## 2021-07-04 DIAGNOSIS — Z87891 Personal history of nicotine dependence: Secondary | ICD-10-CM | POA: Diagnosis not present

## 2021-07-04 DIAGNOSIS — Z299 Encounter for prophylactic measures, unspecified: Secondary | ICD-10-CM | POA: Diagnosis not present

## 2021-07-04 DIAGNOSIS — I10 Essential (primary) hypertension: Secondary | ICD-10-CM | POA: Diagnosis not present

## 2021-07-11 ENCOUNTER — Telehealth (HOSPITAL_COMMUNITY): Payer: Self-pay | Admitting: Radiology

## 2021-07-11 DIAGNOSIS — Z789 Other specified health status: Secondary | ICD-10-CM | POA: Diagnosis not present

## 2021-07-11 DIAGNOSIS — K589 Irritable bowel syndrome without diarrhea: Secondary | ICD-10-CM | POA: Diagnosis not present

## 2021-07-11 DIAGNOSIS — R197 Diarrhea, unspecified: Secondary | ICD-10-CM | POA: Diagnosis not present

## 2021-07-11 DIAGNOSIS — I10 Essential (primary) hypertension: Secondary | ICD-10-CM | POA: Diagnosis not present

## 2021-07-11 DIAGNOSIS — Z299 Encounter for prophylactic measures, unspecified: Secondary | ICD-10-CM | POA: Diagnosis not present

## 2021-07-11 NOTE — Telephone Encounter (Signed)
Pt called and left a VM. Called pt back. She states that [facility] continues to bill her regular insurance instead of billing the [place] who is her primary and is responsible for 100% of the bills. I told her I would reach out to [facility] and see if I can get this fixed and call her back with the information. She was appreciative but upset that this has happened again for the third time. Apologized to the patient and thanked her for her service in the [employer]. She is due for f/u in June/July.JM ?

## 2021-08-08 DIAGNOSIS — E039 Hypothyroidism, unspecified: Secondary | ICD-10-CM | POA: Diagnosis not present

## 2021-08-08 DIAGNOSIS — I10 Essential (primary) hypertension: Secondary | ICD-10-CM | POA: Diagnosis not present

## 2021-08-08 DIAGNOSIS — R5383 Other fatigue: Secondary | ICD-10-CM | POA: Diagnosis not present

## 2021-08-08 DIAGNOSIS — Z789 Other specified health status: Secondary | ICD-10-CM | POA: Diagnosis not present

## 2021-08-08 DIAGNOSIS — Z7189 Other specified counseling: Secondary | ICD-10-CM | POA: Diagnosis not present

## 2021-08-08 DIAGNOSIS — Z1339 Encounter for screening examination for other mental health and behavioral disorders: Secondary | ICD-10-CM | POA: Diagnosis not present

## 2021-08-08 DIAGNOSIS — Z1331 Encounter for screening for depression: Secondary | ICD-10-CM | POA: Diagnosis not present

## 2021-08-08 DIAGNOSIS — F339 Major depressive disorder, recurrent, unspecified: Secondary | ICD-10-CM | POA: Diagnosis not present

## 2021-08-08 DIAGNOSIS — Z0000 Encounter for general adult medical examination without abnormal findings: Secondary | ICD-10-CM | POA: Diagnosis not present

## 2021-08-08 DIAGNOSIS — Z299 Encounter for prophylactic measures, unspecified: Secondary | ICD-10-CM | POA: Diagnosis not present

## 2021-09-14 DIAGNOSIS — F419 Anxiety disorder, unspecified: Secondary | ICD-10-CM | POA: Diagnosis not present

## 2021-09-14 DIAGNOSIS — Z299 Encounter for prophylactic measures, unspecified: Secondary | ICD-10-CM | POA: Diagnosis not present

## 2021-09-14 DIAGNOSIS — G4700 Insomnia, unspecified: Secondary | ICD-10-CM | POA: Diagnosis not present

## 2021-09-14 DIAGNOSIS — I10 Essential (primary) hypertension: Secondary | ICD-10-CM | POA: Diagnosis not present

## 2021-09-14 DIAGNOSIS — Z6821 Body mass index (BMI) 21.0-21.9, adult: Secondary | ICD-10-CM | POA: Diagnosis not present

## 2021-09-18 ENCOUNTER — Other Ambulatory Visit (HOSPITAL_COMMUNITY): Payer: Self-pay | Admitting: Interventional Radiology

## 2021-09-18 DIAGNOSIS — I671 Cerebral aneurysm, nonruptured: Secondary | ICD-10-CM

## 2021-09-18 DIAGNOSIS — I639 Cerebral infarction, unspecified: Secondary | ICD-10-CM

## 2021-09-20 DIAGNOSIS — Z7982 Long term (current) use of aspirin: Secondary | ICD-10-CM | POA: Diagnosis not present

## 2021-09-20 DIAGNOSIS — R1013 Epigastric pain: Secondary | ICD-10-CM | POA: Diagnosis not present

## 2021-09-20 DIAGNOSIS — R1012 Left upper quadrant pain: Secondary | ICD-10-CM | POA: Diagnosis not present

## 2021-09-20 DIAGNOSIS — Z9882 Breast implant status: Secondary | ICD-10-CM | POA: Diagnosis not present

## 2021-09-20 DIAGNOSIS — I7123 Aneurysm of the descending thoracic aorta, without rupture: Secondary | ICD-10-CM | POA: Diagnosis not present

## 2021-09-20 DIAGNOSIS — I712 Thoracic aortic aneurysm, without rupture, unspecified: Secondary | ICD-10-CM | POA: Diagnosis not present

## 2021-09-20 DIAGNOSIS — I708 Atherosclerosis of other arteries: Secondary | ICD-10-CM | POA: Diagnosis not present

## 2021-09-20 DIAGNOSIS — R0789 Other chest pain: Secondary | ICD-10-CM | POA: Diagnosis not present

## 2021-09-20 DIAGNOSIS — R079 Chest pain, unspecified: Secondary | ICD-10-CM | POA: Diagnosis not present

## 2021-09-20 DIAGNOSIS — I10 Essential (primary) hypertension: Secondary | ICD-10-CM | POA: Diagnosis not present

## 2021-09-20 DIAGNOSIS — K2900 Acute gastritis without bleeding: Secondary | ICD-10-CM | POA: Diagnosis not present

## 2021-09-20 DIAGNOSIS — Z79899 Other long term (current) drug therapy: Secondary | ICD-10-CM | POA: Diagnosis not present

## 2021-09-20 DIAGNOSIS — E079 Disorder of thyroid, unspecified: Secondary | ICD-10-CM | POA: Diagnosis not present

## 2021-09-20 DIAGNOSIS — I700 Atherosclerosis of aorta: Secondary | ICD-10-CM | POA: Diagnosis not present

## 2021-09-21 DIAGNOSIS — Z299 Encounter for prophylactic measures, unspecified: Secondary | ICD-10-CM | POA: Diagnosis not present

## 2021-09-21 DIAGNOSIS — Z789 Other specified health status: Secondary | ICD-10-CM | POA: Diagnosis not present

## 2021-09-21 DIAGNOSIS — I10 Essential (primary) hypertension: Secondary | ICD-10-CM | POA: Diagnosis not present

## 2021-09-27 DIAGNOSIS — I712 Thoracic aortic aneurysm, without rupture, unspecified: Secondary | ICD-10-CM | POA: Diagnosis not present

## 2021-10-02 DIAGNOSIS — I10 Essential (primary) hypertension: Secondary | ICD-10-CM | POA: Diagnosis not present

## 2021-10-02 DIAGNOSIS — Z299 Encounter for prophylactic measures, unspecified: Secondary | ICD-10-CM | POA: Diagnosis not present

## 2021-10-02 DIAGNOSIS — I719 Aortic aneurysm of unspecified site, without rupture: Secondary | ICD-10-CM | POA: Diagnosis not present

## 2021-10-03 ENCOUNTER — Ambulatory Visit: Payer: Non-veteran care | Admitting: Cardiology

## 2021-10-05 ENCOUNTER — Ambulatory Visit: Payer: Non-veteran care | Admitting: Cardiovascular Disease

## 2021-10-05 DIAGNOSIS — Z299 Encounter for prophylactic measures, unspecified: Secondary | ICD-10-CM | POA: Diagnosis not present

## 2021-10-05 DIAGNOSIS — Z66 Do not resuscitate: Secondary | ICD-10-CM | POA: Diagnosis not present

## 2021-10-05 DIAGNOSIS — I10 Essential (primary) hypertension: Secondary | ICD-10-CM | POA: Diagnosis not present

## 2021-10-17 DIAGNOSIS — R5383 Other fatigue: Secondary | ICD-10-CM | POA: Diagnosis not present

## 2021-10-17 DIAGNOSIS — Z299 Encounter for prophylactic measures, unspecified: Secondary | ICD-10-CM | POA: Diagnosis not present

## 2021-10-17 DIAGNOSIS — I10 Essential (primary) hypertension: Secondary | ICD-10-CM | POA: Diagnosis not present

## 2021-10-19 ENCOUNTER — Telehealth (HOSPITAL_COMMUNITY): Payer: Self-pay | Admitting: Radiology

## 2021-10-22 ENCOUNTER — Ambulatory Visit (HOSPITAL_COMMUNITY)
Admission: RE | Admit: 2021-10-22 | Discharge: 2021-10-22 | Disposition: A | Discharge status: Home / Self Care | Payer: No Typology Code available for payment source | Source: Ambulatory Visit | Attending: Interventional Radiology | Admitting: Interventional Radiology

## 2021-10-22 DIAGNOSIS — I6789 Other cerebrovascular disease: Secondary | ICD-10-CM | POA: Diagnosis not present

## 2021-10-22 DIAGNOSIS — I639 Cerebral infarction, unspecified: Secondary | ICD-10-CM

## 2021-10-22 DIAGNOSIS — I671 Cerebral aneurysm, nonruptured: Secondary | ICD-10-CM | POA: Diagnosis present

## 2021-10-22 DIAGNOSIS — G319 Degenerative disease of nervous system, unspecified: Secondary | ICD-10-CM | POA: Diagnosis not present

## 2021-10-22 MED ORDER — GADOBUTROL 1 MMOL/ML IV SOLN
6.0000 mL | Freq: Once | INTRAVENOUS | Status: AC | PRN
  Administered 2021-10-22: 6 mL via INTRAVENOUS

## 2021-10-24 ENCOUNTER — Telehealth (HOSPITAL_COMMUNITY): Payer: Self-pay | Admitting: Student

## 2021-10-24 DIAGNOSIS — Z789 Other specified health status: Secondary | ICD-10-CM | POA: Diagnosis not present

## 2021-10-24 DIAGNOSIS — M25552 Pain in left hip: Secondary | ICD-10-CM | POA: Diagnosis not present

## 2021-10-24 DIAGNOSIS — Z299 Encounter for prophylactic measures, unspecified: Secondary | ICD-10-CM | POA: Diagnosis not present

## 2021-10-24 DIAGNOSIS — I10 Essential (primary) hypertension: Secondary | ICD-10-CM | POA: Diagnosis not present

## 2021-10-24 NOTE — Telephone Encounter (Signed)
Patient called Teresa Galloway inquiring about her recent MRA results from 10/22/21. Imaging/report reviewed with Dr. Estanislado Pandy who states there are no new findings and everything is as expected. He would like to see her back in 6 months for a routine MRA. I called the patient to share this information with her and she requested for Dr. Estanislado Pandy to call her because she had further questions.  Dr. Estanislado Pandy aware of the patient's request for a phone call from him.   Soyla Dryer, [place] [phone] 10/24/2021, 9:28 AM

## 2021-10-26 ENCOUNTER — Other Ambulatory Visit: Payer: Self-pay

## 2021-11-06 NOTE — Progress Notes (Unsigned)
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 Hypertension Hyperlipidemia Restless leg syndrome Probable left-sided V1 trigeminal neuralgia   1  Secondary stroke prevention: -  ASA '81mg'$  daily and Plavix 37.'5mg'$  every other day as per Dr. Estanislado Pandy - Atorvastatin '40mg'$  daily.  LDL goal less than 70. - Normotensive blood pressure - Hgb A1c goal less than 7 4.  RLS:  Lyrica '150mg'$  BID 5   Trigeminal neuralgia:  Baclofen '5mg'$  BID 6   Follow up 5 months.  Subjective:  Teresa Galloway is a 66 year old female with IBS, migraine, depression, anxiety, Bipolar disorder and OSA   UPDATE: Dizzy nauseous, passed out - There remains discussion what the plan is going forward.   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  History of Lithium toxicity in 2020, presenting with memory deficits/word-finding difficulty, profuse sweating, left arm pain and GI issues.  She stopped Lithium.  However, she continues to have profuse sweating, tingling itch across the face and hands (like a feather running across her skin.  She still has tingling and aching pain radiating down the left arm from shoulder down to the entire hand. No weakness.  No neck pain.  She does have history of cervical  C5-C6-C7 spine fusion 10 years ago.  01/28/2019 NCV-EMG:  normal.  02/19/2019 MRI Cervical Spine Wo:  ACDF C5-6 and C6-7 without stenosis; 2 mm anterolisthesis with central disc protrusion, uncinate spurring and moderate facet degeneration at C4-5 causing mild spinal stenosis and moderate foraminal stenosis bilaterally.  She started seeing a chiropractor which has been helpful.  If she turns her head to the left, she feels the pain and numbness down the left shoulder and to the  fingertips.     Restless Leg Syndrome: She has restless leg syndrome which lasts all day.  12/30/2018 LABS:  Ferritin 65, TSH 1.00, B12 >2,000.    Possible Left-sided V1 Trigeminal Neuralgia and Right-sided Facial Numbness. In 2021, she developed a twitch in her left eye.  She later developed a paroxysmal stabbing pain starting on top left parietal region radiating down to left eye.  It would last a couple of seconds.  It occurs several times a day (6 to 20 times a day).  Some left sided facial numbness.  No associated facial numbness, facial weakness or autonomic symptoms involving the eye.  No triggers.  She subsequently developed right-sided facial numbness.     Stroke: Due to headaches and left facial numbness, she had an MRI and MRA of the brain on 11/25/2020 which revealed mild-moderate chronic small vessel ischemic changes and remote small left cerebellar infarct as well as a 4 x 4 x 2 mm left MCA bifurcation aneurysm.  On 12/13/2020, she underwent endovascular treatment for the aneurysm.  Following the procedure, it was noted that she exhibited right sided weakness, vision loss, some word-finding difficulty with paraphasic errors and slight hesitancy of speech.  No unilateral numbness or weakness.  MRI of brain showed scattered acute infarcts within the left MCA distribution.  MRA of head showed sequelae of interval endovascular treatment of the aneurysm with pipeline flow diverter stent without any hemodynamically significant stenosis.  Echocardiogram showed EF 60-65% but intraatrial septum was not well-visualized.  Lower extremity venous ultrasound negative for DVT.  LDL was 101, Hgb A1c  5.2 and SARS Coronavirus 2 negative.  She was already taking ASA '81mg'$  on day of admission.  She was discharged on ASA '81mg'$  and Plavix '75mg'$  and started on atorvastatin '40mg'$ .  Due to heavy and large bruising/hematomas, Plavix was reduced to 37.'5mg'$  daily while she was in rehab.  She cannot remember much during her 3 days  in the hospital.  She reports visual aura described as sparkles and pinwheels in her vision.  She reports weakness of her right ankle.  She was admitted to [facility] for suspected TIA.  She felt dizzy and massed out.  No lateralizing symptoms.  CTA head and neck revealed postsurgical changes secondary to pipeline stent of MCA bifurcation aneurysm  with unchanged persistent filling of treated aneurysm measuring 4 x 3 mm.  No emergent large vessel occlusion.  MRI of brain showed showed no acute infarct or hemorrhage.  She was observed overnight    She had follow up MRI/MRA of head on 05/02/2021 personally reviewed which still revealed 4 x 3 mm left MCA bifurcation aneurysm, confirmed (3.3-3.6 mm) on cerebral arteriogram on 05/14/2021.  Advised by Dr. Estanislado Pandy to take ASA '81mg'$  daily and Plavix 37.'5mg'$  every other day (P2Y12 level was 86).     Subjective Memory Deficits: She continued to have short-term memory deficits.  12/30/2018 LABS:  TSH 1.00, B12 >2,000.  She underwent neuropsychological testing with Dr. Melvyn Novas on 03/25/2019, which did not demonstrate evidence of neurocognitive deficits.  Symptoms were thought to be possibly related to severe depression and anxiety and pharmacologic effect.      Past medications:  lithium; gabapentin (muscle jerks), ropinrole (lost efficacy), Cymbalta, melatonin (she feels "wired"), indomethacin (side effects, ineffective), pramipexole (discontinued due to augmentation), rizatriptan  PAST MEDICAL HISTORY: Past Medical History:  Diagnosis Date   Acute pericarditis 12/2013   Bipolar II disorder    Possible diagnosis   Complication of anesthesia    Effects lasted for 3 days   Dyslipidemia    Generalized anxiety disorder with panic attacks    Hx of lithium toxicity    Hx of migraines    On propranolol.   Hypertension    Hypothyroid    Major depressive disorder 04/16/2013   Obstructive sleep apnea    Currently untreatred; oral device caused TMJ;  unsuccessful with CPAP   Pericardial effusion 01/03/2014   Moderate without hemodynamic compromise.   Sinus tachycardia     MEDICATIONS: Current Outpatient Medications on File Prior to Visit  Medication Sig Dispense Refill   ALPRAZolam (XANAX) 0.25 MG tablet Take 0.25 mg by mouth 4 (four) times daily as needed for anxiety.      aspirin EC 81 MG tablet Take 81 mg by mouth daily. Swallow whole.     atorvastatin (LIPITOR) 40 MG tablet Take 1 tablet (40 mg total) by mouth daily. 30 tablet 1   Baclofen 5 MG TABS Take 5 mg by mouth 3 (three) times daily. (Patient taking differently: Take 5 mg by mouth in the morning and at bedtime.) 90 tablet 5   carboxymethylcellulose (REFRESH PLUS) 0.5 % SOLN Place 1 drop into both eyes 3 (three) times daily as needed (dry eyes).     clopidogrel (PLAVIX) 75 MG tablet Take 0.5 tablets (37.5 mg total) by mouth daily. 30 tablet 3   dicyclomine (BENTYL) 10 MG capsule Take 1 capsule (10 mg total) by mouth 3 (three) times daily before meals. (Patient taking differently: Take 10 mg by mouth 3 (three) times daily as needed for spasms.)  90 capsule 4   diphenhydrAMINE (BENADRYL) 25 MG tablet Take 25 mg by mouth daily as needed for allergies.     DULoxetine (CYMBALTA) 60 MG capsule Take 60 mg by mouth daily.     EPIPEN 2-PAK 0.3 MG/0.3ML SOAJ injection Inject 0.3 mg into the muscle as needed for anaphylaxis.  1   Eyelid Cleansers (AVENOVA) 0.01 % SOLN Apply 1 application topically 2 (two) times daily as needed (eyelid cleanser).     levothyroxine (SYNTHROID, LEVOTHROID) 75 MCG tablet Take 75 mcg by mouth daily before breakfast.     omeprazole (PRILOSEC) 20 MG capsule Take 20 mg by mouth daily.     ondansetron (ZOFRAN ODT) 4 MG disintegrating tablet Take 1 tablet (4 mg total) by mouth every 8 (eight) hours as needed for nausea or vomiting. 20 tablet 5   pregabalin (LYRICA) 150 MG capsule Take 1 capsule (150 mg total) by mouth 2 (two) times daily. 60 capsule 5   propranolol  (INDERAL) 60 MG tablet Take 60 mg by mouth daily.     valsartan (DIOVAN) 80 MG tablet Take 80 mg by mouth daily.     No current facility-administered medications on file prior to visit.    ALLERGIES: Allergies  Allergen Reactions   Other Anaphylaxis, Swelling and Other (See Comments)    Walnuts or any nuts   Sulfa Antibiotics Shortness Of Breath and Other (See Comments)    Swelling of lips    Sunscreens Anaphylaxis, Hives and Other (See Comments)    Titanium and zinc oxides Eye swelling    Lamictal [Lamotrigine]     Weird Ticks    Seroquel [Quetiapine] Other (See Comments)    Pychosis (foggy, uncoordinated, slurred speech)   Tape Hives    FAMILY HISTORY: Family History  Problem Relation Age of Onset   Depression Mother    Depression Father    Depression Sister    Anxiety disorder Sister    Alcohol abuse Maternal Uncle    Alcohol abuse Paternal Uncle    Alcohol abuse Maternal Grandfather    Depression Maternal Grandmother       Objective:  *** General: No acute distress.  Patient appears ***-groomed.   Head:  Normocephalic/atraumatic Eyes:  Fundi examined but not visualized Neck: supple, no paraspinal tenderness, full range of motion Heart:  Regular rate and rhythm Lungs:  Clear to auscultation bilaterally Back: No paraspinal tenderness Neurological Exam: alert and oriented to person, place, and time.  Speech fluent and not dysarthric, language intact.  CN II-XII intact. Bulk and tone normal, muscle strength 5/5 throughout.  Sensation to light touch intact.  Deep tendon reflexes 2+ throughout, toes downgoing.  Finger to nose testing intact.  Gait normal, Romberg negative.   Metta Clines, DO  CC: ***

## 2021-11-07 ENCOUNTER — Ambulatory Visit (INDEPENDENT_AMBULATORY_CARE_PROVIDER_SITE_OTHER): Payer: No Typology Code available for payment source | Admitting: Neurology

## 2021-11-07 ENCOUNTER — Encounter: Payer: Self-pay | Admitting: Neurology

## 2021-11-07 VITALS — BP 119/67 | HR 51 | Resp 20 | Ht 66.5 in | Wt 128.0 lb

## 2021-11-07 DIAGNOSIS — G2581 Restless legs syndrome: Secondary | ICD-10-CM

## 2021-11-07 DIAGNOSIS — I63412 Cerebral infarction due to embolism of left middle cerebral artery: Secondary | ICD-10-CM

## 2021-11-07 DIAGNOSIS — I671 Cerebral aneurysm, nonruptured: Secondary | ICD-10-CM | POA: Diagnosis not present

## 2021-11-07 MED ORDER — PREGABALIN 150 MG PO CAPS: 150.0000 mg | ORAL_CAPSULE | Freq: Two times a day (BID) | ORAL | 5 refills | Status: DC

## 2021-11-07 MED ORDER — ONDANSETRON 4 MG PO TBDP: 4.0000 mg | ORAL_TABLET | Freq: Three times a day (TID) | ORAL | 5 refills | Status: DC | PRN

## 2021-11-26 DIAGNOSIS — I719 Aortic aneurysm of unspecified site, without rupture: Secondary | ICD-10-CM | POA: Diagnosis not present

## 2021-11-26 DIAGNOSIS — R52 Pain, unspecified: Secondary | ICD-10-CM | POA: Diagnosis not present

## 2021-11-26 DIAGNOSIS — I10 Essential (primary) hypertension: Secondary | ICD-10-CM | POA: Diagnosis not present

## 2021-11-26 DIAGNOSIS — M542 Cervicalgia: Secondary | ICD-10-CM | POA: Diagnosis not present

## 2021-11-26 DIAGNOSIS — Z299 Encounter for prophylactic measures, unspecified: Secondary | ICD-10-CM | POA: Diagnosis not present

## 2021-11-28 DIAGNOSIS — D692 Other nonthrombocytopenic purpura: Secondary | ICD-10-CM | POA: Diagnosis not present

## 2021-11-28 DIAGNOSIS — Z8673 Personal history of transient ischemic attack (TIA), and cerebral infarction without residual deficits: Secondary | ICD-10-CM | POA: Diagnosis not present

## 2021-11-28 DIAGNOSIS — Z7982 Long term (current) use of aspirin: Secondary | ICD-10-CM | POA: Diagnosis not present

## 2021-11-28 DIAGNOSIS — Z7902 Long term (current) use of antithrombotics/antiplatelets: Secondary | ICD-10-CM | POA: Diagnosis not present

## 2021-12-03 DIAGNOSIS — R42 Dizziness and giddiness: Secondary | ICD-10-CM | POA: Diagnosis not present

## 2021-12-03 DIAGNOSIS — R197 Diarrhea, unspecified: Secondary | ICD-10-CM | POA: Diagnosis not present

## 2021-12-03 DIAGNOSIS — R079 Chest pain, unspecified: Secondary | ICD-10-CM | POA: Diagnosis not present

## 2021-12-03 DIAGNOSIS — R531 Weakness: Secondary | ICD-10-CM | POA: Diagnosis not present

## 2021-12-03 DIAGNOSIS — I959 Hypotension, unspecified: Secondary | ICD-10-CM | POA: Diagnosis not present

## 2021-12-05 ENCOUNTER — Other Ambulatory Visit: Payer: Self-pay | Admitting: Physician Assistant

## 2021-12-05 ENCOUNTER — Institutional Professional Consult (permissible substitution) (INDEPENDENT_AMBULATORY_CARE_PROVIDER_SITE_OTHER): Payer: No Typology Code available for payment source | Admitting: Physician Assistant

## 2021-12-05 VITALS — BP 126/70 | HR 78 | Resp 20 | Ht 66.0 in | Wt 126.0 lb

## 2021-12-05 DIAGNOSIS — I7121 Aneurysm of the ascending aorta, without rupture: Secondary | ICD-10-CM

## 2021-12-05 MED ORDER — CLOPIDOGREL BISULFATE 75 MG PO TABS: 37.5000 mg | ORAL_TABLET | Freq: Every day | ORAL | 2 refills | Status: AC

## 2021-12-05 NOTE — Patient Instructions (Signed)
Make every effort to maintain a "heart-healthy" lifestyle with regular physical exercise and adherence to a low-fat, low-carbohydrate diet.  Continue to seek regular follow-up appointments with your primary care physician and/or cardiologist.   AVOID FLOUROQUINOLONES (CIPROFLOXACIN)- this can increase your risk of dissection

## 2021-12-05 NOTE — Progress Notes (Signed)
[address]       Galloway,Teresa [id number]             [id number]        Teresa Galloway [id number] 1956/03/12  History of Present Illness:  Teresa Galloway is a 66 yo female with history of Major Depressive Disorder, Hypothyroidism, Generalized anxiety disorder, Dyslipidemia, IBS, Bipolar Disorder, OSA, H/O Brain Aneurysm and Minimally Enlarged Aorta.  She presents today for evaluation of an Aortic Aneurysm.  The patient states this whole process has been very confusing/frustrating.  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  She was initially referred to Vascular surgery who did not see patient and stated she needed to see Thoracic surgery.  The patient has been seen in the ED several times over the past few months.  Once for complaints of chest pain which ultimately was felt to be gastritis.  She also had issues with dizziness and hypotension.  She denies current chest pain, shortness of breath.  She has experienced a weight loss of 40 lbs due to decreased appetite since her mother has passed away.  She denies history of smoking.  She is compliant with all of her medications.   She has a history of brain aneurysms which have been treated.  Finally she occasionally has palpitations.  Current Outpatient Medications on File Prior to Visit  Medication Sig Dispense Refill   ALPRAZolam (XANAX) 0.25 MG tablet Take 0.25 mg by mouth 4 (four) times daily as needed for anxiety.      aspirin EC 81 MG tablet Take 81 mg by mouth daily. Swallow whole.     atorvastatin (LIPITOR) 40 MG tablet Take 1 tablet (40 mg total) by mouth daily. 30 tablet 1   carboxymethylcellulose (REFRESH PLUS) 0.5 % SOLN Place 1 drop into both eyes 3 (three) times daily as needed (dry eyes).     dicyclomine (BENTYL) 10 MG capsule Take 1 capsule (10 mg total) by mouth 3 (three) times daily before meals. (Patient taking differently: Take 10 mg by mouth 3 (three) times daily as needed for  spasms.) 90 capsule 4   diphenhydrAMINE (BENADRYL) 25 MG tablet Take 25 mg by mouth daily as needed for allergies.     DULoxetine (CYMBALTA) 60 MG capsule Take 60 mg by mouth daily.     EPIPEN 2-PAK 0.3 MG/0.3ML SOAJ injection Inject 0.3 mg into the muscle as needed for anaphylaxis.  1   Eyelid Cleansers (AVENOVA) 0.01 % SOLN Apply 1 application topically 2 (two) times daily as needed (eyelid cleanser).     levothyroxine (SYNTHROID, LEVOTHROID) 75 MCG tablet Take 75 mcg by mouth daily before breakfast.     omeprazole (PRILOSEC) 20 MG capsule Take 20 mg by mouth daily.     ondansetron (ZOFRAN ODT) 4 MG disintegrating tablet Take 1 tablet (4 mg total) by mouth every 8 (eight) hours as needed for nausea or vomiting. 20 tablet 5   pregabalin (LYRICA) 150 MG capsule Take 1 capsule (150 mg total) by mouth 2 (two) times daily. 60 capsule 5   propranolol (INDERAL) 60 MG tablet Take 60 mg by mouth daily.     valsartan (DIOVAN) 80 MG tablet Take 80 mg by mouth daily.     No current facility-administered medications on file prior to visit.     BP 126/70 (BP Location: Left Arm, Patient Position: Sitting)   Pulse 78   Resp 20   Ht 5'  6" (1.676 m)   Wt 126 lb (57.2 kg)   SpO2 98% Comment: Ra  BMI 20.34 kg/m   Physical Exam  Gen: NAD Heart: RRR Lungs: CTA bilaterally Neck: CTA bilaterally Ext: no edema Neuro: grossly intact  Teresa Galloway is a 66 yo female referred for surveillance of aortic aneurysm.  She has had several CT scans over the summer.  These have consistently mentioned descending thoracic aneurysm.  There was also a brief mention of an ascending aortic aneurysm.  These measure at 3.3 cm descending 3.7 ascending.  Either of these aneurysms require repeat surveillance in 1 year.  I think the best option is to obtain a CTA of chest abdomen and pelvis in 1 year.    Patient was educated on importance continued blood pressure control.  She was provided handouts/education on aneurysm,  dissection symptoms.  Risk Modification:  Statin:  Yes  Smoking cessation instruction/counseling given:  No  Patient was counseled on importance of Blood Pressure Control.  Despite Medical intervention if the patient notices persistently elevated blood pressure readings.  They are instructed to contact their Primary Care Physician  Please avoid use of Fluoroquinolones as this can potentially increase your risk of Aortic Rupture and/or Dissection  Patient educated on signs and symptoms of Aortic Dissection, handout also provided in AVS  Susan Bleich, PA-C 12/05/21

## 2021-12-06 ENCOUNTER — Telehealth (HOSPITAL_COMMUNITY): Payer: Self-pay | Admitting: Radiology

## 2021-12-06 NOTE — Telephone Encounter (Signed)
Dr. Estanislado Pandy personally called patient today. The patient did not answer so he left her a voicemail to call either him back or myself. JM

## 2021-12-13 DIAGNOSIS — I10 Essential (primary) hypertension: Secondary | ICD-10-CM | POA: Diagnosis not present

## 2021-12-13 DIAGNOSIS — Z299 Encounter for prophylactic measures, unspecified: Secondary | ICD-10-CM | POA: Diagnosis not present

## 2021-12-13 DIAGNOSIS — L600 Ingrowing nail: Secondary | ICD-10-CM | POA: Diagnosis not present

## 2021-12-13 DIAGNOSIS — I77819 Aortic ectasia, unspecified site: Secondary | ICD-10-CM | POA: Diagnosis not present

## 2021-12-18 DIAGNOSIS — M542 Cervicalgia: Secondary | ICD-10-CM | POA: Diagnosis not present

## 2021-12-18 DIAGNOSIS — T7801XA Anaphylactic reaction due to peanuts, initial encounter: Secondary | ICD-10-CM | POA: Diagnosis not present

## 2021-12-18 DIAGNOSIS — I10 Essential (primary) hypertension: Secondary | ICD-10-CM | POA: Diagnosis not present

## 2021-12-18 DIAGNOSIS — Z299 Encounter for prophylactic measures, unspecified: Secondary | ICD-10-CM | POA: Diagnosis not present

## 2021-12-24 DIAGNOSIS — M542 Cervicalgia: Secondary | ICD-10-CM | POA: Diagnosis not present

## 2021-12-24 DIAGNOSIS — M6281 Muscle weakness (generalized): Secondary | ICD-10-CM | POA: Diagnosis not present

## 2021-12-26 DIAGNOSIS — M6281 Muscle weakness (generalized): Secondary | ICD-10-CM | POA: Diagnosis not present

## 2021-12-26 DIAGNOSIS — M542 Cervicalgia: Secondary | ICD-10-CM | POA: Diagnosis not present

## 2021-12-31 DIAGNOSIS — M542 Cervicalgia: Secondary | ICD-10-CM | POA: Diagnosis not present

## 2021-12-31 DIAGNOSIS — M6281 Muscle weakness (generalized): Secondary | ICD-10-CM | POA: Diagnosis not present

## 2022-01-02 DIAGNOSIS — I10 Essential (primary) hypertension: Secondary | ICD-10-CM | POA: Diagnosis not present

## 2022-01-02 DIAGNOSIS — K529 Noninfective gastroenteritis and colitis, unspecified: Secondary | ICD-10-CM | POA: Diagnosis not present

## 2022-01-02 DIAGNOSIS — M6281 Muscle weakness (generalized): Secondary | ICD-10-CM | POA: Diagnosis not present

## 2022-01-02 DIAGNOSIS — R197 Diarrhea, unspecified: Secondary | ICD-10-CM | POA: Diagnosis not present

## 2022-01-02 DIAGNOSIS — Z299 Encounter for prophylactic measures, unspecified: Secondary | ICD-10-CM | POA: Diagnosis not present

## 2022-01-02 DIAGNOSIS — M542 Cervicalgia: Secondary | ICD-10-CM | POA: Diagnosis not present

## 2022-01-04 DIAGNOSIS — K589 Irritable bowel syndrome without diarrhea: Secondary | ICD-10-CM | POA: Diagnosis not present

## 2022-01-04 DIAGNOSIS — Z299 Encounter for prophylactic measures, unspecified: Secondary | ICD-10-CM | POA: Diagnosis not present

## 2022-01-04 DIAGNOSIS — I10 Essential (primary) hypertension: Secondary | ICD-10-CM | POA: Diagnosis not present

## 2022-01-04 DIAGNOSIS — M19049 Primary osteoarthritis, unspecified hand: Secondary | ICD-10-CM | POA: Diagnosis not present

## 2022-01-07 DIAGNOSIS — M542 Cervicalgia: Secondary | ICD-10-CM | POA: Diagnosis not present

## 2022-01-07 DIAGNOSIS — M6281 Muscle weakness (generalized): Secondary | ICD-10-CM | POA: Diagnosis not present

## 2022-01-14 DIAGNOSIS — M6281 Muscle weakness (generalized): Secondary | ICD-10-CM | POA: Diagnosis not present

## 2022-01-14 DIAGNOSIS — M542 Cervicalgia: Secondary | ICD-10-CM | POA: Diagnosis not present

## 2022-01-15 DIAGNOSIS — J069 Acute upper respiratory infection, unspecified: Secondary | ICD-10-CM | POA: Diagnosis not present

## 2022-01-15 DIAGNOSIS — Z299 Encounter for prophylactic measures, unspecified: Secondary | ICD-10-CM | POA: Diagnosis not present

## 2022-01-15 DIAGNOSIS — R0981 Nasal congestion: Secondary | ICD-10-CM | POA: Diagnosis not present

## 2022-01-17 DIAGNOSIS — M542 Cervicalgia: Secondary | ICD-10-CM | POA: Diagnosis not present

## 2022-01-17 DIAGNOSIS — M6281 Muscle weakness (generalized): Secondary | ICD-10-CM | POA: Diagnosis not present

## 2022-01-28 DIAGNOSIS — H524 Presbyopia: Secondary | ICD-10-CM | POA: Diagnosis not present

## 2022-01-28 DIAGNOSIS — H40013 Open angle with borderline findings, low risk, bilateral: Secondary | ICD-10-CM | POA: Diagnosis not present

## 2022-02-11 ENCOUNTER — Ambulatory Visit (INDEPENDENT_AMBULATORY_CARE_PROVIDER_SITE_OTHER): Payer: No Typology Code available for payment source | Admitting: Internal Medicine

## 2022-02-11 ENCOUNTER — Encounter: Payer: Self-pay | Admitting: Internal Medicine

## 2022-02-11 VITALS — BP 126/62 | HR 74 | Temp 98.1°F | Resp 18 | Ht 66.5 in | Wt 127.4 lb

## 2022-02-11 DIAGNOSIS — T7805XA Anaphylactic reaction due to tree nuts and seeds, initial encounter: Secondary | ICD-10-CM

## 2022-02-11 DIAGNOSIS — J310 Chronic rhinitis: Secondary | ICD-10-CM | POA: Diagnosis not present

## 2022-02-11 DIAGNOSIS — H1013 Acute atopic conjunctivitis, bilateral: Secondary | ICD-10-CM | POA: Diagnosis not present

## 2022-02-11 MED ORDER — AZELASTINE HCL 0.1 % NA SOLN: 1.0000 | Freq: Two times a day (BID) | NASAL | 3 refills | Status: DC

## 2022-02-11 MED ORDER — EPIPEN 2-PAK 0.3 MG/0.3ML IJ SOAJ: 0.3000 mg | INTRAMUSCULAR | 1 refills | Status: DC | PRN

## 2022-02-11 MED ORDER — FLUTICASONE PROPIONATE 50 MCG/ACT NA SUSP: 2.0000 | Freq: Every day | NASAL | 3 refills | Status: DC

## 2022-02-11 NOTE — Progress Notes (Signed)
NEW PATIENT  Date of Service/Encounter:  02/11/22  Consult requested by: Garey Ham, NP   Subjective:   Teresa Galloway (DOB: 05-17-1955) is a 66 y.o. female who presents to the clinic on 02/11/2022 with a chief complaint of Allergic Reaction, Allergy Testing, and Nasal Congestion .    History obtained from: chart review and patient. PMH significant for aortic aneurysm and  brain aneurysm.  Rhinitis:  Started around age 37 years Symptoms include: nasal congestion, rhinorrhea, post nasal drainage, sneezing, watery eyes, and itchy eyes ear aches Occurs year-round Potential triggers: unsure  Treatments tried:  Zyrtec/Claritin/Allegra with minimal relief; last use was Thursday Flonase PRN  Previous allergy testing: yes; can't recall results and it was years ago History of chronic sinusitis or sinus surgery: yes for deviated septum and small sinus openings   Concern for Food Allergy:  Foods of concern: walnut, pecan  History of reaction:  Initially around 66 years of age, she would have throat itching with eating walnuts.  She kept eating them sometimes but then had a severe reaction with hives, facial swelling around age 62s. She had to go the ER at the time. The most recent reaction was due to accidental exposure in 2007 while she was on her honeymoon- ate walnut in cookies by mistake.  She had lip/tongue/eye swelling and then had hives and also felt very anxious. Also had trouble breathing and raspy voice.  She did not have her Epipen and had to call the ambulance.    About 2 years ago, ate pecan and had throat itching and had lip bumps. Took 3 benadryl and did fine.    About 1 month ago, she ate banana split ice cream with walnuts by mistake.  She did not have a reaction.    Avoids all treenuts and coconut now.   Previous allergy testing no Carries an epinephrine autoinjector: yes  Past Medical History: Past Medical History:  Diagnosis Date   Acute pericarditis  12/2013   Angio-edema    Bipolar II disorder    Possible diagnosis   Complication of anesthesia    Effects lasted for 3 days   Dyslipidemia    Generalized anxiety disorder with panic attacks    Hx of lithium toxicity    Hx of migraines    On propranolol.   Hypertension    Hypothyroid    Major depressive disorder 04/16/2013   Obstructive sleep apnea    Currently untreatred; oral device caused TMJ; unsuccessful with CPAP   Pericardial effusion 01/03/2014   Moderate without hemodynamic compromise.   Sinus tachycardia    Urticaria     Past Surgical History: Past Surgical History:  Procedure Laterality Date   BREAST SURGERY     CERVICAL FUSION     CHOLECYSTECTOMY     COLONOSCOPY WITH PROPOFOL N/A 01/16/2018   Procedure: COLONOSCOPY WITH PROPOFOL;  Surgeon: Rogene Houston, MD;  [facility]: [facility];  Service: Endoscopy;  Laterality: N/A;  10:55   IR 3D INDEPENDENT WKST  12/13/2020   IR ANGIO INTRA EXTRACRAN SEL INTERNAL CAROTID BILAT MOD SED  12/13/2020   IR ANGIO VERTEBRAL SEL VERTEBRAL UNI L MOD SED  12/13/2020   IR CT HEAD LTD  12/13/2020   IR RADIOLOGIST EVAL & MGMT  11/30/2020   IR RADIOLOGIST EVAL & MGMT  01/10/2021   IR RADIOLOGIST EVAL & MGMT  05/14/2021   IR TRANSCATH/EMBOLIZ  12/13/2020   NASAL SINUS SURGERY     POLYPECTOMY  01/16/2018  Procedure: POLYPECTOMY;  Surgeon: Rogene Houston, MD;  Location: [facility];  Service: Endoscopy;;  colon   RADIOLOGY WITH ANESTHESIA N/A 12/13/2020   Procedure: IR WITH ANESTHESIA EBOLIZATION;  Surgeon: Luanne Bras, MD;  Location: [place];  Service: Radiology;  Laterality: N/A;   VAGINAL HYSTERECTOMY      Family History: Family History  Problem Relation Age of Onset   Depression Mother    Depression Father    Depression Sister    Anxiety disorder Sister    Alcohol abuse Maternal Uncle    Alcohol abuse Paternal Uncle    Alcohol abuse Maternal Grandfather    Depression Maternal Grandmother     Social History:  Lives in a  15 year apartment Flooring in bedroom: carpet Pets: cat Tobacco use/exposure: prior; 14 yrs of 1ppd, quit in 2014 Job: retired  Medication List:  Allergies as of 02/11/2022       Reactions   Other Anaphylaxis, Swelling, Other (See Comments)   Walnuts or any nuts   Sulfa Antibiotics Shortness Of Breath, Other (See Comments)   Swelling of lips    Sunscreens Anaphylaxis, Hives, Other (See Comments)   Titanium and zinc oxides Eye swelling    Lamictal [lamotrigine]    Weird Ticks    Seroquel [quetiapine] Other (See Comments)   Pychosis (foggy, uncoordinated, slurred speech)   Tape Hives        Medication List        Accurate as of February 11, 2022  3:59 PM. If you have any questions, ask your nurse or doctor.          STOP taking these medications    ondansetron 4 MG disintegrating tablet Commonly known as: Zofran ODT Stopped by: Larose Kells, MD       TAKE these medications    ALPRAZolam 0.25 MG tablet Commonly known as: XANAX Take 0.25 mg by mouth 4 (four) times daily as needed for anxiety.   aspirin EC 81 MG tablet Take 81 mg by mouth daily. Swallow whole.   atorvastatin 40 MG tablet Commonly known as: Lipitor Take 1 tablet (40 mg total) by mouth daily.   Avenova 0.01 % Soln Apply 1 application topically 2 (two) times daily as needed (eyelid cleanser).   carboxymethylcellulose 0.5 % Soln Commonly known as: REFRESH PLUS Place 1 drop into both eyes 3 (three) times daily as needed (dry eyes).   clopidogrel 75 MG tablet Commonly known as: PLAVIX Take 0.5 tablets (37.5 mg total) by mouth daily.   dicyclomine 10 MG capsule Commonly known as: BENTYL Take 1 capsule (10 mg total) by mouth 3 (three) times daily before meals. What changed:  when to take this reasons to take this   diphenhydrAMINE 25 MG tablet Commonly known as: BENADRYL Take 25 mg by mouth daily as needed for allergies.   DULoxetine 60 MG capsule Commonly known as: CYMBALTA Take 60  mg by mouth daily.   EpiPen 2-Pak 0.3 mg/0.3 mL Soaj injection Generic drug: EPINEPHrine Inject 0.3 mg into the muscle as needed for anaphylaxis.   levothyroxine 75 MCG tablet Commonly known as: SYNTHROID Take 75 mcg by mouth daily before breakfast.   omeprazole 20 MG capsule Commonly known as: PRILOSEC Take 20 mg by mouth daily.   pregabalin 150 MG capsule Commonly known as: LYRICA Take 1 capsule (150 mg total) by mouth 2 (two) times daily.   propranolol 60 MG tablet Commonly known as: INDERAL Take 60 mg by mouth daily.   traZODone  100 MG tablet Commonly known as: DESYREL Take 100 mg by mouth at bedtime as needed for sleep.   valsartan 80 MG tablet Commonly known as: DIOVAN Take 80 mg by mouth daily.         REVIEW OF SYSTEMS: Pertinent positives and negatives discussed in HPI.   Objective:   Physical Exam: BP 126/62 (BP Location: Left Arm, Patient Position: Sitting, Cuff Size: Normal)   Pulse 74   Temp 98.1 F (36.7 C) (Temporal)   Resp 18   Ht 5' 6.5" (1.689 m)   Wt 127 lb 6.4 oz (57.8 kg)   SpO2 96%   BMI 20.25 kg/m  Body mass index is 20.25 kg/m. GEN: alert, well developed HEENT: clear conjunctiva, TM grey and translucent, nose with + inferior turbinate hypertrophy, pale nasal mucosa, slight clear rhinorrhea, + cobblestoning HEART: regular rate and rhythm, no murmur LUNGS: clear to auscultation bilaterally, no coughing, unlabored respiration ABDOMEN: soft, non distended  SKIN: no rashes or lesions  Reviewed:  No records  Skin Testing:  Skin prick testing was placed, which includes aeroallergens/foods, histamine control, and saline control.  Verbal consent was obtained prior to placing test.  We discussed risks including anaphylaxis. Patient tolerated procedure well.  Allergy testing results were read and interpreted by myself, documented by clinical staff. Adequate positive and negative control.  Results discussed with patient/family.  Airborne  Adult Perc - 02/11/22 1430     Time Antigen Placed 1430    Allergen Manufacturer Lavella Hammock    Location Back    Number of Test 59    1. Control-Buffer 50% Glycerol Negative    2. Control-Histamine 1 mg/ml 3+    3. Albumin saline Negative    4. Leslie Negative    5. [place] Negative    6. Johnson Negative    7. Sherwood Blue Negative    8. Meadow Fescue Negative    9. Perennial Rye Negative    10. Sweet Vernal Negative    11. Timothy Negative    12. Cocklebur Negative    13. Burweed Marshelder Negative    14. Ragweed, short Negative    15. Ragweed, Giant Negative    16. Plantain,  English Negative    17. Lamb's Quarters Negative    18. Sheep Sorrell Negative    19. Rough Pigweed Negative    20. Marsh Elder, Rough Negative    21. Mugwort, Common Negative    22. Ash mix Negative    23. Birch mix Negative    24. Beech American Negative    25. Box, Elder Negative    26. Cedar, red Negative    27. Cottonwood, [place] Negative    28. Elm mix Negative    29. Hickory Negative    30. Maple mix Negative    31. Oak, [place] mix Negative    32. Pecan Pollen Negative    33. Pine mix Negative    34. Sycamore Eastern Negative    35. [place], Black Pollen Negative    36. Alternaria alternata Negative    37. Cladosporium Herbarum Negative    38. Aspergillus mix Negative    39. Penicillium mix Negative    40. Bipolaris sorokiniana (Helminthosporium) Negative    41. Drechslera spicifera (Curvularia) Negative    42. Mucor plumbeus Negative    43. Fusarium moniliforme Negative    44. Aureobasidium pullulans (pullulara) Negative    45. Rhizopus oryzae Negative    46. Botrytis cinera Negative    47. Epicoccum nigrum  Negative    48. Phoma betae Negative    49. Candida Albicans Negative    50. Trichophyton mentagrophytes Negative    51. Mite, D Farinae  5,000 AU/ml Negative    52. Mite, D Pteronyssinus  5,000 AU/ml Negative    53. Cat Hair 10,000 BAU/ml Negative    54.  Dog Epithelia  Negative    55. Mixed Feathers Negative    56. Horse Epithelia Negative    57. Cockroach, German Negative    58. Mouse Negative    59. Tobacco Leaf Negative             Intradermal - 02/11/22 1512     Time Antigen Placed 1512    Allergen Manufacturer Lavella Hammock    Location Arm    Number of Test 15    Control Negative    Gu[place] Negative    Johnson Negative    7 Grass Negative    Ragweed mix Negative    Weed mix Negative    Tree mix Negative    Mold 1 Negative    Mold 2 Negative    Mold 3 Negative    Mold 4 Negative    Cat Negative    Dog Negative    Cockroach Negative    Mite mix Negative             Food Adult Perc - 02/11/22 1400     Time Antigen Placed 1431    Allergen Manufacturer Lavella Hammock    Location Back    Number of allergen test 8     Control-buffer 50% Glycerol Negative    Control-Histamine 1 mg/ml 3+    10. Cashew Negative    11. Pecan Food Negative    12. Mertens Negative    13. Almond Negative    14. Hazelnut Negative    15. [place] nut Negative    16. Coconut --   3x3   17. Pistachio --   3x2              Assessment:   1. Anaphylaxis due to tree nut, initial encounter   2. Allergic conjunctivitis of both eyes   3. Chronic rhinitis     Plan/Recommendations:  Food allergy:  - please strictly avoid treenuts. Slight SPT reactivity to pistachio and coconut.  Please stop by lab to get treenut and coconut panel done.   - for SKIN only reaction, okay to take Benadryl '25mg'$  capsules every 6 hours - for SKIN + ANY additional symptoms, OR IF concern for LIFE THREATENING reaction = Epipen Autoinjector EpiPen 0.3 mg. - If using Epinephrine autoinjector, call 911 or go to the emergency room.  Chronic Rhinitis: - Positive skin test to none 02/2022 - Avoidance measures discussed. - Use nasal saline rinses before nose sprays such as with Neilmed Sinus Rinse.  Use distilled water.   - Use Flonase 2 sprays each nostril daily. Aim upward and  outward. - Use Azelastine 1-2 sprays each nostril twice daily as needed. Aim upward and outward.  Return in about 3 months (around 05/14/2022).       Return in about 3 months (around 05/14/2022).  Harlon Flor, MD [facility]

## 2022-02-11 NOTE — Patient Instructions (Addendum)
Food allergy:  - please strictly avoid treenuts.  Please stop by lab to get treenut and coconut panel done.   - for SKIN only reaction, okay to take Benadryl '25mg'$  capsules every 6 hours - for SKIN + ANY additional symptoms, OR IF concern for LIFE THREATENING reaction = Epipen Autoinjector EpiPen 0.3 mg. - If using Epinephrine autoinjector, call 911 or go to the emergency room.  Rhinitis: - Positive skin test to none 02/2022 - Avoidance measures discussed. - Use nasal saline rinses before nose sprays such as with Neilmed Sinus Rinse.  Use distilled water.   - Use Flonase 2 sprays each nostril daily. Aim upward and outward. - Use Azelastine 1-2 sprays each nostril twice daily as needed. Aim upward and outward.  Return in about 3 months (around 05/14/2022).

## 2022-02-14 DIAGNOSIS — Z299 Encounter for prophylactic measures, unspecified: Secondary | ICD-10-CM | POA: Diagnosis not present

## 2022-02-14 DIAGNOSIS — H6993 Unspecified Eustachian tube disorder, bilateral: Secondary | ICD-10-CM | POA: Diagnosis not present

## 2022-02-14 DIAGNOSIS — H6121 Impacted cerumen, right ear: Secondary | ICD-10-CM | POA: Diagnosis not present

## 2022-02-14 LAB — ALLERGEN COCONUT IGE: Allergen Coconut IgE: <0.1 kU/L

## 2022-02-14 LAB — ALLERGENS(7)
Brazil Nut IgE: <0.1 kU/L
F020-IgE Almond: <0.1 kU/L
F202-IgE Cashew Nut: <0.1 kU/L
Hazelnut (Filbert) IgE: <0.1 kU/L
Peanut IgE: <0.1 kU/L
Pecan Nut IgE: <0.1 kU/L
Walnut IgE: <0.1 kU/L

## 2022-02-18 DIAGNOSIS — R42 Dizziness and giddiness: Secondary | ICD-10-CM | POA: Diagnosis not present

## 2022-02-18 DIAGNOSIS — I10 Essential (primary) hypertension: Secondary | ICD-10-CM | POA: Diagnosis not present

## 2022-02-18 DIAGNOSIS — Z299 Encounter for prophylactic measures, unspecified: Secondary | ICD-10-CM | POA: Diagnosis not present

## 2022-02-27 DIAGNOSIS — I10 Essential (primary) hypertension: Secondary | ICD-10-CM | POA: Diagnosis not present

## 2022-02-27 DIAGNOSIS — Z87891 Personal history of nicotine dependence: Secondary | ICD-10-CM | POA: Diagnosis not present

## 2022-02-27 DIAGNOSIS — E441 Mild protein-calorie malnutrition: Secondary | ICD-10-CM | POA: Diagnosis not present

## 2022-02-27 DIAGNOSIS — Z0000 Encounter for general adult medical examination without abnormal findings: Secondary | ICD-10-CM | POA: Diagnosis not present

## 2022-02-27 DIAGNOSIS — Z299 Encounter for prophylactic measures, unspecified: Secondary | ICD-10-CM | POA: Diagnosis not present

## 2022-02-27 DIAGNOSIS — R5383 Other fatigue: Secondary | ICD-10-CM | POA: Diagnosis not present

## 2022-02-27 DIAGNOSIS — E039 Hypothyroidism, unspecified: Secondary | ICD-10-CM | POA: Diagnosis not present

## 2022-02-27 DIAGNOSIS — Z6820 Body mass index (BMI) 20.0-20.9, adult: Secondary | ICD-10-CM | POA: Diagnosis not present

## 2022-03-01 DIAGNOSIS — R5383 Other fatigue: Secondary | ICD-10-CM | POA: Diagnosis not present

## 2022-03-01 DIAGNOSIS — E039 Hypothyroidism, unspecified: Secondary | ICD-10-CM | POA: Diagnosis not present

## 2022-03-01 DIAGNOSIS — E7800 Pure hypercholesterolemia, unspecified: Secondary | ICD-10-CM | POA: Diagnosis not present

## 2022-03-01 DIAGNOSIS — Z79899 Other long term (current) drug therapy: Secondary | ICD-10-CM | POA: Diagnosis not present

## 2022-03-07 DIAGNOSIS — H524 Presbyopia: Secondary | ICD-10-CM | POA: Diagnosis not present

## 2022-03-25 DIAGNOSIS — I10 Essential (primary) hypertension: Secondary | ICD-10-CM | POA: Diagnosis not present

## 2022-03-25 DIAGNOSIS — R52 Pain, unspecified: Secondary | ICD-10-CM | POA: Diagnosis not present

## 2022-03-25 DIAGNOSIS — Z299 Encounter for prophylactic measures, unspecified: Secondary | ICD-10-CM | POA: Diagnosis not present

## 2022-03-25 DIAGNOSIS — M10031 Idiopathic gout, right wrist: Secondary | ICD-10-CM | POA: Diagnosis not present

## 2022-04-05 DIAGNOSIS — R197 Diarrhea, unspecified: Secondary | ICD-10-CM | POA: Diagnosis not present

## 2022-04-05 DIAGNOSIS — R5383 Other fatigue: Secondary | ICD-10-CM | POA: Diagnosis not present

## 2022-04-05 DIAGNOSIS — Z299 Encounter for prophylactic measures, unspecified: Secondary | ICD-10-CM | POA: Diagnosis not present

## 2022-04-15 ENCOUNTER — Other Ambulatory Visit (HOSPITAL_COMMUNITY): Payer: Self-pay | Admitting: Interventional Radiology

## 2022-04-15 DIAGNOSIS — I671 Cerebral aneurysm, nonruptured: Secondary | ICD-10-CM

## 2022-04-16 ENCOUNTER — Other Ambulatory Visit (HOSPITAL_COMMUNITY): Payer: Self-pay | Admitting: Interventional Radiology

## 2022-04-16 DIAGNOSIS — I671 Cerebral aneurysm, nonruptured: Secondary | ICD-10-CM

## 2022-04-25 ENCOUNTER — Ambulatory Visit (HOSPITAL_COMMUNITY): Payer: PPO

## 2022-04-25 ENCOUNTER — Telehealth (HOSPITAL_COMMUNITY): Payer: Self-pay

## 2022-04-25 ENCOUNTER — Ambulatory Visit (HOSPITAL_COMMUNITY)
Admission: RE | Admit: 2022-04-25 | Discharge: 2022-04-25 | Disposition: A | Discharge status: Home / Self Care | Payer: No Typology Code available for payment source | Source: Ambulatory Visit | Attending: Interventional Radiology | Admitting: Interventional Radiology

## 2022-04-25 ENCOUNTER — Ambulatory Visit (HOSPITAL_COMMUNITY)
Admission: RE | Admit: 2022-04-25 | Discharge: 2022-04-25 | Disposition: A | Discharge status: Home / Self Care | Payer: PPO | Source: Ambulatory Visit | Attending: Interventional Radiology | Admitting: Interventional Radiology

## 2022-04-25 DIAGNOSIS — Z8679 Personal history of other diseases of the circulatory system: Secondary | ICD-10-CM | POA: Diagnosis not present

## 2022-04-25 DIAGNOSIS — I671 Cerebral aneurysm, nonruptured: Secondary | ICD-10-CM | POA: Diagnosis present

## 2022-04-25 NOTE — Telephone Encounter (Signed)
Called pt to inform her that we will need to reschedule her mri bc of auth and her consult bc Dr. Estanislado Pandy has an emergency. No answer, left vm. AW

## 2022-04-26 ENCOUNTER — Ambulatory Visit (HOSPITAL_COMMUNITY)
Admission: RE | Admit: 2022-04-26 | Discharge: 2022-04-26 | Disposition: A | Discharge status: Home / Self Care | Payer: PPO | Source: Ambulatory Visit | Attending: Interventional Radiology | Admitting: Interventional Radiology

## 2022-04-26 DIAGNOSIS — Z9889 Other specified postprocedural states: Secondary | ICD-10-CM | POA: Diagnosis not present

## 2022-04-26 DIAGNOSIS — I671 Cerebral aneurysm, nonruptured: Secondary | ICD-10-CM | POA: Diagnosis not present

## 2022-05-01 HISTORY — PX: IR RADIOLOGIST EVAL & MGMT: IMG5224

## 2022-05-09 NOTE — Progress Notes (Deleted)
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 Hypertension Hyperlipidemia Restless leg syndrome    1  Secondary stroke prevention: -  ASA '81mg'$  dailyDeveshwar - Atorvastatin '40mg'$  daily.  LDL goal less than 70. - Normotensive blood pressure - Hgb A1c goal less than 7 4.  RLS:  Lyrica '150mg'$  BID 5   Follow up 6 months.  Subjective:  Teresa Galloway is a 67 year old female with IBS, thoracic aortic aneurysm, migraine, depression, anxiety, Bipolar disorder and OSA   UPDATE: Taking Lyrica '150mg'$  BID for RLS.  ***  Repeat MRA of head on 04/25/2022 personally reviewed was stable with patent stent within the distal left MCA and no residual aneurysm.  She was advised by Dr. Estanislado Pandy to discontinue Plavix but to continue ASA '81mg'$  daily.  Plan for follow-up MRA in one year.     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  History of Lithium toxicity in 2020, presenting with memory deficits/word-finding difficulty, profuse sweating, left arm pain and GI issues.  She stopped Lithium.  However, she continues to have profuse sweating, tingling itch across the face and hands (like a feather running across her skin.  She still has tingling and aching pain radiating down the left arm from shoulder down to the entire hand. No weakness.  No neck pain.  She does have history of cervical  C5-C6-C7 spine fusion 10 years ago.  01/28/2019 NCV-EMG:  normal.  02/19/2019 MRI Cervical Spine Wo:  ACDF C5-6 and C6-7 without stenosis; 2 mm anterolisthesis with central disc protrusion, uncinate spurring and moderate facet degeneration at C4-5 causing mild spinal stenosis and moderate foraminal stenosis bilaterally.  She started seeing a chiropractor which has been helpful.   If she turns her head to the left, she feels the pain and numbness down the left shoulder and to the fingertips.     Restless Leg Syndrome: She has restless leg syndrome which lasts all day.  12/30/2018 LABS:  Ferritin 65, TSH 1.00, B12 >2,000.    Possible Left-sided V1 Trigeminal Neuralgia and Right-sided Facial Numbness. In 2021, she developed a twitch in her left eye.  She later developed a paroxysmal stabbing pain starting on top left parietal region radiating down to left eye.  It would last a couple of seconds.  It occurs several times a day (6 to 20 times a day).  Some left sided facial numbness.  No associated facial numbness, facial weakness or autonomic symptoms involving the eye.  No triggers.  She subsequently developed right-sided facial numbness.     Stroke: Due to headaches and left facial numbness, she had an MRI and MRA of the brain on 11/25/2020 which revealed mild-moderate chronic small vessel ischemic changes and remote small left cerebellar infarct as well as a 4 x 4 x 2 mm left MCA bifurcation aneurysm.  On 12/13/2020, she underwent endovascular treatment for the aneurysm.  Following the procedure, it was noted that she exhibited right sided weakness, vision loss, some word-finding difficulty with paraphasic errors and slight hesitancy of speech.  No unilateral numbness or weakness.  MRI of brain showed scattered acute infarcts within the left MCA distribution.  MRA of head showed sequelae of interval endovascular treatment of the aneurysm with pipeline flow diverter stent without any hemodynamically significant stenosis.  Echocardiogram  showed EF 60-65% but intraatrial septum was not well-visualized.  Lower extremity venous ultrasound negative for DVT.  LDL was 101, Hgb A1c 5.2 and SARS Coronavirus 2 negative.  She was already taking ASA '81mg'$  on day of admission.  She was discharged on ASA '81mg'$  and Plavix '75mg'$  and started on atorvastatin '40mg'$ .  Due to heavy and large bruising/hematomas,  Plavix was reduced to 37.'5mg'$  daily while she was in rehab.  She cannot remember much during her 3 days in the hospital.  She reports visual aura described as sparkles and pinwheels in her vision.  She reports weakness of her right ankle.  She was admitted to [facility] for suspected TIA.  She felt dizzy and massed out.  No lateralizing symptoms.  CTA head and neck revealed postsurgical changes secondary to pipeline stent of MCA bifurcation aneurysm  with unchanged persistent filling of treated aneurysm measuring 4 x 3 mm.  No emergent large vessel occlusion.  MRI of brain showed showed no acute infarct or hemorrhage.  She was observed overnight    She had follow up MRI/MRA of head on 05/02/2021 personally reviewed which still revealed 4 x 3 mm left MCA bifurcation aneurysm, confirmed (3.3-3.6 mm) on cerebral arteriogram on 05/14/2021.  Advised by Dr. Estanislado Pandy to take ASA '81mg'$  daily and Plavix 37.'5mg'$  every other day (P2Y12 level was 86).  Repeat MRI/MRA of brain on 10/22/2021 was stable with no acute findings and no flow related signal within the treated left MCA aneurysm.     Subjective Memory Deficits: She continued to have short-term memory deficits.  12/30/2018 LABS:  TSH 1.00, B12 >2,000.  She underwent neuropsychological testing with Dr. Melvyn Novas on 03/25/2019, which did not demonstrate evidence of neurocognitive deficits.  Symptoms were thought to be possibly related to severe depression and anxiety and pharmacologic effect.      Past medications:  lithium; gabapentin (muscle jerks), ropinrole (lost efficacy), Cymbalta, melatonin (she feels "wired"), indomethacin (side effects, ineffective), pramipexole (discontinued due to augmentation), rizatriptan, baclofen  PAST MEDICAL HISTORY: Past Medical History:  Diagnosis Date   Acute pericarditis 12/2013   Angio-edema    Bipolar II disorder    Possible diagnosis   Complication of anesthesia    Effects lasted for 3 days   Dyslipidemia     Generalized anxiety disorder with panic attacks    Hx of lithium toxicity    Hx of migraines    On propranolol.   Hypertension    Hypothyroid    Major depressive disorder 04/16/2013   Obstructive sleep apnea    Currently untreatred; oral device caused TMJ; unsuccessful with CPAP   Pericardial effusion 01/03/2014   Moderate without hemodynamic compromise.   Sinus tachycardia    Urticaria     MEDICATIONS: Current Outpatient Medications on File Prior to Visit  Medication Sig Dispense Refill   ALPRAZolam (XANAX) 0.25 MG tablet Take 0.25 mg by mouth 4 (four) times daily as needed for anxiety.      aspirin EC 81 MG tablet Take 81 mg by mouth daily. Swallow whole.     atorvastatin (LIPITOR) 40 MG tablet Take 1 tablet (40 mg total) by mouth daily. 30 tablet 1   azelastine (ASTELIN) 0.1 % nasal spray Place 1 spray into both nostrils 2 (two) times daily. Use in each nostril as directed 30 mL 3   carboxymethylcellulose (REFRESH PLUS) 0.5 % SOLN Place 1 drop into both eyes 3 (three) times daily as needed (dry eyes).     dicyclomine (BENTYL) 10  MG capsule Take 1 capsule (10 mg total) by mouth 3 (three) times daily before meals. (Patient taking differently: Take 10 mg by mouth 3 (three) times daily as needed for spasms.) 90 capsule 4   diphenhydrAMINE (BENADRYL) 25 MG tablet Take 25 mg by mouth daily as needed for allergies.     DULoxetine (CYMBALTA) 60 MG capsule Take 60 mg by mouth daily.     EPIPEN 2-PAK 0.3 MG/0.3ML SOAJ injection Inject 0.3 mg into the muscle as needed for anaphylaxis. 2 each 1   Eyelid Cleansers (AVENOVA) 0.01 % SOLN Apply 1 application topically 2 (two) times daily as needed (eyelid cleanser).     fluticasone (FLONASE) 50 MCG/ACT nasal spray Place 2 sprays into both nostrils daily. 16 g 3   levothyroxine (SYNTHROID, LEVOTHROID) 75 MCG tablet Take 75 mcg by mouth daily before breakfast.     omeprazole (PRILOSEC) 20 MG capsule Take 20 mg by mouth daily.     pregabalin (LYRICA)  150 MG capsule Take 1 capsule (150 mg total) by mouth 2 (two) times daily. 60 capsule 5   propranolol (INDERAL) 60 MG tablet Take 60 mg by mouth daily.     traZODone (DESYREL) 100 MG tablet Take 100 mg by mouth at bedtime as needed for sleep.     valsartan (DIOVAN) 80 MG tablet Take 80 mg by mouth daily.     No current facility-administered medications on file prior to visit.    ALLERGIES: Allergies  Allergen Reactions   Other Anaphylaxis, Swelling and Other (See Comments)    Walnuts or any nuts   Sulfa Antibiotics Shortness Of Breath and Other (See Comments)    Swelling of lips    Sunscreens Anaphylaxis, Hives and Other (See Comments)    Titanium and zinc oxides Eye swelling    Lamictal [Lamotrigine]     Weird Ticks    Seroquel [Quetiapine] Other (See Comments)    Pychosis (foggy, uncoordinated, slurred speech)   Tape Hives    FAMILY HISTORY: Family History  Problem Relation Age of Onset   Depression Mother    Depression Father    Depression Sister    Anxiety disorder Sister    Alcohol abuse Maternal Uncle    Alcohol abuse Paternal Uncle    Alcohol abuse Maternal Grandfather    Depression Maternal Grandmother       Objective:  *** General: No acute distress.  Patient appears well-groomed.   Head:  Normocephalic/atraumatic Eyes:  Fundi examined but not visualized Neck: supple, no paraspinal tenderness, full range of motion Heart:  Regular rate and rhythm Neurological Exam: alert and oriented to person, place, and time.  Speech fluent and not dysarthric, language intact.  CN II-XII intact. Bulk and tone normal, muscle strength 5/5 throughout.  Sensation to light touch intact.  Deep tendon reflexes 2+ throughout.  Finger to nose testing intact.  Gait normal, Romberg negative.   Metta Clines, DO  CC: Jerene Bears, MD

## 2022-05-10 ENCOUNTER — Ambulatory Visit: Payer: PPO | Admitting: Neurology

## 2022-05-14 ENCOUNTER — Telehealth: Payer: Self-pay

## 2022-05-14 ENCOUNTER — Other Ambulatory Visit: Payer: Self-pay | Admitting: Neurology

## 2022-05-14 NOTE — Telephone Encounter (Signed)
New referral sent to [facility], Per Rep last referral deactivated. Sent Urgent, Appt rescheduled.

## 2022-05-14 NOTE — Progress Notes (Deleted)
 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 Hypertension Hyperlipidemia Restless leg syndrome    1  Secondary stroke prevention: -  ASA 81mg dailyDeveshwar - Atorvastatin 40mg daily.  LDL goal less than 70. - Normotensive blood pressure - Hgb A1c goal less than 7 4.  RLS:  Lyrica 150mg BID 5   Follow up 6 months.  Subjective:  Teresa Galloway is a 67 year old female with IBS, thoracic aortic aneurysm, migraine, depression, anxiety, Bipolar disorder and OSA   UPDATE: Taking Lyrica 150mg BID for RLS.  ***  Repeat MRA of head on 04/25/2022 personally reviewed was stable with patent stent within the distal left MCA and no residual aneurysm.  She was advised by Dr. Deveshwar to discontinue Plavix but to continue ASA 81mg daily.  Plan for follow-up MRA in one year.     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  History of Lithium toxicity in 2020, presenting with memory deficits/word-finding difficulty, profuse sweating, left arm pain and GI issues.  She stopped Lithium.  However, she continues to have profuse sweating, tingling itch across the face and hands (like a feather running across her skin.  She still has tingling and aching pain radiating down the left arm from shoulder down to the entire hand. No weakness.  No neck pain.  She does have history of cervical  C5-C6-C7 spine fusion 10 years ago.  01/28/2019 NCV-EMG:  normal.  02/19/2019 MRI Cervical Spine Wo:  ACDF C5-6 and C6-7 without stenosis; 2 mm anterolisthesis with central disc protrusion, uncinate spurring and moderate facet degeneration at C4-5 causing mild spinal stenosis and moderate foraminal stenosis bilaterally.  She started seeing a chiropractor which has been helpful.   If she turns her head to the left, she feels the pain and numbness down the left shoulder and to the fingertips.     Restless Leg Syndrome: She has restless leg syndrome which lasts all day.  12/30/2018 LABS:  Ferritin 65, TSH 1.00, B12 >2,000.    Possible Left-sided V1 Trigeminal Neuralgia and Right-sided Facial Numbness. In 2021, she developed a twitch in her left eye.  She later developed a paroxysmal stabbing pain starting on top left parietal region radiating down to left eye.  It would last a couple of seconds.  It occurs several times a day (6 to 20 times a day).  Some left sided facial numbness.  No associated facial numbness, facial weakness or autonomic symptoms involving the eye.  No triggers.  She subsequently developed right-sided facial numbness.     Stroke: Due to headaches and left facial numbness, she had an MRI and MRA of the brain on 11/25/2020 which revealed mild-moderate chronic small vessel ischemic changes and remote small left cerebellar infarct as well as a 4 x 4 x 2 mm left MCA bifurcation aneurysm.  On 12/13/2020, she underwent endovascular treatment for the aneurysm.  Following the procedure, it was noted that she exhibited right sided weakness, vision loss, some word-finding difficulty with paraphasic errors and slight hesitancy of speech.  No unilateral numbness or weakness.  MRI of brain showed scattered acute infarcts within the left MCA distribution.  MRA of head showed sequelae of interval endovascular treatment of the aneurysm with pipeline flow diverter stent without any hemodynamically significant stenosis.  Echocardiogram   showed EF 60-65% but intraatrial septum was not well-visualized.  Lower extremity venous ultrasound negative for DVT.  LDL was 101, Hgb A1c 5.2 and SARS Coronavirus 2 negative.  She was already taking ASA 81mg on day of admission.  She was discharged on ASA 81mg and Plavix 75mg and started on atorvastatin 40mg.  Due to heavy and large bruising/hematomas,  Plavix was reduced to 37.5mg daily while she was in rehab.  She cannot remember much during her 3 days in the hospital.  She reports visual aura described as sparkles and pinwheels in her vision.  She reports weakness of her right ankle.  She was admitted to [facility] for suspected TIA.  She felt dizzy and massed out.  No lateralizing symptoms.  CTA head and neck revealed postsurgical changes secondary to pipeline stent of MCA bifurcation aneurysm  with unchanged persistent filling of treated aneurysm measuring 4 x 3 mm.  No emergent large vessel occlusion.  MRI of brain showed showed no acute infarct or hemorrhage.  She was observed overnight    She had follow up MRI/MRA of head on 05/02/2021 personally reviewed which still revealed 4 x 3 mm left MCA bifurcation aneurysm, confirmed (3.3-3.6 mm) on cerebral arteriogram on 05/14/2021.  Advised by Dr. Deveshwar to take ASA 81mg daily and Plavix 37.5mg every other day (P2Y12 level was 86).  Repeat MRI/MRA of brain on 10/22/2021 was stable with no acute findings and no flow related signal within the treated left MCA aneurysm.     Subjective Memory Deficits: She continued to have short-term memory deficits.  12/30/2018 LABS:  TSH 1.00, B12 >2,000.  She underwent neuropsychological testing with Dr. Merz on 03/25/2019, which did not demonstrate evidence of neurocognitive deficits.  Symptoms were thought to be possibly related to severe depression and anxiety and pharmacologic effect.      Past medications:  lithium; gabapentin (muscle jerks), ropinrole (lost efficacy), Cymbalta, melatonin (she feels "wired"), indomethacin (side effects, ineffective), pramipexole (discontinued due to augmentation), rizatriptan, baclofen  PAST MEDICAL HISTORY: Past Medical History:  Diagnosis Date   Acute pericarditis 12/2013   Angio-edema    Bipolar II disorder    Possible diagnosis   Complication of anesthesia    Effects lasted for 3 days   Dyslipidemia     Generalized anxiety disorder with panic attacks    Hx of lithium toxicity    Hx of migraines    On propranolol.   Hypertension    Hypothyroid    Major depressive disorder 04/16/2013   Obstructive sleep apnea    Currently untreatred; oral device caused TMJ; unsuccessful with CPAP   Pericardial effusion 01/03/2014   Moderate without hemodynamic compromise.   Sinus tachycardia    Urticaria     MEDICATIONS: Current Outpatient Medications on File Prior to Visit  Medication Sig Dispense Refill   ALPRAZolam (XANAX) 0.25 MG tablet Take 0.25 mg by mouth 4 (four) times daily as needed for anxiety.      aspirin EC 81 MG tablet Take 81 mg by mouth daily. Swallow whole.     atorvastatin (LIPITOR) 40 MG tablet Take 1 tablet (40 mg total) by mouth daily. 30 tablet 1   azelastine (ASTELIN) 0.1 % nasal spray Place 1 spray into both nostrils 2 (two) times daily. Use in each nostril as directed 30 mL 3   carboxymethylcellulose (REFRESH PLUS) 0.5 % SOLN Place 1 drop into both eyes 3 (three) times daily as needed (dry eyes).     dicyclomine (BENTYL) 10   MG capsule Take 1 capsule (10 mg total) by mouth 3 (three) times daily before meals. (Patient taking differently: Take 10 mg by mouth 3 (three) times daily as needed for spasms.) 90 capsule 4   diphenhydrAMINE (BENADRYL) 25 MG tablet Take 25 mg by mouth daily as needed for allergies.     DULoxetine (CYMBALTA) 60 MG capsule Take 60 mg by mouth daily.     EPIPEN 2-PAK 0.3 MG/0.3ML SOAJ injection Inject 0.3 mg into the muscle as needed for anaphylaxis. 2 each 1   Eyelid Cleansers (AVENOVA) 0.01 % SOLN Apply 1 application topically 2 (two) times daily as needed (eyelid cleanser).     fluticasone (FLONASE) 50 MCG/ACT nasal spray Place 2 sprays into both nostrils daily. 16 g 3   levothyroxine (SYNTHROID, LEVOTHROID) 75 MCG tablet Take 75 mcg by mouth daily before breakfast.     omeprazole (PRILOSEC) 20 MG capsule Take 20 mg by mouth daily.     pregabalin (LYRICA)  150 MG capsule Take 1 capsule (150 mg total) by mouth 2 (two) times daily. 60 capsule 5   propranolol (INDERAL) 60 MG tablet Take 60 mg by mouth daily.     traZODone (DESYREL) 100 MG tablet Take 100 mg by mouth at bedtime as needed for sleep.     valsartan (DIOVAN) 80 MG tablet Take 80 mg by mouth daily.     No current facility-administered medications on file prior to visit.    ALLERGIES: Allergies  Allergen Reactions   Other Anaphylaxis, Swelling and Other (See Comments)    Walnuts or any nuts   Sulfa Antibiotics Shortness Of Breath and Other (See Comments)    Swelling of lips    Sunscreens Anaphylaxis, Hives and Other (See Comments)    Titanium and zinc oxides Eye swelling    Lamictal [Lamotrigine]     Weird Ticks    Seroquel [Quetiapine] Other (See Comments)    Pychosis (foggy, uncoordinated, slurred speech)   Tape Hives    FAMILY HISTORY: Family History  Problem Relation Age of Onset   Depression Mother    Depression Father    Depression Sister    Anxiety disorder Sister    Alcohol abuse Maternal Uncle    Alcohol abuse Paternal Uncle    Alcohol abuse Maternal Grandfather    Depression Maternal Grandmother       Objective:  *** General: No acute distress.  Patient appears well-groomed.   Head:  Normocephalic/atraumatic Eyes:  Fundi examined but not visualized Neck: supple, no paraspinal tenderness, full range of motion Heart:  Regular rate and rhythm Neurological Exam: alert and oriented to person, place, and time.  Speech fluent and not dysarthric, language intact.  CN II-XII intact. Bulk and tone normal, muscle strength 5/5 throughout.  Sensation to light touch intact.  Deep tendon reflexes 2+ throughout.  Finger to nose testing intact.  Gait normal, Romberg negative.   Margareta Laureano, DO  CC: Dhruv Vyas, MD      

## 2022-05-15 ENCOUNTER — Ambulatory Visit: Payer: PPO | Admitting: Neurology

## 2022-05-20 ENCOUNTER — Encounter: Payer: Self-pay | Admitting: Internal Medicine

## 2022-05-20 ENCOUNTER — Ambulatory Visit (INDEPENDENT_AMBULATORY_CARE_PROVIDER_SITE_OTHER): Payer: No Typology Code available for payment source | Admitting: Internal Medicine

## 2022-05-20 ENCOUNTER — Other Ambulatory Visit: Payer: Self-pay

## 2022-05-20 VITALS — BP 110/62 | HR 100 | Temp 97.6°F | Resp 14 | Ht 66.5 in | Wt 127.8 lb

## 2022-05-20 DIAGNOSIS — T7805XD Anaphylactic reaction due to tree nuts and seeds, subsequent encounter: Secondary | ICD-10-CM

## 2022-05-20 DIAGNOSIS — J310 Chronic rhinitis: Secondary | ICD-10-CM | POA: Diagnosis not present

## 2022-05-20 MED ORDER — EPIPEN 2-PAK 0.3 MG/0.3ML IJ SOAJ: 0.3000 mg | INTRAMUSCULAR | 1 refills | Status: DC | PRN

## 2022-05-20 MED ORDER — FLUTICASONE PROPIONATE 50 MCG/ACT NA SUSP: 2.0000 | Freq: Every day | NASAL | 5 refills | Status: DC

## 2022-05-20 MED ORDER — AZELASTINE HCL 0.1 % NA SOLN: 1.0000 | Freq: Two times a day (BID) | NASAL | 5 refills | Status: DC

## 2022-05-20 NOTE — Progress Notes (Signed)
FOLLOW UP Date of Service/Encounter:  05/20/22   Subjective:  Teresa Galloway (DOB: October 13, 1955) is a 67 y.o. female who returns to the [facility] on 05/20/2022 for follow up for food allergy and chronic rhinitis.   History obtained from: chart review and patient. Last visit was with me 11/6/20223 where she had slight SPT positivity to pistachio and coconut. sIgE were negative for all treenuts and coconut. Also SPT aeroallergen was negative, started on Flonase/Azelastine PRN.    Food Allergies: She had questions regarding oral challenge and is interested in reintroduction of walnuts since they are good for her health. She isn't sure if she wants to reintroduce any other nuts or coconut though.  No accidental exposures since last visit.  No Epipen use since last visit either.   Rhinitis: Reports still having intermittent congestion and drainage and runny nose.  She did recently get a dog and has not noticed any changes in symptoms.  She is taking Flonase 2 SEN daily but stopped Azelastine for a short time per another physician while she was taking another medication. Denies ocular symptoms.   Past Medical History: Past Medical History:  Diagnosis Date   Acute pericarditis 12/2013   Angio-edema    Bipolar II disorder    Possible diagnosis   Complication of anesthesia    Effects lasted for 3 days   Dyslipidemia    Generalized anxiety disorder with panic attacks    Hx of lithium toxicity    Hx of migraines    On propranolol.   Hypertension    Hypothyroid    Major depressive disorder 04/16/2013   Obstructive sleep apnea    Currently untreatred; oral device caused TMJ; unsuccessful with CPAP   Pericardial effusion 01/03/2014   Moderate without hemodynamic compromise.   Sinus tachycardia    Urticaria     Objective:  BP 110/62   Pulse 100   Temp 97.6 F (36.4 C) (Temporal)   Resp 14   Ht 5' 6.5" (1.689 m)   Wt 127 lb 12.8 oz (58 kg)   SpO2 98%   BMI 20.32  kg/m  Body mass index is 20.32 kg/m. Physical Exam: GEN: alert, well developed HEENT: clear conjunctiva, TM grey and translucent, nose with moderate inferior turbinate hypertrophy, pink nasal mucosa, clear rhinorrhea, + cobblestoning HEART: regular rate and rhythm, no murmur LUNGS: clear to auscultation bilaterally, no coughing, unlabored respiration SKIN: no rashes or lesions   Assessment:   No diagnosis found.  Plan/Recommendations:  Food allergy:  - Initial rxn: pecan caused lip bumps and throat itching. Walnuts caused hives, facial swelling and also some trouble breathing.   - 02/2022 with slight reactivity to pistachio but other treenuts and coconut were negative. sIgE to treenuts and coconut all negative 02/2022.  Recommend oral challenge to walnuts that she is interested in eating; she does not have any interested in eating the other nuts at this time.  Please hold all anti histamines 3 days prior to this.  Please bring at least 20 walnut pieces.   - for SKIN only reaction, okay to take Benadryl 67m capsules every 6 hours - for SKIN + ANY additional symptoms, OR IF concern for LIFE THREATENING reaction = Epipen Autoinjector EpiPen 0.3 mg. - If using Epinephrine autoinjector, call 911 or go to the emergency room.  Chronic Rhinitis: - Positive skin test to none 02/2022 - Avoidance measures discussed. - Use nasal saline rinses before nose sprays such as with Neilmed Sinus Rinse.  Use distilled water.   - Use Flonase 2 sprays each nostril daily. Aim upward and outward. - Use Azelastine 1-2 sprays each nostril twice daily as needed. Aim upward and outward.  Follow up: whenever available for oral challenge walnuts.   Harlon Flor, MD [facility]

## 2022-05-20 NOTE — Patient Instructions (Addendum)
Food allergy:  - skin test was negative to nuts and so was blood test.  Recommend oral challenge to walnuts that she is interested in eating.  Please hold all anti histamines 3 days prior to this.  Please bring at least 20 walnut pieces.   - for SKIN only reaction, okay to take Benadryl 51m capsules every 6 hours - for SKIN + ANY additional symptoms, OR IF concern for LIFE THREATENING reaction = Epipen Autoinjector EpiPen 0.3 mg. - If using Epinephrine autoinjector, call 911 or go to the emergency room.  Chronic Rhinitis: - Positive skin test to none 02/2022 - Avoidance measures discussed. - Use nasal saline rinses before nose sprays such as with Neilmed Sinus Rinse.  Use distilled water.   - Use Flonase 2 sprays each nostril daily. Aim upward and outward. - Use Azelastine 1-2 sprays each nostril twice daily as needed. Aim upward and outward.  Follow up: whenever available for oral challenge walnuts.

## 2022-05-31 ENCOUNTER — Ambulatory Visit: Payer: PPO | Admitting: Neurology

## 2022-06-05 ENCOUNTER — Telehealth: Payer: Self-pay | Admitting: Internal Medicine

## 2022-06-05 NOTE — Telephone Encounter (Addendum)
I called patient to remind her of challenge appointment and she wants to talk with Dr. Allena Katz before saying she would be here for the walnut challenge .  She wants to talk with Dr. Allena Katz [phone]

## 2022-06-06 DIAGNOSIS — R079 Chest pain, unspecified: Secondary | ICD-10-CM | POA: Diagnosis not present

## 2022-06-06 DIAGNOSIS — I712 Thoracic aortic aneurysm, without rupture, unspecified: Secondary | ICD-10-CM | POA: Diagnosis not present

## 2022-06-06 DIAGNOSIS — R911 Solitary pulmonary nodule: Secondary | ICD-10-CM | POA: Diagnosis not present

## 2022-06-06 DIAGNOSIS — I7123 Aneurysm of the descending thoracic aorta, without rupture: Secondary | ICD-10-CM | POA: Diagnosis not present

## 2022-06-06 NOTE — Telephone Encounter (Signed)
Patient called again today and needs to speak to dr patel by Friday about her appointment. Patient states she will only speak to Dr. Posey Pronto & declined to speak to clinical staff.

## 2022-06-07 ENCOUNTER — Telehealth: Payer: Self-pay | Admitting: Internal Medicine

## 2022-06-07 NOTE — Telephone Encounter (Signed)
Patient questions regarding her upcoming food challenge: she asked if she were to have a reaction would she need epinephrine answered with yes as that is the treatment for anaphylaxis.  She is worried about getting epinephrine in setting of her aortic aneurysm as in the past her blood pressure had gotten severely elevated with use of epi.  Informed her that at this time the best thing would be to continue avoidance of all nuts and not doing the food challenge and keeping epinephrine if she were to ever need it for severe anaphylactic reaction.  She voiced understanding and thanked me for calling.  She will follow-up with PCP who can refill her Epipen and nose sprays and does not need to follow-up with [place]. We will go ahead and cancel the food challenge appointment.

## 2022-06-10 ENCOUNTER — Encounter: Payer: No Typology Code available for payment source | Admitting: Internal Medicine

## 2022-06-10 DIAGNOSIS — R748 Abnormal levels of other serum enzymes: Secondary | ICD-10-CM | POA: Diagnosis not present

## 2022-06-10 DIAGNOSIS — R918 Other nonspecific abnormal finding of lung field: Secondary | ICD-10-CM | POA: Diagnosis not present

## 2022-06-10 DIAGNOSIS — Z299 Encounter for prophylactic measures, unspecified: Secondary | ICD-10-CM | POA: Diagnosis not present

## 2022-06-10 DIAGNOSIS — R1013 Epigastric pain: Secondary | ICD-10-CM | POA: Diagnosis not present

## 2022-06-10 DIAGNOSIS — I10 Essential (primary) hypertension: Secondary | ICD-10-CM | POA: Diagnosis not present

## 2022-06-13 NOTE — Telephone Encounter (Signed)
Dr. Posey Pronto spoke with patient regarding this matter. Please see telephone encounter for 06/07/2022.

## 2022-06-14 ENCOUNTER — Telehealth (INDEPENDENT_AMBULATORY_CARE_PROVIDER_SITE_OTHER): Payer: PPO | Admitting: Neurology

## 2022-06-14 ENCOUNTER — Encounter: Payer: Self-pay | Admitting: Neurology

## 2022-06-14 DIAGNOSIS — G2581 Restless legs syndrome: Secondary | ICD-10-CM

## 2022-06-14 DIAGNOSIS — I671 Cerebral aneurysm, nonruptured: Secondary | ICD-10-CM

## 2022-06-14 DIAGNOSIS — I63412 Cerebral infarction due to embolism of left middle cerebral artery: Secondary | ICD-10-CM

## 2022-06-14 NOTE — Progress Notes (Signed)
Virtual Visit via Video Note  Consent was obtained for video visit:  Yes.   Answered questions that patient had about telehealth interaction:  Yes.   I discussed the limitations, risks, security and privacy concerns of performing an evaluation and management service by telemedicine. I also discussed with the patient that there may be a patient responsible charge related to this service. The patient expressed understanding and agreed to proceed.  Pt location: Home Physician Location: office Name of referring provider:  Glenda Chroman, MD I connected with Newton Pigg at patients initiation/request on 06/14/2022 at 12:50 PM EST by video enabled telemedicine application and verified that I am speaking with the correct person using two identifiers. 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 Hypertension Hyperlipidemia Restless leg syndrome    1  Secondary stroke prevention: -  ASA '81mg'$  daily - Atorvastatin '40mg'$  daily.  LDL goal less than 70. - Normotensive blood pressure - Hgb A1c goal less than 7 4.  RLS:  Lyrica '75mg'$  BID for now.  If symptoms worsen, we can increase dose back to '150mg'$  BID once LFTs normalize 5   Follow up 6 months.  Subjective:  Teresa Galloway is a 67 year old female with IBS, thoracic aortic aneurysm, migraine, depression, anxiety, Bipolar disorder and OSA   UPDATE: Repeat MRA of head on 04/25/2022 personally reviewed was stable with patent stent within the distal left MCA and no residual aneurysm.  She was advised by Dr. Estanislado Pandy to discontinue Plavix but to continue ASA '81mg'$  daily.  Plan for follow-up MRA in one year.    She had been experiencing abdominal pain radiating into the chest.  She was seen in the ED at [facility].  CTA of aorta revealed stable descending thoracic aortic aneurysm but no evidence of  abdominal aortic aneurysm or dissection.  She was found to have elevated LFTs.  Unclear if it is reactive from recent COVID or from starting risperidone.  It has been trending down.  Her PCP halved her Lyrica to '75mg'$  BID in meantime. So far no worsening in RLS.      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  History of Lithium toxicity in 2020, presenting with memory deficits/word-finding difficulty, profuse sweating, left arm pain and GI issues.  She stopped Lithium.  However, she continues to have profuse sweating, tingling itch across the face and hands (like a feather running across her skin.  She still has tingling and aching pain radiating down the left arm from shoulder down to the entire hand. No weakness.  No neck pain.  She does have history of cervical  C5-C6-C7 spine fusion 10 years ago.  01/28/2019 NCV-EMG:  normal.  02/19/2019 MRI Cervical Spine Wo:  ACDF C5-6 and C6-7 without stenosis; 2 mm anterolisthesis with central disc protrusion, uncinate spurring and moderate facet degeneration at C4-5 causing mild spinal stenosis and moderate foraminal stenosis bilaterally.  She started seeing a chiropractor which has been helpful.  If she turns her head to the left, she feels the pain and numbness down the left shoulder and to the fingertips.     Restless Leg Syndrome: She has restless leg syndrome which lasts all day.  12/30/2018 LABS:  Ferritin 65, TSH 1.00, B12 >2,000.  Possible Left-sided V1 Trigeminal Neuralgia and Right-sided Facial Numbness. In 2021, she developed a twitch in her left eye.  She later developed a paroxysmal stabbing pain starting on top left parietal region radiating down to left eye.  It would last a couple of seconds.  It occurs several times a day (6 to 20 times a day).  Some left sided facial numbness.  No associated facial numbness, facial weakness or  autonomic symptoms involving the eye.  No triggers.  She subsequently developed right-sided facial numbness.     Stroke: Due to headaches and left facial numbness, she had an MRI and MRA of the brain on 11/25/2020 which revealed mild-moderate chronic small vessel ischemic changes and remote small left cerebellar infarct as well as a 4 x 4 x 2 mm left MCA bifurcation aneurysm.  On 12/13/2020, she underwent endovascular treatment for the aneurysm.  Following the procedure, it was noted that she exhibited right sided weakness, vision loss, some word-finding difficulty with paraphasic errors and slight hesitancy of speech.  No unilateral numbness or weakness.  MRI of brain showed scattered acute infarcts within the left MCA distribution.  MRA of head showed sequelae of interval endovascular treatment of the aneurysm with pipeline flow diverter stent without any hemodynamically significant stenosis.  Echocardiogram showed EF 60-65% but intraatrial septum was not well-visualized.  Lower extremity venous ultrasound negative for DVT.  LDL was 101, Hgb A1c 5.2 and SARS Coronavirus 2 negative.  She was already taking ASA '81mg'$  on day of admission.  She was discharged on ASA '81mg'$  and Plavix '75mg'$  and started on atorvastatin '40mg'$ .  Due to heavy and large bruising/hematomas, Plavix was reduced to 37.'5mg'$  daily while she was in rehab.  She cannot remember much during her 3 days in the hospital.  She reports visual aura described as sparkles and pinwheels in her vision.  She reports weakness of her right ankle.  She was admitted to [facility] for suspected TIA.  She felt dizzy and massed out.  No lateralizing symptoms.  CTA head and neck revealed postsurgical changes secondary to pipeline stent of MCA bifurcation aneurysm  with unchanged persistent filling of treated aneurysm measuring 4 x 3 mm.  No emergent large vessel occlusion.  MRI of brain showed showed no acute infarct or hemorrhage.  She was observed overnight     She had follow up MRI/MRA of head on 05/02/2021 personally reviewed which still revealed 4 x 3 mm left MCA bifurcation aneurysm, confirmed (3.3-3.6 mm) on cerebral arteriogram on 05/14/2021.  Advised by Dr. Estanislado Pandy to take ASA '81mg'$  daily and Plavix 37.'5mg'$  every other day (P2Y12 level was 86).  Repeat MRI/MRA of brain on 10/22/2021 was stable with no acute findings and no flow related signal within the treated left MCA aneurysm.     Subjective Memory Deficits: She continued to have short-term memory deficits.  12/30/2018 LABS:  TSH 1.00, B12 >2,000.  She underwent neuropsychological testing with Dr. Melvyn Novas on 03/25/2019, which did not demonstrate evidence of neurocognitive deficits.  Symptoms were thought to be possibly related to severe depression and anxiety and pharmacologic effect.      Past medications:  lithium; gabapentin (muscle jerks), ropinrole (lost efficacy), Cymbalta, melatonin (she feels "wired"), indomethacin (side effects, ineffective), pramipexole (discontinued due to augmentation), rizatriptan, baclofen  Past Medical History: Past Medical History:  Diagnosis Date   Acute pericarditis 12/2013   Angio-edema    Bipolar II disorder    Possible diagnosis   Complication of anesthesia  Effects lasted for 3 days   Dyslipidemia    Generalized anxiety disorder with panic attacks    Hx of lithium toxicity    Hx of migraines    On propranolol.   Hypertension    Hypothyroid    Major depressive disorder 04/16/2013   Obstructive sleep apnea    Currently untreatred; oral device caused TMJ; unsuccessful with CPAP   Pericardial effusion 01/03/2014   Moderate without hemodynamic compromise.   Sinus tachycardia    Urticaria     Medications: Outpatient Encounter Medications as of 06/14/2022  Medication Sig   ALPRAZolam (XANAX) 0.25 MG tablet Take 0.25 mg by mouth 4 (four) times daily as needed for anxiety.    aspirin EC 81 MG tablet Take 81 mg by mouth daily. Swallow whole.    atorvastatin (LIPITOR) 40 MG tablet Take 1 tablet (40 mg total) by mouth daily.   azelastine (ASTELIN) 0.1 % nasal spray Place 1 spray into both nostrils 2 (two) times daily. Use in each nostril as directed   carboxymethylcellulose (REFRESH PLUS) 0.5 % SOLN Place 1 drop into both eyes 3 (three) times daily as needed (dry eyes).   dicyclomine (BENTYL) 10 MG capsule Take 1 capsule (10 mg total) by mouth 3 (three) times daily before meals. (Patient taking differently: Take 10 mg by mouth 3 (three) times daily as needed for spasms.)   DULoxetine (CYMBALTA) 60 MG capsule Take 60 mg by mouth daily.   EPIPEN 2-PAK 0.3 MG/0.3ML SOAJ injection Inject 0.3 mg into the muscle as needed for anaphylaxis.   Eyelid Cleansers (AVENOVA) 0.01 % SOLN Apply 1 application topically 2 (two) times daily as needed (eyelid cleanser).   fluticasone (FLONASE) 50 MCG/ACT nasal spray Place 2 sprays into both nostrils daily.   levothyroxine (SYNTHROID, LEVOTHROID) 75 MCG tablet Take 75 mcg by mouth daily before breakfast.   omeprazole (PRILOSEC) 20 MG capsule Take 20 mg by mouth daily.   pregabalin (LYRICA) 150 MG capsule TAKE ONE CAPSULE BY MOUTH TWICE DAILY   propranolol (INDERAL) 60 MG tablet Take 60 mg by mouth daily.   risperiDONE (RISPERDAL) 2 MG tablet Take 2 mg by mouth at bedtime.   tiZANidine (ZANAFLEX) 4 MG tablet Take 4 mg by mouth every 8 (eight) hours as needed for muscle spasms.   traZODone (DESYREL) 100 MG tablet Take 100 mg by mouth at bedtime as needed for sleep.   valsartan (DIOVAN) 80 MG tablet Take 80 mg by mouth daily.   No facility-administered encounter medications on file as of 06/14/2022.    Allergies: Allergies  Allergen Reactions   Other Anaphylaxis, Swelling and Other (See Comments)    Walnuts or any nuts   Sulfa Antibiotics Shortness Of Breath and Other (See Comments)    Swelling of lips    Sunscreens Anaphylaxis, Hives and Other (See Comments)    Titanium and zinc oxides Eye swelling     Lamictal [Lamotrigine]     Weird Ticks    Seroquel [Quetiapine] Other (See Comments)    Pychosis (foggy, uncoordinated, slurred speech)   Tape Hives    Family History: Family History  Problem Relation Age of Onset   Depression Mother    Depression Father    Depression Sister    Anxiety disorder Sister    Alcohol abuse Maternal Uncle    Alcohol abuse Paternal Uncle    Alcohol abuse Maternal Grandfather    Depression Maternal Grandmother     Observations/Objective:   No acute distress.  Alert and oriented.  Speech fluent and not dysarthric.  Language intact.     Follow Up Instructions:    -I discussed the assessment and treatment plan with the patient. The patient was provided an opportunity to ask questions and all were answered. The patient agreed with the plan and demonstrated an understanding of the instructions.   The patient was advised to call back or seek an in-person evaluation if the symptoms worsen or if the condition fails to improve as anticipated.   Dudley Major, DO  CC: Jerene Bears, MD

## 2022-06-20 DIAGNOSIS — R7401 Elevation of levels of liver transaminase levels: Secondary | ICD-10-CM | POA: Diagnosis not present

## 2022-06-20 DIAGNOSIS — R748 Abnormal levels of other serum enzymes: Secondary | ICD-10-CM | POA: Diagnosis not present

## 2022-07-03 ENCOUNTER — Other Ambulatory Visit: Payer: Self-pay | Admitting: Internal Medicine

## 2022-07-03 DIAGNOSIS — Z1231 Encounter for screening mammogram for malignant neoplasm of breast: Secondary | ICD-10-CM

## 2022-07-16 ENCOUNTER — Inpatient Hospital Stay: Admission: RE | Admit: 2022-07-16 | Payer: PPO | Source: Ambulatory Visit

## 2022-08-01 DIAGNOSIS — Z1331 Encounter for screening for depression: Secondary | ICD-10-CM | POA: Diagnosis not present

## 2022-08-01 DIAGNOSIS — Z87891 Personal history of nicotine dependence: Secondary | ICD-10-CM | POA: Diagnosis not present

## 2022-08-01 DIAGNOSIS — Z1339 Encounter for screening examination for other mental health and behavioral disorders: Secondary | ICD-10-CM | POA: Diagnosis not present

## 2022-08-01 DIAGNOSIS — Z0000 Encounter for general adult medical examination without abnormal findings: Secondary | ICD-10-CM | POA: Diagnosis not present

## 2022-08-01 DIAGNOSIS — I10 Essential (primary) hypertension: Secondary | ICD-10-CM | POA: Diagnosis not present

## 2022-08-01 DIAGNOSIS — I77819 Aortic ectasia, unspecified site: Secondary | ICD-10-CM | POA: Diagnosis not present

## 2022-08-01 DIAGNOSIS — I719 Aortic aneurysm of unspecified site, without rupture: Secondary | ICD-10-CM | POA: Diagnosis not present

## 2022-08-01 DIAGNOSIS — Z299 Encounter for prophylactic measures, unspecified: Secondary | ICD-10-CM | POA: Diagnosis not present

## 2022-08-01 DIAGNOSIS — Z7189 Other specified counseling: Secondary | ICD-10-CM | POA: Diagnosis not present

## 2022-08-01 DIAGNOSIS — F339 Major depressive disorder, recurrent, unspecified: Secondary | ICD-10-CM | POA: Diagnosis not present

## 2022-08-15 DIAGNOSIS — I10 Essential (primary) hypertension: Secondary | ICD-10-CM | POA: Diagnosis not present

## 2022-08-15 DIAGNOSIS — E039 Hypothyroidism, unspecified: Secondary | ICD-10-CM | POA: Diagnosis not present

## 2022-08-15 DIAGNOSIS — I729 Aneurysm of unspecified site: Secondary | ICD-10-CM | POA: Diagnosis not present

## 2022-08-15 DIAGNOSIS — Z299 Encounter for prophylactic measures, unspecified: Secondary | ICD-10-CM | POA: Diagnosis not present

## 2022-08-30 ENCOUNTER — Telehealth: Payer: Self-pay | Admitting: Neurology

## 2022-08-30 MED ORDER — UBRELVY 100 MG PO TABS: 100.0000 mg | ORAL_TABLET | ORAL | 5 refills | Status: DC | PRN

## 2022-08-30 NOTE — Telephone Encounter (Signed)
Ubrelvy 100 mg as needed sent to the [facility] per Dr.Jaffe.

## 2022-08-30 NOTE — Telephone Encounter (Signed)
Patient left message she needs dr.jaffe to send in RX for sample that she tried and it worked for [employer] she needs it today

## 2022-08-30 NOTE — Telephone Encounter (Signed)
Per patient says she tried [place] and she really likes it. Please send it to there [facility]

## 2022-09-17 ENCOUNTER — Ambulatory Visit: Payer: PPO | Admitting: Neurology

## 2022-09-26 DIAGNOSIS — R002 Palpitations: Secondary | ICD-10-CM | POA: Diagnosis not present

## 2022-09-26 DIAGNOSIS — E860 Dehydration: Secondary | ICD-10-CM | POA: Diagnosis not present

## 2022-09-26 DIAGNOSIS — Z299 Encounter for prophylactic measures, unspecified: Secondary | ICD-10-CM | POA: Diagnosis not present

## 2022-09-26 DIAGNOSIS — Z8679 Personal history of other diseases of the circulatory system: Secondary | ICD-10-CM | POA: Diagnosis not present

## 2022-09-27 DIAGNOSIS — R002 Palpitations: Secondary | ICD-10-CM | POA: Diagnosis not present

## 2022-10-02 DIAGNOSIS — I10 Essential (primary) hypertension: Secondary | ICD-10-CM | POA: Diagnosis not present

## 2022-10-02 DIAGNOSIS — M6740 Ganglion, unspecified site: Secondary | ICD-10-CM | POA: Diagnosis not present

## 2022-10-02 DIAGNOSIS — E860 Dehydration: Secondary | ICD-10-CM | POA: Diagnosis not present

## 2022-10-02 DIAGNOSIS — Z299 Encounter for prophylactic measures, unspecified: Secondary | ICD-10-CM | POA: Diagnosis not present

## 2022-10-02 DIAGNOSIS — E441 Mild protein-calorie malnutrition: Secondary | ICD-10-CM | POA: Diagnosis not present

## 2022-10-03 DIAGNOSIS — Z299 Encounter for prophylactic measures, unspecified: Secondary | ICD-10-CM | POA: Diagnosis not present

## 2022-10-03 DIAGNOSIS — M67472 Ganglion, left ankle and foot: Secondary | ICD-10-CM | POA: Diagnosis not present

## 2022-10-03 DIAGNOSIS — I10 Essential (primary) hypertension: Secondary | ICD-10-CM | POA: Diagnosis not present

## 2022-10-14 DIAGNOSIS — Z299 Encounter for prophylactic measures, unspecified: Secondary | ICD-10-CM | POA: Diagnosis not present

## 2022-10-14 DIAGNOSIS — I719 Aortic aneurysm of unspecified site, without rupture: Secondary | ICD-10-CM | POA: Diagnosis not present

## 2022-10-14 DIAGNOSIS — I10 Essential (primary) hypertension: Secondary | ICD-10-CM | POA: Diagnosis not present

## 2022-10-14 DIAGNOSIS — R9389 Abnormal findings on diagnostic imaging of other specified body structures: Secondary | ICD-10-CM | POA: Diagnosis not present

## 2022-10-14 DIAGNOSIS — F319 Bipolar disorder, unspecified: Secondary | ICD-10-CM | POA: Diagnosis not present

## 2022-10-14 DIAGNOSIS — I77819 Aortic ectasia, unspecified site: Secondary | ICD-10-CM | POA: Diagnosis not present

## 2022-10-16 ENCOUNTER — Other Ambulatory Visit: Payer: Self-pay | Admitting: Surgery

## 2022-10-16 DIAGNOSIS — I7121 Aneurysm of the ascending aorta, without rupture: Secondary | ICD-10-CM

## 2022-11-29 ENCOUNTER — Telehealth: Payer: Self-pay | Admitting: Neurology

## 2022-11-29 ENCOUNTER — Other Ambulatory Visit: Payer: Self-pay | Admitting: Neurology

## 2022-11-29 ENCOUNTER — Ambulatory Visit: Payer: PPO | Admitting: Neurology

## 2022-11-29 MED ORDER — PREGABALIN 150 MG PO CAPS
150.0000 mg | ORAL_CAPSULE | Freq: Two times a day (BID) | ORAL | 5 refills | Status: DC
Start: 2022-11-29 — End: 2023-05-07

## 2022-11-29 NOTE — Telephone Encounter (Signed)
Patient is needing a refill on Pregablin 150mg  sent to [facility] in [place]

## 2022-11-29 NOTE — Telephone Encounter (Signed)
done

## 2022-12-02 ENCOUNTER — Telehealth: Payer: Self-pay | Admitting: Internal Medicine

## 2022-12-02 NOTE — Telephone Encounter (Signed)
Patient called stating she possibly needs another allergy test. Patient states she wants to review the results for the allergy tests and possibly retesting. Patient was unspecific about what and why she needed retesting.

## 2022-12-02 NOTE — Telephone Encounter (Signed)
When the testing was done she was negative and showed up to new things she was allergic too. Can not use epipen due to aortic aneurysm she wants to retest whole panels both enviro and foods being she still [facility] [place] please advise

## 2022-12-02 NOTE — Telephone Encounter (Signed)
Lm of pt to cal [place] back  Need to know why she thinks she needs more testing all enviro's was negative scratch and id's

## 2022-12-03 NOTE — Telephone Encounter (Signed)
Pt wants to do a challenge but at home for walnuts she wants to know what protocol you would want her top follow on this to be done at home.

## 2022-12-11 ENCOUNTER — Other Ambulatory Visit: Payer: PPO

## 2022-12-11 ENCOUNTER — Ambulatory Visit: Payer: PPO | Admitting: Surgery

## 2022-12-11 NOTE — Telephone Encounter (Signed)
Pt is not doing an at home challenge and wants to schedule a challenge

## 2022-12-16 ENCOUNTER — Ambulatory Visit (INDEPENDENT_AMBULATORY_CARE_PROVIDER_SITE_OTHER): Payer: No Typology Code available for payment source | Admitting: Internal Medicine

## 2022-12-16 ENCOUNTER — Encounter: Payer: Self-pay | Admitting: Internal Medicine

## 2022-12-16 VITALS — BP 120/76 | HR 79 | Temp 97.6°F | Resp 18 | Wt 132.4 lb

## 2022-12-16 DIAGNOSIS — T7805XD Anaphylactic reaction due to tree nuts and seeds, subsequent encounter: Secondary | ICD-10-CM | POA: Diagnosis not present

## 2022-12-16 NOTE — Progress Notes (Signed)
FOLLOW UP Date of Service/Encounter:  12/16/22   Subjective:  Teresa Galloway (DOB: 07/06/1955) is a 67 y.o. female who [facility] for follow up for chronic rhinitis and food allergies.   History obtained from: chart review and patient. Last visit, we discussed possibility of oral challenge to treenuts.  She was not interested as she has an ascending aortic aneurysm and did not wish to receive Epi even if she reacted. Also on Flonase/Azelastine.   She is here for walnut challenge.  She is very anxious.  On PRN Xanax that she took this morning.   Past Medical History: Past Medical History:  Diagnosis Date   Acute pericarditis 12/2013   Angio-edema    Bipolar II disorder    Possible diagnosis   Complication of anesthesia    Effects lasted for 3 days   Dyslipidemia    Generalized anxiety disorder with panic attacks    Hx of lithium toxicity    Hx of migraines    On propranolol.   Hypertension    Hypothyroid    Major depressive disorder 04/16/2013   Obstructive sleep apnea    Currently untreatred; oral device caused TMJ; unsuccessful with CPAP   Pericardial effusion 01/03/2014   Moderate without hemodynamic compromise.   Sinus tachycardia    Urticaria     Objective:  BP 116/76   Pulse 71   Temp 97.6 F (36.4 C)   Resp 16   Wt 132 lb 6 oz (60 kg)   SpO2 99%   BMI 21.05 kg/m  Body mass index is 21.05 kg/m. Pre Physical Exam: GEN: alert, well developed HEENT: clear conjunctiva, nose without  inferior turbinate hypertrophy, pink nasal mucosa, slight clear rhinorrhea HEART: regular rate and rhythm, no murmur LUNGS: clear to auscultation bilaterally, no coughing, unlabored respiration SKIN: no rashes or lesions  Post Physical Exam:  GEN: alert, well developed HEENT: clear conjunctiva, nose without  inferior turbinate hypertrophy, pink nasal mucosa, slight clear rhinorrhea HEART: regular rate and rhythm, no murmur LUNGS:  clear to auscultation bilaterally, no coughing, unlabored respiration SKIN: no rashes or lesions  Food Challenge: Start time: 9:25 AM End time: 1:30 PM Total dose: 10 walnuts halves split into 4 doses of 1/12, 1/6, 1/4 and 1/2.   Observed for 120 minutes after final dose.   She is overall very anxious. On Xanax routinely.  After second dose, she reported some throat irritation.  It resolved after drinking water.  No hives, diarrhea, vomiting, itching, shortness of breath, coughing.  Therefore, we continued with the challenge.   She did report some stomach pain and diarrhea at the end of the challenge; does have trouble with high fat foods and has known IBS.  Did not eat breakfast this morning either.  No other hives, vomiting, itching, throat irritation, SOB, coughing.  Vitals stable.  Discussed this is likely some GI intolerance from high fat content of walnuts.  Can eat walnuts as tolerated in smaller amounts as large amounts can trigger IBS.     Assessment:   1. Anaphylaxis due to tree nut, subsequent encounter     Plan/Recommendations:  Teresa Galloway was able to tolerate the walnut food challenge today at the office without adverse sighn or symptoms of an allergic reaction. Therefore, she has the same risk of systemic reaction associated with the consumption of walnut products as the general population. - Eat walnuts as tolerated.  We did discuss treenuts are high in fat and  can cause GI issues especially with her history of IBS.   - Okay to do home reintroduction of pecans.  Prior hx of throat itching and lip bumps, no high risk symptoms concerning for anaphylaxis.  sIgE and SPT negative to pecans.     Return if symptoms worsen or fail to improve.  Alesia Morin, MD [facility]

## 2022-12-16 NOTE — Patient Instructions (Addendum)
Nichele was able to tolerate the walnut food challenge today at the office without adverse sighn or symptoms of an allergic reaction. Therefore, she has the same risk of systemic reaction associated with the consumption of walnut products as the general population. - Eat walnuts as tolerated.  We did discuss treenuts are high in fat and can cause GI issues especially with her history of IBS.  So start off with eating small amounts.  - Okay to do home reintroduction of pecans.

## 2022-12-17 DIAGNOSIS — R42 Dizziness and giddiness: Secondary | ICD-10-CM | POA: Diagnosis not present

## 2022-12-17 DIAGNOSIS — Z299 Encounter for prophylactic measures, unspecified: Secondary | ICD-10-CM | POA: Diagnosis not present

## 2022-12-17 DIAGNOSIS — I77819 Aortic ectasia, unspecified site: Secondary | ICD-10-CM | POA: Diagnosis not present

## 2022-12-17 DIAGNOSIS — F419 Anxiety disorder, unspecified: Secondary | ICD-10-CM | POA: Diagnosis not present

## 2022-12-17 DIAGNOSIS — I10 Essential (primary) hypertension: Secondary | ICD-10-CM | POA: Diagnosis not present

## 2022-12-23 NOTE — Progress Notes (Deleted)
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 Hypertension Hyperlipidemia Restless leg syndrome    1  Secondary stroke prevention: -  ASA 81mg  daily - Atorvastatin 40mg  daily.  LDL goal less than 70. - Normotensive blood pressure - Hgb A1c goal less than 7 4.  RLS:  Lyrica 75mg  BID for now.  If symptoms worsen, we can increase dose back to 150mg  BID once LFTs normalize 5   Follow up 6 months.  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  History of Lithium toxicity in 2020, presenting with memory deficits/word-finding difficulty, profuse sweating, left arm pain and GI issues.  She stopped Lithium.  However, she continues to have profuse sweating, tingling itch across the face and hands (like a feather running across her skin.  She still has tingling and aching pain radiating down the left arm from shoulder down to the entire hand. No weakness.  No neck pain.  She does have history of cervical  C5-C6-C7 spine fusion 10 years ago.  01/28/2019 NCV-EMG:  normal.  02/19/2019 MRI Cervical Spine Wo:  ACDF C5-6 and C6-7 without stenosis; 2 mm anterolisthesis with central disc protrusion, uncinate spurring and moderate facet degeneration at C4-5 causing mild spinal stenosis and moderate foraminal stenosis bilaterally.  She started seeing a chiropractor which has been helpful.  If she turns her head to the left, she feels the pain and numbness down the left  shoulder and to the fingertips.     Restless Leg Syndrome: She has restless leg syndrome which lasts all day.  12/30/2018 LABS:  Ferritin 65, TSH 1.00, B12 >2,000.    Possible Left-sided V1 Trigeminal Neuralgia and Right-sided Facial Numbness. In 2021, she developed a twitch in her left eye.  She later developed a paroxysmal stabbing pain starting on top left parietal region radiating down to left eye.  It would last a couple of seconds.  It occurs several times a day (6 to 20 times a day).  Some left sided facial numbness.  No associated facial numbness, facial weakness or autonomic symptoms involving the eye.  No triggers.  She subsequently developed right-sided facial numbness.     Stroke: Due to headaches and left facial numbness, she had an MRI and MRA of the brain on 11/25/2020 which revealed mild-moderate chronic small vessel ischemic changes and remote small left cerebellar infarct as well as a 4 x 4 x 2 mm left MCA bifurcation aneurysm.  On 12/13/2020, she underwent endovascular treatment for the aneurysm.  Following the procedure, it was noted that she exhibited right sided weakness, vision loss, some word-finding difficulty with paraphasic errors and slight hesitancy of speech.  No unilateral numbness or weakness.  MRI of brain showed scattered acute infarcts within the left MCA distribution.  MRA of head showed sequelae of interval endovascular treatment of the aneurysm with pipeline flow diverter stent without any hemodynamically significant stenosis.  Echocardiogram showed EF 60-65% but intraatrial septum was not well-visualized.  Lower extremity venous ultrasound negative for  DVT.  LDL was 101, Hgb A1c 5.2 and SARS Coronavirus 2 negative.  She was already taking ASA 81mg  on day of admission.  She was discharged on ASA 81mg  and Plavix 75mg  and started on atorvastatin 40mg .  Due to heavy and large bruising/hematomas, Plavix was reduced to 37.5mg  daily while she was in rehab.  She cannot remember  much during her 3 days in the hospital.  She reports visual aura described as sparkles and pinwheels in her vision.  She reports weakness of her right ankle.  She was admitted to [facility] for suspected TIA.  She felt dizzy and massed out.  No lateralizing symptoms.  CTA head and neck revealed postsurgical changes secondary to pipeline stent of MCA bifurcation aneurysm  with unchanged persistent filling of treated aneurysm measuring 4 x 3 mm.  No emergent large vessel occlusion.  MRI of brain showed showed no acute infarct or hemorrhage.  She was observed overnight    She had follow up MRI/MRA of head on 05/02/2021 personally reviewed which still revealed 4 x 3 mm left MCA bifurcation aneurysm, confirmed (3.3-3.6 mm) on cerebral arteriogram on 05/14/2021.  Advised by Dr. Corliss Skains to take ASA 81mg  daily and Plavix 37.5mg  every other day (P2Y12 level was 86).  Repeat MRI/MRA of brain on 10/22/2021 was stable with no acute findings and no flow related signal within the treated left MCA aneurysm.  Repeat MRA of head on 04/25/2022 was stable with patent stent within the distal left MCA and no residual aneurysm.  She was advised by Dr. Corliss Skains to discontinue Plavix but to continue ASA 81mg  daily.    Subjective Memory Deficits: She continued to have short-term memory deficits.  12/30/2018 LABS:  TSH 1.00, B12 >2,000.  She underwent neuropsychological testing with Dr. Milbert Coulter on 03/25/2019, which did not demonstrate evidence of neurocognitive deficits.  Symptoms were thought to be possibly related to severe depression and anxiety and pharmacologic effect.      Past medications:  lithium; gabapentin (muscle jerks), ropinrole (lost efficacy), Cymbalta, melatonin (she feels "wired"), indomethacin (side effects, ineffective), pramipexole (discontinued due to augmentation), rizatriptan, baclofen  PAST MEDICAL HISTORY: Past Medical History:  Diagnosis Date   Acute pericarditis 12/2013   Angio-edema     Bipolar II disorder    Possible diagnosis   Complication of anesthesia    Effects lasted for 3 days   Dyslipidemia    Generalized anxiety disorder with panic attacks    Hx of lithium toxicity    Hx of migraines    On propranolol.   Hypertension    Hypothyroid    Major depressive disorder 04/16/2013   Obstructive sleep apnea    Currently untreatred; oral device caused TMJ; unsuccessful with CPAP   Pericardial effusion 01/03/2014   Moderate without hemodynamic compromise.   Sinus tachycardia    Urticaria     MEDICATIONS: Current Outpatient Medications on File Prior to Visit  Medication Sig Dispense Refill   ALPRAZolam (XANAX) 0.25 MG tablet Take 0.25 mg by mouth 4 (four) times daily as needed for anxiety.      aspirin EC 81 MG tablet Take 81 mg by mouth daily. Swallow whole.     atorvastatin (LIPITOR) 40 MG tablet Take 1 tablet (40 mg total) by mouth daily. 30 tablet 1   azelastine (ASTELIN) 0.1 % nasal spray Place 1 spray into both nostrils 2 (two) times daily. Use in each nostril as directed 30 mL 5   carboxymethylcellulose (REFRESH PLUS) 0.5 % SOLN Place  1 drop into both eyes 3 (three) times daily as needed (dry eyes).     dicyclomine (BENTYL) 10 MG capsule Take 1 capsule (10 mg total) by mouth 3 (three) times daily before meals. (Patient taking differently: Take 10 mg by mouth as needed for spasms.) 90 capsule 4   DULoxetine (CYMBALTA) 60 MG capsule Take 60 mg by mouth 2 (two) times daily.     EPIPEN 2-PAK 0.3 MG/0.3ML SOAJ injection Inject 0.3 mg into the muscle as needed for anaphylaxis. (Patient not taking: Reported on 12/16/2022) 2 each 1   Eyelid Cleansers (AVENOVA) 0.01 % SOLN Apply 1 application topically 2 (two) times daily as needed (eyelid cleanser).     fluticasone (FLONASE) 50 MCG/ACT nasal spray Place 2 sprays into both nostrils daily. 16 g 5   levothyroxine (SYNTHROID, LEVOTHROID) 75 MCG tablet Take 75 mcg by mouth daily before breakfast.     omeprazole (PRILOSEC) 20  MG capsule Take 20 mg by mouth daily.     pantoprazole (PROTONIX) 40 MG tablet Take 40 mg by mouth daily.     pregabalin (LYRICA) 150 MG capsule Take 1 capsule (150 mg total) by mouth 2 (two) times daily. 60 capsule 5   propranolol (INDERAL) 60 MG tablet Take 60 mg by mouth daily.     tiZANidine (ZANAFLEX) 4 MG tablet Take 4 mg by mouth 2 (two) times daily as needed.     traZODone (DESYREL) 100 MG tablet Take 100 mg by mouth at bedtime as needed for sleep.     Ubrogepant (UBRELVY) 100 MG TABS Take 1 tablet (100 mg total) by mouth as needed (take 1 tab at the earlist onset of a Migraine, May repeat in 2 hour. Max 2 tabs in 24 hours). 16 tablet 5   valsartan (DIOVAN) 80 MG tablet Take 80 mg by mouth daily.     No current facility-administered medications on file prior to visit.    ALLERGIES: Allergies  Allergen Reactions   Other Anaphylaxis, Swelling and Other (See Comments)    Walnuts or any nuts   Sulfa Antibiotics Shortness Of Breath and Other (See Comments)    Swelling of lips    Sunscreens Anaphylaxis, Hives and Other (See Comments)    Titanium and zinc oxides Eye swelling    Lamictal [Lamotrigine]     Weird Ticks    Seroquel [Quetiapine] Other (See Comments)    Pychosis (foggy, uncoordinated, slurred speech)   Tape Hives    FAMILY HISTORY: Family History  Problem Relation Age of Onset   Depression Mother    Depression Father    Depression Sister    Anxiety disorder Sister    Alcohol abuse Maternal Uncle    Alcohol abuse Paternal Uncle    Alcohol abuse Maternal Grandfather    Depression Maternal Grandmother       Objective:  *** General: No acute distress.  Patient appears ***-groomed.   Head:  Normocephalic/atraumatic Eyes:  Fundi examined but not visualized Neck: supple, no paraspinal tenderness, full range of motion Heart:  Regular rate and rhythm Lungs:  Clear to auscultation bilaterally Back: No paraspinal tenderness Neurological Exam: alert and oriented.   Speech fluent and not dysarthric, language intact.  CN II-XII intact. Bulk and tone normal, muscle strength 5/5 throughout.  Sensation to light touch intact.  Deep tendon reflexes 2+ throughout, toes downgoing.  Finger to nose testing intact.  Gait normal, Romberg negative.   Shon Millet, DO  CC: ***

## 2022-12-24 ENCOUNTER — Ambulatory Visit: Payer: PPO | Admitting: Neurology

## 2022-12-31 ENCOUNTER — Encounter (INDEPENDENT_AMBULATORY_CARE_PROVIDER_SITE_OTHER): Payer: Self-pay | Admitting: *Deleted

## 2023-01-06 DIAGNOSIS — M10021 Idiopathic gout, right elbow: Secondary | ICD-10-CM | POA: Diagnosis not present

## 2023-01-06 DIAGNOSIS — Z299 Encounter for prophylactic measures, unspecified: Secondary | ICD-10-CM | POA: Diagnosis not present

## 2023-01-06 DIAGNOSIS — R52 Pain, unspecified: Secondary | ICD-10-CM | POA: Diagnosis not present

## 2023-01-06 DIAGNOSIS — I10 Essential (primary) hypertension: Secondary | ICD-10-CM | POA: Diagnosis not present

## 2023-01-16 ENCOUNTER — Telehealth: Payer: Self-pay | Admitting: Internal Medicine

## 2023-01-16 NOTE — Telephone Encounter (Signed)
Patient's authorization, [id number] expires 02-11-2023.   Left message inquiring whether patient would like renewal or not. Last visit AVS says to follow up as needed.

## 2023-01-27 DIAGNOSIS — M25521 Pain in right elbow: Secondary | ICD-10-CM | POA: Diagnosis not present

## 2023-01-27 DIAGNOSIS — I10 Essential (primary) hypertension: Secondary | ICD-10-CM | POA: Diagnosis not present

## 2023-01-27 DIAGNOSIS — Z299 Encounter for prophylactic measures, unspecified: Secondary | ICD-10-CM | POA: Diagnosis not present

## 2023-01-27 DIAGNOSIS — R52 Pain, unspecified: Secondary | ICD-10-CM | POA: Diagnosis not present

## 2023-02-24 NOTE — Telephone Encounter (Signed)
Patient called and stated she will like a renewal for a new authorization.

## 2023-02-24 NOTE — Progress Notes (Unsigned)
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 Migraine without aura, without status migrainosus, not intractable Restless leg syndrome Memory deficits New paroxysmal left parietal headache     Due to new headaches in same location of aneurysm, will repeat MRA of head now. Repeat neuropsychological evaluation for worsening memory For migraines:  Ubrelvy 100mg  as needed For RLS:  Lyrica 150mg  twice daily ASA 81mg  daily; secondary stroke prevention as managed by PCP Follow up 6 months.    Subjective:  Teresa Galloway is a 67 year old female with IBS, thoracic aortic aneurysm, migraine, depression, anxiety, Bipolar disorder and OSA   UPDATE: For migraines:  She is doing well.  She only used Ubrelvy once or twice in past 6 months.   For RLS:  LFTs normalized and now Lyrica back up to 150mg  BID.  Now controlled.   Memory:  She notes memory is worse.  Difficult to focus.  Loses train of thought.  Word finding issues. New Headaches:  she notes new headaches over the past 6 months.  Different than the left stabbing parietal headache, but in the same location.  It is a brief non-throbbing localized pain lasting 10-15 seconds and recurring throughout day.  Not everyday.       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  History of Lithium toxicity in 2020, presenting with memory deficits/word-finding difficulty, profuse sweating, left arm pain and GI issues.  She stopped Lithium.  However, she continues to have profuse sweating, tingling itch across the face and hands (like a feather running across her skin.  She still has tingling and aching pain radiating down the left arm from shoulder down to the entire hand. No weakness.  No neck pain.  She  does have history of cervical  C5-C6-C7 spine fusion 10 years ago.  01/28/2019 NCV-EMG:  normal.  02/19/2019 MRI Cervical Spine Wo:  ACDF C5-6 and C6-7 without stenosis; 2 mm anterolisthesis with central disc protrusion, uncinate spurring and moderate facet degeneration at C4-5 causing mild spinal stenosis and moderate foraminal stenosis bilaterally.  She started seeing a chiropractor which has been helpful.  If she turns her head to the left, she feels the pain and numbness down the left shoulder and to the fingertips.     Restless Leg Syndrome: She has restless leg syndrome which lasts all day.  12/30/2018 LABS:  Ferritin 65, TSH 1.00, B12 >2,000.   Migraine Without Aura: Severe diffuse pressure headache with nausea, photophobia and phonophobia.  Occurs infrequently   Possible Left-sided V1 Trigeminal Neuralgia and Right-sided Facial Numbness. In 2021, she developed a twitch in her left eye.  She later developed a paroxysmal stabbing pain starting on top left parietal region radiating down to left eye.  It would last a couple of seconds.  It occurs several times a day (6 to 20 times a day).  Some left sided facial numbness.  No associated facial numbness, facial weakness or autonomic symptoms involving the eye.  No triggers.  She subsequently developed right-sided facial numbness.     Stroke: Due to headaches and left facial numbness, she had an MRI and MRA of the brain on 11/25/2020 which revealed mild-moderate chronic small vessel ischemic changes and remote small left cerebellar infarct as well as a 4 x 4 x 2  mm left MCA bifurcation aneurysm.  On 12/13/2020, she underwent endovascular treatment for the aneurysm.  Following the procedure, it was noted that she exhibited right sided weakness, vision loss, some word-finding difficulty with paraphasic errors and slight hesitancy of speech.  No unilateral numbness or weakness.  MRI of brain showed scattered acute infarcts within the left MCA distribution.   MRA of head showed sequelae of interval endovascular treatment of the aneurysm with pipeline flow diverter stent without any hemodynamically significant stenosis.  Echocardiogram showed EF 60-65% but intraatrial septum was not well-visualized.  Lower extremity venous ultrasound negative for DVT.  LDL was 101, Hgb A1c 5.2 and SARS Coronavirus 2 negative.  She was already taking ASA 81mg  on day of admission.  She was discharged on ASA 81mg  and Plavix 75mg  and started on atorvastatin 40mg .  Due to heavy and large bruising/hematomas, Plavix was reduced to 37.5mg  daily while she was in rehab.  She cannot remember much during her 3 days in the hospital.  She reports visual aura described as sparkles and pinwheels in her vision.  She reports weakness of her right ankle.  She was admitted to [facility] for suspected TIA.  She felt dizzy and massed out.  No lateralizing symptoms.  CTA head and neck revealed postsurgical changes secondary to pipeline stent of MCA bifurcation aneurysm  with unchanged persistent filling of treated aneurysm measuring 4 x 3 mm.  No emergent large vessel occlusion.  MRI of brain showed showed no acute infarct or hemorrhage.  She was observed overnight    She had follow up MRI/MRA of head on 05/02/2021 personally reviewed which still revealed 4 x 3 mm left MCA bifurcation aneurysm, confirmed (3.3-3.6 mm) on cerebral arteriogram on 05/14/2021.  Advised by Dr. Corliss Skains to take ASA 81mg  daily and Plavix 37.5mg  every other day (P2Y12 level was 86).  Repeat MRI/MRA of brain on 10/22/2021 was stable with no acute findings and no flow related signal within the treated left MCA aneurysm.  Repeat MRA of head on 04/25/2022 was stable with patent stent within the distal left MCA and no residual aneurysm.  She was advised by Dr. Corliss Skains to discontinue Plavix but to continue ASA 81mg  daily.   Subjective Memory Deficits: She continued to have short-term memory deficits.  12/30/2018 LABS:  TSH  1.00, B12 >2,000.  She underwent neuropsychological testing with Dr. Milbert Coulter on 03/25/2019, which did not demonstrate evidence of neurocognitive deficits.  Symptoms were thought to be possibly related to severe depression and anxiety and pharmacologic effect.      Past medications:  lithium; gabapentin (muscle jerks), ropinrole (lost efficacy), Cymbalta, melatonin (she feels "wired"), indomethacin (side effects, ineffective), pramipexole (discontinued due to augmentation), rizatriptan, baclofen  PAST MEDICAL HISTORY: Past Medical History:  Diagnosis Date   Acute pericarditis 12/2013   Angio-edema    Bipolar II disorder    Possible diagnosis   Complication of anesthesia    Effects lasted for 3 days   Dyslipidemia    Generalized anxiety disorder with panic attacks    Hx of lithium toxicity    Hx of migraines    On propranolol.   Hypertension    Hypothyroid    Major depressive disorder 04/16/2013   Obstructive sleep apnea    Currently untreatred; oral device caused TMJ; unsuccessful with CPAP   Pericardial effusion 01/03/2014   Moderate without hemodynamic compromise.   Sinus tachycardia    Urticaria     MEDICATIONS: Current Outpatient Medications on File Prior to Visit  Medication Sig Dispense Refill   ALPRAZolam (XANAX) 0.25 MG tablet Take 0.25 mg by mouth 4 (four) times daily as needed for anxiety.      aspirin EC 81 MG tablet Take 81 mg by mouth daily. Swallow whole.     atorvastatin (LIPITOR) 40 MG tablet Take 1 tablet (40 mg total) by mouth daily. 30 tablet 1   azelastine (ASTELIN) 0.1 % nasal spray Place 1 spray into both nostrils 2 (two) times daily. Use in each nostril as directed 30 mL 5   carboxymethylcellulose (REFRESH PLUS) 0.5 % SOLN Place 1 drop into both eyes 3 (three) times daily as needed (dry eyes).     dicyclomine (BENTYL) 10 MG capsule Take 1 capsule (10 mg total) by mouth 3 (three) times daily before meals. (Patient taking differently: Take 10 mg by mouth as  needed for spasms.) 90 capsule 4   DULoxetine (CYMBALTA) 60 MG capsule Take 60 mg by mouth 2 (two) times daily.     EPIPEN 2-PAK 0.3 MG/0.3ML SOAJ injection Inject 0.3 mg into the muscle as needed for anaphylaxis. (Patient not taking: Reported on 12/16/2022) 2 each 1   Eyelid Cleansers (AVENOVA) 0.01 % SOLN Apply 1 application topically 2 (two) times daily as needed (eyelid cleanser).     fluticasone (FLONASE) 50 MCG/ACT nasal spray Place 2 sprays into both nostrils daily. 16 g 5   levothyroxine (SYNTHROID, LEVOTHROID) 75 MCG tablet Take 75 mcg by mouth daily before breakfast.     omeprazole (PRILOSEC) 20 MG capsule Take 20 mg by mouth daily.     pantoprazole (PROTONIX) 40 MG tablet Take 40 mg by mouth daily.     pregabalin (LYRICA) 150 MG capsule Take 1 capsule (150 mg total) by mouth 2 (two) times daily. 60 capsule 5   propranolol (INDERAL) 60 MG tablet Take 60 mg by mouth daily.     tiZANidine (ZANAFLEX) 4 MG tablet Take 4 mg by mouth 2 (two) times daily as needed.     traZODone (DESYREL) 100 MG tablet Take 100 mg by mouth at bedtime as needed for sleep.     Ubrogepant (UBRELVY) 100 MG TABS Take 1 tablet (100 mg total) by mouth as needed (take 1 tab at the earlist onset of a Migraine, May repeat in 2 hour. Max 2 tabs in 24 hours). 16 tablet 5   valsartan (DIOVAN) 80 MG tablet Take 80 mg by mouth daily.     No current facility-administered medications on file prior to visit.    ALLERGIES: Allergies  Allergen Reactions   Other Anaphylaxis, Swelling and Other (See Comments)    Walnuts or any nuts   Sulfa Antibiotics Shortness Of Breath and Other (See Comments)    Swelling of lips    Sunscreens Anaphylaxis, Hives and Other (See Comments)    Titanium and zinc oxides Eye swelling    Lamictal [Lamotrigine]     Weird Ticks    Seroquel [Quetiapine] Other (See Comments)    Pychosis (foggy, uncoordinated, slurred speech)   Tape Hives    FAMILY HISTORY: Family History  Problem Relation Age  of Onset   Depression Mother    Depression Father    Depression Sister    Anxiety disorder Sister    Alcohol abuse Maternal Uncle    Alcohol abuse Paternal Uncle    Alcohol abuse Maternal Grandfather    Depression Maternal Grandmother       Objective:  Blood pressure (!) 91/56, pulse 86, height 5\' 6"  (1.676 m), weight  136 lb (61.7 kg), SpO2 96%. General: No acute distress.  Patient appears well-groomed.   Head:  Normocephalic/atraumatic Eyes:  Fundi examined but not visualized Neck: supple, no paraspinal tenderness, full range of motion Heart:  Regular rate and rhythm Lungs:  Clear to auscultation bilaterally Back: No paraspinal tenderness Neurological Exam: alert and oriented.  Speech fluent and not dysarthric, language intact.  CN II-XII intact. Bulk and tone normal, muscle strength 5/5 throughout.  Sensation to light touch intact.  Deep tendon reflexes 2+ throughout, toes downgoing.  Finger to nose testing intact.  Gait normal, Romberg negative.   Shon Millet, DO  CC: Doreen Beam, MD

## 2023-02-25 ENCOUNTER — Ambulatory Visit (INDEPENDENT_AMBULATORY_CARE_PROVIDER_SITE_OTHER): Payer: PPO | Admitting: Neurology

## 2023-02-25 ENCOUNTER — Encounter: Payer: Self-pay | Admitting: Neurology

## 2023-02-25 VITALS — BP 91/56 | HR 86 | Ht 66.0 in | Wt 136.0 lb

## 2023-02-25 DIAGNOSIS — I63412 Cerebral infarction due to embolism of left middle cerebral artery: Secondary | ICD-10-CM

## 2023-02-25 DIAGNOSIS — G43009 Migraine without aura, not intractable, without status migrainosus: Secondary | ICD-10-CM

## 2023-02-25 DIAGNOSIS — G2581 Restless legs syndrome: Secondary | ICD-10-CM

## 2023-02-25 DIAGNOSIS — I671 Cerebral aneurysm, nonruptured: Secondary | ICD-10-CM

## 2023-02-25 MED ORDER — UBRELVY 100 MG PO TABS: 100.0000 mg | ORAL_TABLET | ORAL | 5 refills | Status: AC | PRN

## 2023-02-25 NOTE — Patient Instructions (Addendum)
Lyrica 150mg  twice daily Ubrelvy as needed for migraines Check MRA of head  We have sent a referral to [facility] for your MRA and they will call you directly to schedule your appointment. They are located at [address] [facility]. If you need to contact them directly please call [phone].  Repeat neurocognitive evaluation Follow up 6 months.

## 2023-02-25 NOTE — Telephone Encounter (Signed)
Patients new auth request has been faxed to the [facility]. I have sent the patient a mychart message informing her of this information.

## 2023-03-07 DIAGNOSIS — L03019 Cellulitis of unspecified finger: Secondary | ICD-10-CM | POA: Diagnosis not present

## 2023-03-07 DIAGNOSIS — M25521 Pain in right elbow: Secondary | ICD-10-CM | POA: Diagnosis not present

## 2023-03-07 DIAGNOSIS — Z299 Encounter for prophylactic measures, unspecified: Secondary | ICD-10-CM | POA: Diagnosis not present

## 2023-03-07 DIAGNOSIS — R197 Diarrhea, unspecified: Secondary | ICD-10-CM | POA: Diagnosis not present

## 2023-03-11 DIAGNOSIS — R52 Pain, unspecified: Secondary | ICD-10-CM | POA: Diagnosis not present

## 2023-03-11 DIAGNOSIS — M79631 Pain in right forearm: Secondary | ICD-10-CM | POA: Diagnosis not present

## 2023-03-11 DIAGNOSIS — M109 Gout, unspecified: Secondary | ICD-10-CM | POA: Diagnosis not present

## 2023-03-11 DIAGNOSIS — I10 Essential (primary) hypertension: Secondary | ICD-10-CM | POA: Diagnosis not present

## 2023-03-11 DIAGNOSIS — Z87891 Personal history of nicotine dependence: Secondary | ICD-10-CM | POA: Diagnosis not present

## 2023-03-11 DIAGNOSIS — Z0000 Encounter for general adult medical examination without abnormal findings: Secondary | ICD-10-CM | POA: Diagnosis not present

## 2023-03-11 DIAGNOSIS — Z299 Encounter for prophylactic measures, unspecified: Secondary | ICD-10-CM | POA: Diagnosis not present

## 2023-03-11 DIAGNOSIS — F339 Major depressive disorder, recurrent, unspecified: Secondary | ICD-10-CM | POA: Diagnosis not present

## 2023-04-07 DIAGNOSIS — M6281 Muscle weakness (generalized): Secondary | ICD-10-CM | POA: Diagnosis not present

## 2023-04-07 DIAGNOSIS — M25521 Pain in right elbow: Secondary | ICD-10-CM | POA: Diagnosis not present

## 2023-04-08 DIAGNOSIS — Z299 Encounter for prophylactic measures, unspecified: Secondary | ICD-10-CM | POA: Diagnosis not present

## 2023-04-08 DIAGNOSIS — M7711 Lateral epicondylitis, right elbow: Secondary | ICD-10-CM | POA: Diagnosis not present

## 2023-04-08 DIAGNOSIS — I10 Essential (primary) hypertension: Secondary | ICD-10-CM | POA: Diagnosis not present

## 2023-04-11 DIAGNOSIS — M6281 Muscle weakness (generalized): Secondary | ICD-10-CM | POA: Diagnosis not present

## 2023-04-11 DIAGNOSIS — M25521 Pain in right elbow: Secondary | ICD-10-CM | POA: Diagnosis not present

## 2023-04-15 ENCOUNTER — Inpatient Hospital Stay: Admission: RE | Admit: 2023-04-15 | Payer: PPO | Source: Ambulatory Visit

## 2023-04-15 DIAGNOSIS — M25521 Pain in right elbow: Secondary | ICD-10-CM | POA: Diagnosis not present

## 2023-04-15 DIAGNOSIS — M6281 Muscle weakness (generalized): Secondary | ICD-10-CM | POA: Diagnosis not present

## 2023-04-16 DIAGNOSIS — M109 Gout, unspecified: Secondary | ICD-10-CM | POA: Diagnosis not present

## 2023-04-16 DIAGNOSIS — J42 Unspecified chronic bronchitis: Secondary | ICD-10-CM | POA: Diagnosis not present

## 2023-04-16 DIAGNOSIS — Z299 Encounter for prophylactic measures, unspecified: Secondary | ICD-10-CM | POA: Diagnosis not present

## 2023-04-16 DIAGNOSIS — D692 Other nonthrombocytopenic purpura: Secondary | ICD-10-CM | POA: Diagnosis not present

## 2023-04-16 DIAGNOSIS — I10 Essential (primary) hypertension: Secondary | ICD-10-CM | POA: Diagnosis not present

## 2023-04-16 DIAGNOSIS — I719 Aortic aneurysm of unspecified site, without rupture: Secondary | ICD-10-CM | POA: Diagnosis not present

## 2023-04-21 DIAGNOSIS — M6281 Muscle weakness (generalized): Secondary | ICD-10-CM | POA: Diagnosis not present

## 2023-04-21 DIAGNOSIS — M25521 Pain in right elbow: Secondary | ICD-10-CM | POA: Diagnosis not present

## 2023-04-22 ENCOUNTER — Other Ambulatory Visit (HOSPITAL_COMMUNITY): Payer: Self-pay | Admitting: Interventional Radiology

## 2023-04-22 DIAGNOSIS — I671 Cerebral aneurysm, nonruptured: Secondary | ICD-10-CM

## 2023-04-24 DIAGNOSIS — M6281 Muscle weakness (generalized): Secondary | ICD-10-CM | POA: Diagnosis not present

## 2023-04-24 DIAGNOSIS — M25521 Pain in right elbow: Secondary | ICD-10-CM | POA: Diagnosis not present

## 2023-04-30 ENCOUNTER — Other Ambulatory Visit (HOSPITAL_COMMUNITY): Payer: Self-pay | Admitting: Neurology

## 2023-04-30 ENCOUNTER — Telehealth: Payer: Self-pay

## 2023-04-30 DIAGNOSIS — I671 Cerebral aneurysm, nonruptured: Secondary | ICD-10-CM

## 2023-05-01 ENCOUNTER — Telehealth (HOSPITAL_COMMUNITY): Payer: Self-pay

## 2023-05-01 NOTE — Telephone Encounter (Signed)
Called pt to inform her that after further investigation from the [place], Dr. Corliss Skains is no longer in network with them. I did let Dr. Everlena Cooper know as well. Her MRA will now be ordered under Dr. Everlena Cooper. Pt thanked me for calling and explaining and will continue f/u's with Dr. Everlena Cooper. AB

## 2023-05-01 NOTE — Telephone Encounter (Signed)
PA needed for Ubrelvy 100 mg

## 2023-05-05 ENCOUNTER — Ambulatory Visit (HOSPITAL_COMMUNITY)
Admission: RE | Admit: 2023-05-05 | Discharge: 2023-05-05 | Disposition: A | Discharge status: Home / Self Care | Payer: No Typology Code available for payment source | Source: Ambulatory Visit | Attending: Interventional Radiology | Admitting: Interventional Radiology

## 2023-05-05 DIAGNOSIS — I671 Cerebral aneurysm, nonruptured: Secondary | ICD-10-CM | POA: Insufficient documentation

## 2023-05-07 ENCOUNTER — Other Ambulatory Visit: Payer: Self-pay | Admitting: Neurology

## 2023-05-07 ENCOUNTER — Telehealth: Payer: Self-pay | Admitting: Neurology

## 2023-05-07 MED ORDER — PREGABALIN 150 MG PO CAPS: 150.0000 mg | ORAL_CAPSULE | Freq: Two times a day (BID) | ORAL | 5 refills | Status: DC

## 2023-05-07 NOTE — Telephone Encounter (Signed)
Patient has changed pharmacy to [facility] in [place] and needs [place] to send in the Lyrica RX to them so that she can pick it up

## 2023-05-07 NOTE — Telephone Encounter (Signed)
Done

## 2023-05-09 ENCOUNTER — Telehealth: Payer: Self-pay | Admitting: Neurology

## 2023-05-09 NOTE — Telephone Encounter (Signed)
Pt called in to see if the results of her MRA were back?

## 2023-05-09 NOTE — Telephone Encounter (Signed)
Pt called an informed that we do not have the results back that it is taken about 2 weeks to get the results

## 2023-05-12 ENCOUNTER — Telehealth: Payer: Self-pay | Admitting: Neurology

## 2023-05-12 NOTE — Telephone Encounter (Signed)
[facility] called regarding patients MRI that was done on 05/05/23. They are requesting records sent to them. Danna spoke with sheena about this.   [facility] [phone] ext [phone]

## 2023-05-16 ENCOUNTER — Emergency Department (HOSPITAL_COMMUNITY): Payer: No Typology Code available for payment source

## 2023-05-16 ENCOUNTER — Emergency Department (HOSPITAL_COMMUNITY)
Admission: EM | Admit: 2023-05-16 | Discharge: 2023-05-16 | Disposition: A | Discharge status: Home / Self Care | Payer: No Typology Code available for payment source | Attending: Emergency Medicine | Admitting: Emergency Medicine

## 2023-05-16 DIAGNOSIS — I10 Essential (primary) hypertension: Secondary | ICD-10-CM | POA: Diagnosis not present

## 2023-05-16 DIAGNOSIS — R531 Weakness: Secondary | ICD-10-CM | POA: Diagnosis not present

## 2023-05-16 DIAGNOSIS — R197 Diarrhea, unspecified: Secondary | ICD-10-CM | POA: Insufficient documentation

## 2023-05-16 DIAGNOSIS — Z7982 Long term (current) use of aspirin: Secondary | ICD-10-CM | POA: Diagnosis not present

## 2023-05-16 LAB — COMPREHENSIVE METABOLIC PANEL
ALT: 30 U/L (ref 0–44)
AST: 29 U/L (ref 15–41)
Albumin: 4 g/dL (ref 3.5–5.0)
Alkaline Phosphatase: 74 U/L (ref 38–126)
Anion gap: 11 (ref 5–15)
BUN: 18 mg/dL (ref 8–23)
CO2: 28 mmol/L (ref 22–32)
Calcium: 9.5 mg/dL (ref 8.9–10.3)
Chloride: 97 mmol/L — ABNORMAL LOW (ref 98–111)
Creatinine, Ser: 0.81 mg/dL (ref 0.44–1.00)
GFR, Estimated: >60 mL/min (ref >60)
Glucose, Bld: 72 mg/dL (ref 70–99)
Potassium: 3.8 mmol/L (ref 3.5–5.1)
Sodium: 136 mmol/L (ref 135–145)
Total Bilirubin: 1.1 mg/dL (ref 0.0–1.2)
Total Protein: 7 g/dL (ref 6.5–8.1)

## 2023-05-16 LAB — CBC
HCT: 39.9 % (ref 36.0–46.0)
Hemoglobin: 13.6 g/dL (ref 12.0–15.0)
MCH: 31.6 pg (ref 26.0–34.0)
MCHC: 34.1 g/dL (ref 30.0–36.0)
MCV: 92.6 fL (ref 80.0–100.0)
Platelets: 232 10*3/uL (ref 150–400)
RBC: 4.31 MIL/uL (ref 3.87–5.11)
RDW: 12.6 % (ref 11.5–15.5)
WBC: 7.4 10*3/uL (ref 4.0–10.5)
nRBC: 0 % (ref 0.0–0.2)

## 2023-05-16 LAB — URINALYSIS, ROUTINE W REFLEX MICROSCOPIC
Bilirubin Urine: NEGATIVE
Glucose, UA: NEGATIVE mg/dL
Hgb urine dipstick: NEGATIVE
Ketones, ur: NEGATIVE mg/dL
Leukocytes,Ua: NEGATIVE
Nitrite: NEGATIVE
Protein, ur: NEGATIVE mg/dL
Specific Gravity, Urine: 1.015 (ref 1.005–1.030)
pH: 6 (ref 5.0–8.0)

## 2023-05-16 LAB — LIPASE, BLOOD: Lipase: 31 U/L (ref 11–51)

## 2023-05-16 MED ORDER — IOHEXOL 300 MG/ML  SOLN
75.0000 mL | Freq: Once | INTRAMUSCULAR | Status: AC | PRN
  Administered 2023-05-16: 75 mL via INTRAVENOUS

## 2023-05-16 MED ORDER — SODIUM CHLORIDE 0.9 % IV BOLUS
1000.0000 mL | Freq: Once | INTRAVENOUS | Status: AC
  Administered 2023-05-16: 1000 mL via INTRAVENOUS

## 2023-05-16 NOTE — Discharge Instructions (Signed)
 You may continue your current regimen for your diarrhea.  Drink plenty of fluids.  I have given you follow-up information for local GI.  I recommend you contact their office to arrange follow-up appointment regarding your diarrhea.  Also as discussed your CT today confirms your descending thoracic aortic aneurysm that seems to be stable, but I do recommend that you follow-up with your neurologist that has been following this.  This was incidental finding as you were being evaluated today for your diarrhea.  Return to the emergency department for any new or worsening symptoms.

## 2023-05-16 NOTE — ED Triage Notes (Signed)
 Pt complains of diarrhea for 2 weeks, states that she had episodes where she couldn't control the urgency to go. States that she feels weak and dehydrated, also endorses dizziness when changing position. Hx of IBS, has used normal IBS med but they has not helped with symptoms.

## 2023-05-19 NOTE — ED Provider Notes (Signed)
 [place] [facility]ERGENCY DEPARTMENT AT [facility] Provider Note   CSN: [id number] Arrival date & time: 05/16/23  1005     History  Chief Complaint  Patient presents with   Diarrhea    Pt complains of diarrhea for 2 weeks, states that she had episodes where she couldn't control the urgency to go. States that she feels weak and dehydrated, also endorses dizziness when changing position. Hx of IBS, has used normal IBS med but they has not helped with symptoms. Being monitored for an aortic aneurysm     Teresa Galloway is a 68 y.o. female.   Diarrhea Associated symptoms: no abdominal pain, no chills, no fever, no headaches and no vomiting         Teresa Galloway is a 67 y.o. female with past medical history of hypertension, IBS migraine headaches, generalized anxiety disorder, and history of brain and descending thoracic aortic aneurysm who presents to the Emergency Department complaining of watery diarrhea x 2 weeks.  She states that diarrhea is the typical component for her for her IBS, but states the diarrhea has been more frequent and significant recently.  She is concerned that she feels like she is dehydrated.  Also describes having generalized weakness.  No abdominal pain, fever, chills, nausea or vomiting.  No syncope or headache.  No black or bloody stools.  Typically takes Lomotil for her diarrhea without improvement.  Denies any chest pain or shortness of breath    Home Medications Prior to Admission medications   Medication Sig Start Date End Date Taking? Authorizing Provider  ALPRAZolam  (XANAX ) 0.25 MG tablet Take 0.25 mg by mouth 4 (four) times daily as needed for anxiety.     [provider]  aspirin  EC 81 MG tablet Take 81 mg by mouth daily. Swallow whole.    [provider]  atorvastatin  (LIPITOR) 40 MG tablet Take 1 tablet (40 mg total) by mouth daily. 12/29/20   Skeet Juliene SAUNDERS, DO  azelastine  (ASTELIN ) 0.1 % nasal spray Place 1 spray into both  nostrils 2 (two) times daily. Use in each nostril as directed 05/20/22   Tobie Arleta SQUIBB, MD  carboxymethylcellulose (REFRESH PLUS) 0.5 % SOLN Place 1 drop into both eyes 3 (three) times daily as needed (dry eyes).    [provider]  dicyclomine  (BENTYL ) 10 MG capsule Take 1 capsule (10 mg total) by mouth 3 (three) times daily before meals. Patient taking differently: Take 10 mg by mouth as needed for spasms. 11/28/17   Setzer, Veva LITTIE, NP  DULoxetine  (CYMBALTA ) 60 MG capsule Take 60 mg by mouth 2 (two) times daily. 08/29/20   [provider]  EPIPEN  2-PAK 0.3 MG/0.3ML SOAJ injection Inject 0.3 mg into the muscle as needed for anaphylaxis. 05/20/22   Tobie Arleta SQUIBB, MD  Eyelid Cleansers (AVENOVA) 0.01 % SOLN Apply 1 application topically 2 (two) times daily as needed (eyelid cleanser).    [provider]  fluticasone  (FLONASE ) 50 MCG/ACT nasal spray Place 2 sprays into both nostrils daily. 05/20/22   Tobie Arleta SQUIBB, MD  levothyroxine  (SYNTHROID ) 50 MCG tablet Take 50 mcg by mouth daily. 01/25/23   [provider]  pantoprazole  (PROTONIX ) 40 MG tablet Take 40 mg by mouth daily. 11/18/22   [provider]  pregabalin  (LYRICA ) 150 MG capsule Take 1 capsule (150 mg total) by mouth 2 (two) times daily. 05/07/23   Skeet Juliene SAUNDERS, DO  propranolol  (INDERAL ) 60 MG tablet Take 60 mg by mouth  daily.    [provider]  tiZANidine (ZANAFLEX) 4 MG tablet Take 4 mg by mouth 2 (two) times daily as needed. 09/26/22   [provider]  Ubrogepant  (UBRELVY ) 100 MG TABS Take 1 tablet (100 mg total) by mouth as needed (take 1 tab at the earlist onset of a Migraine, May repeat in 2 hour. Max 2 tabs in 24 hours). 02/25/23   Skeet, Adam R, DO  valsartan (DIOVAN) 80 MG tablet Take 80 mg by mouth daily. 06/15/20   [provider]      Allergies    Other, Sulfa antibiotics, Sunscreens, Lamictal [lamotrigine], Seroquel [quetiapine], and Tape    Review of Systems    Review of Systems  Constitutional:  Positive for appetite change. Negative for chills and fever.  Respiratory:  Negative for cough and shortness of breath.   Cardiovascular:  Negative for chest pain.  Gastrointestinal:  Positive for diarrhea. Negative for abdominal pain, blood in stool, nausea and vomiting.  Genitourinary:  Negative for dysuria and flank pain.  Musculoskeletal:  Negative for back pain.  Neurological:  Positive for weakness (Generalized). Negative for headaches.    Physical Exam Updated Vital Signs BP 121/63   Pulse 76   Temp 98.8 F (37.1 C) (Oral)   Resp 20   SpO2 100%  Physical Exam Vitals and nursing note reviewed.  Constitutional:      General: She is not in acute distress.    Appearance: Normal appearance. She is not toxic-appearing.  HENT:     Mouth/Throat:     Mouth: Mucous membranes are dry.  Cardiovascular:     Rate and Rhythm: Normal rate and regular rhythm.     Pulses: Normal pulses.  Pulmonary:     Effort: Pulmonary effort is normal. No respiratory distress.  Chest:     Chest wall: No tenderness.  Abdominal:     General: There is no distension.     Palpations: Abdomen is soft.     Tenderness: There is no abdominal tenderness. There is no guarding or rebound.  Skin:    General: Skin is warm.     Capillary Refill: Capillary refill takes less than 2 seconds.  Neurological:     General: No focal deficit present.     Mental Status: She is alert.     Sensory: No sensory deficit.     Motor: No weakness.     ED Results / Procedures / Treatments   Labs (all labs ordered are listed, but only abnormal results are displayed) Labs Reviewed  COMPREHENSIVE METABOLIC PANEL - Abnormal; Notable for the following components:      Result Value   Chloride 97 (*)    All other components within normal limits  LIPASE, BLOOD  CBC  URINALYSIS, ROUTINE W REFLEX MICROSCOPIC    EKG None  Radiology No results found.  Procedures Procedures     Medications Ordered in ED Medications  sodium chloride  0.9 % bolus 1,000 mL (0 mLs Intravenous Stopped 05/16/23 1437)  iohexol  (OMNIPAQUE ) 300 MG/ML solution 75 mL (75 mLs Intravenous Contrast Given 05/16/23 1330)    ED Course/ Medical Decision Making/ A&P                                 Medical Decision Making Patient here with history of IBS with diarrhea component.  Typically takes Lomotil for her symptoms but no relief and endorses increased diarrhea x 2 weeks.  No black or bloody stools.  She denies any fever or recent antibiotics.  On exam, patient well-appearing nontoxic.  Her vital signs are reassuring.  Mucous membranes are mildly dry.  She is concerned about dehydration.  Differential would include but not limited to IBS flare, C. difficile infection, acute surgical abdomen also considered but given absence of fever vomiting and abdominal pain this is felt to be less likely.  Will check labs, CT abdomen and pelvis and C. difficile panel  Amount and/or Complexity of Data Reviewed Labs: ordered.    Details: Labs interpreted by me, no evidence of leukocytosis, chemistries without derangement, urinalysis without evidence of infection Radiology: ordered.    Details: CT abdomen and pelvis ordered for further evaluation of patient's symptoms.  CT abdomen and pelvis without acute intra-abdominal or intrapelvic finding.  Patient is noted to have a descending aortic aneurysm of 3.5 cm.  She has documented history of this within similar size range Discussion of management or test interpretation with external provider(s):   Patient has been given IV fluids here, ambulatory in the department with steady gait.  Vital signs are reassuring.  She has been observed here for some time without diarrhea.  She has been unable to provide stool sample.  CT imaging and labs are both reassuring.  I suspect diarrhea secondary to IBS flare.  Patient does have concern regarding her aneurysm.  I review of  previous medical records aneurysm appears to be stable in size.  Patient has been seen by CT surgery in the past and aneurysm appears to be currently followed by neurology.  She is also seen by the [facility] system.  I feel that she is appropriate for discharge home at this time, she will continue her current regimen for her diarrhea and IBS.  She will follow-up closely outpatient with PCP and/or GI.  Risk Prescription drug management.           Final Clinical Impression(s) / ED Diagnoses Final diagnoses:  Diarrhea, unspecified type    Rx / DC Orders ED Discharge Orders     None         Herlinda Milling, PA-C 05/19/23 1214    Suzette Pac, MD 05/21/23 1344

## 2023-05-19 NOTE — Telephone Encounter (Signed)
 Faxed results to [phone]

## 2023-05-21 DIAGNOSIS — J42 Unspecified chronic bronchitis: Secondary | ICD-10-CM | POA: Diagnosis not present

## 2023-05-21 DIAGNOSIS — F339 Major depressive disorder, recurrent, unspecified: Secondary | ICD-10-CM | POA: Diagnosis not present

## 2023-05-21 DIAGNOSIS — Z299 Encounter for prophylactic measures, unspecified: Secondary | ICD-10-CM | POA: Diagnosis not present

## 2023-05-21 DIAGNOSIS — D692 Other nonthrombocytopenic purpura: Secondary | ICD-10-CM | POA: Diagnosis not present

## 2023-05-21 DIAGNOSIS — I719 Aortic aneurysm of unspecified site, without rupture: Secondary | ICD-10-CM | POA: Diagnosis not present

## 2023-05-21 DIAGNOSIS — I10 Essential (primary) hypertension: Secondary | ICD-10-CM | POA: Diagnosis not present

## 2023-05-26 DIAGNOSIS — I77819 Aortic ectasia, unspecified site: Secondary | ICD-10-CM | POA: Diagnosis not present

## 2023-05-26 DIAGNOSIS — R079 Chest pain, unspecified: Secondary | ICD-10-CM | POA: Diagnosis not present

## 2023-05-26 DIAGNOSIS — Z299 Encounter for prophylactic measures, unspecified: Secondary | ICD-10-CM | POA: Diagnosis not present

## 2023-05-26 DIAGNOSIS — F319 Bipolar disorder, unspecified: Secondary | ICD-10-CM | POA: Diagnosis not present

## 2023-05-26 DIAGNOSIS — K219 Gastro-esophageal reflux disease without esophagitis: Secondary | ICD-10-CM | POA: Diagnosis not present

## 2023-05-26 DIAGNOSIS — I10 Essential (primary) hypertension: Secondary | ICD-10-CM | POA: Diagnosis not present

## 2023-05-30 ENCOUNTER — Other Ambulatory Visit (HOSPITAL_COMMUNITY): Payer: Self-pay

## 2023-05-30 ENCOUNTER — Telehealth: Payer: Self-pay | Admitting: Pharmacy Technician

## 2023-05-30 NOTE — Telephone Encounter (Signed)
 Pharmacy Patient Advocate Encounter   Received notification from Pt Calls Messages that prior authorization for Ubrelvy 100MG  tablets is required/requested.   Insurance verification completed.   The patient is insured through [facility] ADVANTAGE/RX ADVANCE .   Per test claim: PA required; PA submitted to above mentioned insurance via CoverMyMeds Key/confirmation #/[id number] Status is pending

## 2023-05-30 NOTE — Telephone Encounter (Signed)
 PA request has been Submitted. New Encounter created for follow up. For additional info see Pharmacy Prior Auth telephone encounter from 05/30/2023.

## 2023-05-30 NOTE — Telephone Encounter (Signed)
 Pharmacy Patient Advocate Encounter  Received notification from [facility] ADVANTAGE/RX ADVANCE that Prior Authorization for Ubrelvy 100MG  tablets  has been APPROVED from 05/30/2023 to 08/27/2023. Ran test claim, Copay is $549.29. This test claim was processed through [facility]- copay amounts may vary at other pharmacies due to pharmacy/plan contracts, or as the patient moves through the different stages of their insurance plan.   PA #/Case ID/Reference #: [id number]

## 2023-06-02 DIAGNOSIS — R079 Chest pain, unspecified: Secondary | ICD-10-CM | POA: Diagnosis not present

## 2023-06-04 ENCOUNTER — Telehealth: Payer: Self-pay | Admitting: Cardiology

## 2023-06-04 ENCOUNTER — Ambulatory Visit: Payer: PPO | Attending: Cardiology | Admitting: Cardiology

## 2023-06-04 ENCOUNTER — Encounter: Payer: Self-pay | Admitting: Cardiology

## 2023-06-04 VITALS — BP 116/70 | HR 78 | Ht 66.0 in | Wt 138.0 lb

## 2023-06-04 DIAGNOSIS — E782 Mixed hyperlipidemia: Secondary | ICD-10-CM | POA: Diagnosis not present

## 2023-06-04 DIAGNOSIS — I10 Essential (primary) hypertension: Secondary | ICD-10-CM | POA: Diagnosis not present

## 2023-06-04 DIAGNOSIS — I7123 Aneurysm of the descending thoracic aorta, without rupture: Secondary | ICD-10-CM

## 2023-06-04 DIAGNOSIS — R079 Chest pain, unspecified: Secondary | ICD-10-CM | POA: Diagnosis not present

## 2023-06-04 NOTE — Patient Instructions (Signed)

## 2023-06-04 NOTE — Telephone Encounter (Signed)
 Teresa Galloway is wondering due to a brain aneurysm as well as aortic aneurysm she was told to not lift more than 10 lbs. They would like her to keep it under 8 lbs. Does she need stay within the suggested weight limit or is she okay to lift more than 10lbs?

## 2023-06-04 NOTE — Progress Notes (Signed)
 Clinical Summary Teresa Galloway is a 68 y.o.female seen today for follow up of the following medical problems.     1.Chest pain - isolated episode about 1.5 weeks ago - was in bed, awoke from sleep. Midchest/epigastric, sharp burning pain. 10/10 in severity. Strong urge to go to bathroom, had some diarrhea. Nauseous, starting vomiting. +headache, lightheaded. Went back to the bed.  - pain worst with deep breathing. Pain lasted about 30 minutes.  - better with rolaids - some DOE with activities, housework.    2. HTN - compliant with meds   3. HLD - labs followed at [facility]      4. Prior history of Pericarditis - seen in [place] 12/2013 with chest pain, clinically suspicious for pericarditis. CT at the time showed a moderate pericardial effusion, cardiac enzymes negative. EKG non-specific ST/T changes. ESR 42, ANA negative. Transferred to [facility] for management.   - echo LVEF 55-60%, moderate effusion, no tamponade physiology at that time - started on ibuprofen and colchicine for pericarditis with improved symptoms and eventual resolution of symptoms   - suffered recurrence of pericarditis, restarted on ibuprofen and colchicine. Completed 2 weeks of NSAIDs, 3 months of colchicine. Symptoms now resolved since last visit, remains on colchicine only. Unable to tolerate bid colchicine due to GI symptoms, taking just once daily.     5.Aortic aneurysm - 05/2022 stable 4 cm descending aneurysm.  05/2023 descending 3.5 cm.    6. Brain aneurysm - prior stenting by Dr Corliss Skains  Past Medical History:  Diagnosis Date   Acute pericarditis 12/2013   Angio-edema    Bipolar II disorder    Possible diagnosis   Complication of anesthesia    Effects lasted for 3 days   Dyslipidemia    Generalized anxiety disorder with panic attacks    Hx of lithium toxicity    Hx of migraines    On propranolol.   Hypertension    Hypothyroid    Major depressive disorder 04/16/2013    Obstructive sleep apnea    Currently untreatred; oral device caused TMJ; unsuccessful with CPAP   Pericardial effusion 01/03/2014   Moderate without hemodynamic compromise.   Sinus tachycardia    Urticaria      Allergies  Allergen Reactions   Other Anaphylaxis, Swelling and Other (See Comments)    Walnuts or any nuts   Sulfa Antibiotics Shortness Of Breath and Other (See Comments)    Swelling of lips    Sunscreens Anaphylaxis, Hives and Other (See Comments)    Titanium and zinc oxides Eye swelling    Lamictal [Lamotrigine]     Weird Ticks    Seroquel [Quetiapine] Other (See Comments)    Pychosis (foggy, uncoordinated, slurred speech)   Tape Hives     Current Outpatient Medications  Medication Sig Dispense Refill   ALPRAZolam (XANAX) 0.25 MG tablet Take 0.25 mg by mouth 4 (four) times daily as needed for anxiety.      aspirin EC 81 MG tablet Take 81 mg by mouth daily. Swallow whole.     atorvastatin (LIPITOR) 40 MG tablet Take 1 tablet (40 mg total) by mouth daily. 30 tablet 1   azelastine (ASTELIN) 0.1 % nasal spray Place 1 spray into both nostrils 2 (two) times daily. Use in each nostril as directed 30 mL 5   carboxymethylcellulose (REFRESH PLUS) 0.5 % SOLN Place 1 drop into both eyes 3 (three) times daily as needed (dry eyes).     dicyclomine (BENTYL)  10 MG capsule Take 1 capsule (10 mg total) by mouth 3 (three) times daily before meals. (Patient taking differently: Take 10 mg by mouth as needed for spasms.) 90 capsule 4   DULoxetine (CYMBALTA) 60 MG capsule Take 60 mg by mouth 2 (two) times daily.     EPIPEN 2-PAK 0.3 MG/0.3ML SOAJ injection Inject 0.3 mg into the muscle as needed for anaphylaxis. 2 each 1   Eyelid Cleansers (AVENOVA) 0.01 % SOLN Apply 1 application topically 2 (two) times daily as needed (eyelid cleanser).     fluticasone (FLONASE) 50 MCG/ACT nasal spray Place 2 sprays into both nostrils daily. 16 g 5   levothyroxine (SYNTHROID) 50 MCG tablet Take 50 mcg by  mouth daily.     pantoprazole (PROTONIX) 40 MG tablet Take 40 mg by mouth daily.     pregabalin (LYRICA) 150 MG capsule Take 1 capsule (150 mg total) by mouth 2 (two) times daily. 60 capsule 5   propranolol (INDERAL) 60 MG tablet Take 60 mg by mouth daily.     tiZANidine (ZANAFLEX) 4 MG tablet Take 4 mg by mouth 2 (two) times daily as needed.     Ubrogepant (UBRELVY) 100 MG TABS Take 1 tablet (100 mg total) by mouth as needed (take 1 tab at the earlist onset of a Migraine, May repeat in 2 hour. Max 2 tabs in 24 hours). 16 tablet 5   valsartan (DIOVAN) 80 MG tablet Take 80 mg by mouth daily.     No current facility-administered medications for this visit.     Past Surgical History:  Procedure Laterality Date   BREAST SURGERY     CERVICAL FUSION     CHOLECYSTECTOMY     COLONOSCOPY WITH PROPOFOL N/A 01/16/2018   Procedure: COLONOSCOPY WITH PROPOFOL;  Surgeon: Malissa Hippo, MD;  Location: [facility];  Service: Endoscopy;  Laterality: N/A;  10:55   IR 3D INDEPENDENT WKST  12/13/2020   IR ANGIO INTRA EXTRACRAN SEL INTERNAL CAROTID BILAT MOD SED  12/13/2020   IR ANGIO VERTEBRAL SEL VERTEBRAL UNI L MOD SED  12/13/2020   IR CT HEAD LTD  12/13/2020   IR RADIOLOGIST EVAL & MGMT  11/30/2020   IR RADIOLOGIST EVAL & MGMT  01/10/2021   IR RADIOLOGIST EVAL & MGMT  05/14/2021   IR RADIOLOGIST EVAL & MGMT  05/01/2022   IR TRANSCATH/EMBOLIZ  12/13/2020   NASAL SINUS SURGERY     POLYPECTOMY  01/16/2018   Procedure: POLYPECTOMY;  Surgeon: Malissa Hippo, MD;  Location: [facility];  Service: Endoscopy;;  colon   RADIOLOGY WITH ANESTHESIA N/A 12/13/2020   Procedure: IR WITH ANESTHESIA EBOLIZATION;  Surgeon: Julieanne Cotton, MD;  Location: [facility] OR;  Service: Radiology;  Laterality: N/A;   VAGINAL HYSTERECTOMY       Allergies  Allergen Reactions   Other Anaphylaxis, Swelling and Other (See Comments)    Walnuts or any nuts   Sulfa Antibiotics Shortness Of Breath and Other (See Comments)    Swelling of  lips    Sunscreens Anaphylaxis, Hives and Other (See Comments)    Titanium and zinc oxides Eye swelling    Lamictal [Lamotrigine]     Weird Ticks    Seroquel [Quetiapine] Other (See Comments)    Pychosis (foggy, uncoordinated, slurred speech)   Tape Hives      Family History  Problem Relation Age of Onset   Depression Mother    Depression Father    Depression Sister    Anxiety disorder  Sister    Alcohol abuse Maternal Uncle    Alcohol abuse Paternal Uncle    Alcohol abuse Maternal Grandfather    Depression Maternal Grandmother      Social History Ms. Lampkins reports that she quit smoking about 10 years ago. Her smoking use included cigarettes. She started smoking about 48 years ago. She has a 37.2 pack-year smoking history. She has never used smokeless tobacco. Ms. Petraitis reports current alcohol use of about 3.0 standard drinks of alcohol per week.     Physical Examination Today's Vitals   06/04/23 1322  BP: 116/70  Pulse: 78  SpO2: 98%  Weight: 138 lb (62.6 kg)  Height: 5\' 6"  (1.676 m)   Body mass index is 22.27 kg/m.  Gen: resting comfortably, no acute distress HEENT: no scleral icterus, pupils equal round and reactive, no palptable cervical adenopathy,  CV: RRR, no m/rg, no jvd Resp: Clear to auscultation bilaterally GI: abdomen is soft, non-tender, non-distended, normal bowel sounds, no hepatosplenomegaly MSK: extremities are warm, no edema.  Skin: warm, no rash Neuro:  no focal deficits Psych: appropriate affect   Diagnostic Studies  01/02/14 Echo Study Conclusions  - Left ventricle: The cavity size was normal. Systolic function was normal. The estimated ejection fraction was in the range of 55% to 60%. Wall motion was normal; there were no regional wall motion abnormalities. - Pericardium, extracardiac: A moderate pericardial effusion was identified. There was no echocardiographic evidence for tamponade physiology.   02/2014 Echo Study  Conclusions  - Left ventricle: The cavity size was normal. Systolic function was   normal. The estimated ejection fraction was in the range of 60%   to 65%. Wall motion was normal; there were no regional wall   motion abnormalities. Doppler parameters are consistent with   abnormal left ventricular relaxation (grade 1 diastolic   dysfunction). - Pericardium, extracardiac: A moderate pericardial effusion was   identified circumferential to the heart. The fluid exhibited a   fibrinous appearance.There was no evidence of hemodynamic   compromise. There was no chamber collapse. Respirophasic change   in stroke volume was normal. Features were not consistent with   tamponade physiology.  Impressions:  - Compared to the prior study, there has been a slight increase in   pericardial effusion along the posterior wall.   03/2014 Echo Study Conclusions  - Procedure narrative: Transthoracic echocardiography. Image   quality was fair, with suboptimal valvular visualization. - Left ventricle: The cavity size appeared normal. Intracavitary   diameter and wall thickness were not obtained. Systolic function   was vigorous. The estimated ejection fraction was in the range of   65% to 70%. Wall motion was normal; there were no regional wall   motion abnormalities. - Pericardium, extracardiac: Not well visualized. There appeared to   be a trivial to small circumferential pericardial effusion. There   was no evidence of hemodynamic compromise, with no right atrial   or ventricular diastolic collapse. There was normal respiratory   variation of the IVC. There was suboptimal valvular visualization   for the assessment of inflow variation.   Assessment and Plan    1.Chest pain - isolated episode midchest/epitastric pain with diarrhea and N/V. - symptoms not consistent with cardiac etiology - workup with pcp with EKG and echo benign - no further testing planned - EKG today SR, no ischemic  changes  2. Descending aortic aneurysm - mild and stable, continue to monitor   3. HTN - at goal, continue current meds  4. HLD - request labs from [facility]       Antoine Poche, M.D.

## 2023-06-05 ENCOUNTER — Encounter: Payer: Self-pay | Admitting: Internal Medicine

## 2023-06-09 ENCOUNTER — Encounter: Payer: Self-pay | Admitting: Internal Medicine

## 2023-06-09 ENCOUNTER — Ambulatory Visit (INDEPENDENT_AMBULATORY_CARE_PROVIDER_SITE_OTHER): Payer: No Typology Code available for payment source | Admitting: Internal Medicine

## 2023-06-09 VITALS — BP 104/68 | HR 86 | Temp 96.5°F | Resp 16 | Wt 140.2 lb

## 2023-06-09 DIAGNOSIS — Z91018 Allergy to other foods: Secondary | ICD-10-CM

## 2023-06-09 DIAGNOSIS — J310 Chronic rhinitis: Secondary | ICD-10-CM

## 2023-06-09 DIAGNOSIS — L853 Xerosis cutis: Secondary | ICD-10-CM

## 2023-06-09 MED ORDER — FLUTICASONE PROPIONATE 50 MCG/ACT NA SUSP: 2.0000 | Freq: Every day | NASAL | 11 refills | Status: DC

## 2023-06-09 MED ORDER — AZELASTINE HCL 0.1 % NA SOLN
2.0000 | Freq: Two times a day (BID) | NASAL | 11 refills | Status: DC | PRN
Start: 2023-06-09 — End: 2023-11-24

## 2023-06-09 NOTE — Patient Instructions (Addendum)
 History of Food Allergy:  - Okay to eat treenuts.    Chronic Rhinitis: - Positive skin test to none 02/2022 - Use nasal saline rinses before nose sprays such as with Neilmed Sinus Rinse.  Use distilled water.   - Use Flonase 2 sprays each nostril daily. Aim upward and outward. - Use Azelastine 1-2 sprays each nostril twice daily as needed. Aim upward and outward.  Dry Skin:  - Do a daily soaking tub bath in warm water for 10-15 minutes.  - Use a gentle, unscented cleanser at the end of the bath (such as Dove unscented bar or baby wash, or Aveeno sensitive body wash). Then rinse, pat half-way dry, and apply a gentle, unscented moisturizer cream or ointment (Cerave, Cetaphil, Eucerin, Aveeno, Aquaphor, Vanicream, Vaseline)  all over while still damp. Use this multiple times a day.  - Use cotton gloves to help soak in the moisturizer.

## 2023-06-09 NOTE — Telephone Encounter (Signed)
Spoke to patient who verbalized understanding. Patient had no further questions at this time.

## 2023-06-09 NOTE — Progress Notes (Signed)
   FOLLOW UP Date of Service/Encounter:  06/09/23   Subjective:  Teresa Galloway (DOB: Jan 22, 1956) is a 68 y.o. female who [facility] for follow up for chronic rhinitis, food allergies.   History obtained from: chart review and patient. Last seen on 12/16/2022 and underwent oral challenge to walnut and passed.  Since last visit, reports she is eating walnuts and even started eating pecans, pistachio, coconut at home without any issues.  Does note lots of runny nose the past few months.  Using nose sprays PRN.  Also notes significantly dry itchy skin, worse on hands.  This is always a problem for her in the winter.   Past Medical History: Past Medical History:  Diagnosis Date   Acute pericarditis 12/2013   Angio-edema    Bipolar II disorder    Possible diagnosis   Complication of anesthesia    Effects lasted for 3 days   Dyslipidemia    Generalized anxiety disorder with panic attacks    Hx of lithium toxicity    Hx of migraines    On propranolol.   Hypertension    Hypothyroid    Major depressive disorder 04/16/2013   Obstructive sleep apnea    Currently untreatred; oral device caused TMJ; unsuccessful with CPAP   Pericardial effusion 01/03/2014   Moderate without hemodynamic compromise.   Sinus tachycardia    Urticaria     Objective:  BP 104/68   Pulse 86   Temp (!) 96.5 F (35.8 C)   Resp 16   Wt 140 lb 4 oz (63.6 kg)   SpO2 97%   BMI 22.64 kg/m  Body mass index is 22.64 kg/m. Physical Exam: GEN: alert, well developed HEENT: clear conjunctiva, nose without inferior turbinate hypertrophy, pink nasal mucosa, slight clear rhinorrhea, + cobblestoning HEART: regular rate and rhythm, no murmur LUNGS: clear to auscultation bilaterally, no coughing, unlabored respiration SKIN: no rashes or lesions  Assessment:   No diagnosis found.  Plan/Recommendations:  History of Food Allergy:  - okay to eat treenuts.  - Initial rxn:  pecan caused lip bumps and throat itching. Walnuts caused hives, facial swelling and also some trouble breathing.   - 02/2022 SPT slight reactivity to pistachio but other treenuts and coconut were negative. sIgE to treenuts and coconut all negative 02/2022.    - Oral challenge 12/2022: passed to walnut. Tolerating pecan/pistachio/coconut at home.     Chronic Rhinitis: - Positive skin test to none 02/2022 - Use nasal saline rinses before nose sprays such as with Neilmed Sinus Rinse.  Use distilled water.   - Use Flonase 2 sprays each nostril daily. Aim upward and outward. - Use Azelastine 1-2 sprays each nostril twice daily as needed. Aim upward and outward.  Dry Skin:  - Do a daily soaking tub bath in warm water for 10-15 minutes.  - Use a gentle, unscented cleanser at the end of the bath (such as Dove unscented bar or baby wash, or Aveeno sensitive body wash). Then rinse, pat half-way dry, and apply a gentle, unscented moisturizer cream or ointment (Cerave, Cetaphil, Eucerin, Aveeno, Aquaphor, Vanicream, Vaseline)  all over while still damp. Use this multiple times a day.  - Use cotton gloves to help soak in the moisturizer.    Return if symptoms worsen or fail to improve.  Alesia Morin, MD [facility]

## 2023-06-12 ENCOUNTER — Encounter: Payer: Self-pay | Admitting: *Deleted

## 2023-07-01 DIAGNOSIS — M2550 Pain in unspecified joint: Secondary | ICD-10-CM | POA: Diagnosis not present

## 2023-07-01 DIAGNOSIS — M79643 Pain in unspecified hand: Secondary | ICD-10-CM | POA: Diagnosis not present

## 2023-07-01 DIAGNOSIS — I10 Essential (primary) hypertension: Secondary | ICD-10-CM | POA: Diagnosis not present

## 2023-07-01 DIAGNOSIS — R5383 Other fatigue: Secondary | ICD-10-CM | POA: Diagnosis not present

## 2023-07-01 DIAGNOSIS — Z299 Encounter for prophylactic measures, unspecified: Secondary | ICD-10-CM | POA: Diagnosis not present

## 2023-07-10 DIAGNOSIS — J029 Acute pharyngitis, unspecified: Secondary | ICD-10-CM | POA: Diagnosis not present

## 2023-07-10 DIAGNOSIS — R070 Pain in throat: Secondary | ICD-10-CM | POA: Diagnosis not present

## 2023-07-10 DIAGNOSIS — J302 Other seasonal allergic rhinitis: Secondary | ICD-10-CM | POA: Diagnosis not present

## 2023-07-10 DIAGNOSIS — Z299 Encounter for prophylactic measures, unspecified: Secondary | ICD-10-CM | POA: Diagnosis not present

## 2023-07-15 DIAGNOSIS — J302 Other seasonal allergic rhinitis: Secondary | ICD-10-CM | POA: Diagnosis not present

## 2023-07-15 DIAGNOSIS — J029 Acute pharyngitis, unspecified: Secondary | ICD-10-CM | POA: Diagnosis not present

## 2023-07-15 DIAGNOSIS — Z6822 Body mass index (BMI) 22.0-22.9, adult: Secondary | ICD-10-CM | POA: Diagnosis not present

## 2023-07-15 DIAGNOSIS — Z299 Encounter for prophylactic measures, unspecified: Secondary | ICD-10-CM | POA: Diagnosis not present

## 2023-07-15 DIAGNOSIS — I10 Essential (primary) hypertension: Secondary | ICD-10-CM | POA: Diagnosis not present

## 2023-07-21 DIAGNOSIS — J040 Acute laryngitis: Secondary | ICD-10-CM | POA: Diagnosis not present

## 2023-07-21 DIAGNOSIS — R070 Pain in throat: Secondary | ICD-10-CM | POA: Diagnosis not present

## 2023-07-21 DIAGNOSIS — Z299 Encounter for prophylactic measures, unspecified: Secondary | ICD-10-CM | POA: Diagnosis not present

## 2023-07-21 DIAGNOSIS — R42 Dizziness and giddiness: Secondary | ICD-10-CM | POA: Diagnosis not present

## 2023-08-05 DIAGNOSIS — Z299 Encounter for prophylactic measures, unspecified: Secondary | ICD-10-CM | POA: Diagnosis not present

## 2023-08-05 DIAGNOSIS — Z1331 Encounter for screening for depression: Secondary | ICD-10-CM | POA: Diagnosis not present

## 2023-08-05 DIAGNOSIS — Z7189 Other specified counseling: Secondary | ICD-10-CM | POA: Diagnosis not present

## 2023-08-05 DIAGNOSIS — F339 Major depressive disorder, recurrent, unspecified: Secondary | ICD-10-CM | POA: Diagnosis not present

## 2023-08-05 DIAGNOSIS — I77819 Aortic ectasia, unspecified site: Secondary | ICD-10-CM | POA: Diagnosis not present

## 2023-08-05 DIAGNOSIS — F319 Bipolar disorder, unspecified: Secondary | ICD-10-CM | POA: Diagnosis not present

## 2023-08-05 DIAGNOSIS — I10 Essential (primary) hypertension: Secondary | ICD-10-CM | POA: Diagnosis not present

## 2023-08-05 DIAGNOSIS — Z0000 Encounter for general adult medical examination without abnormal findings: Secondary | ICD-10-CM | POA: Diagnosis not present

## 2023-08-05 DIAGNOSIS — Z1339 Encounter for screening examination for other mental health and behavioral disorders: Secondary | ICD-10-CM | POA: Diagnosis not present

## 2023-08-25 NOTE — Progress Notes (Deleted)
 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 Migraine without aura, without status migrainosus, not intractable Restless leg syndrome Memory deficits New paroxysmal left parietal headache     Migraines:  Ubrelvy  100mg  as needed *** RLS:  Lyrica  150mg  twice daily *** Memory:  Repeat neuropsychological evaluation scheduled and pending ASA 81mg  daily; secondary stroke prevention as managed by PCP Follow up ***    Subjective:  Teresa Galloway is a 68 year old female with IBS, thoracic aortic aneurysm, migraine, depression, anxiety, Bipolar disorder and OSA who follows up for headache, RLS and memory deficits.  MRA of head personally reviewed.   UPDATE: New Paroxysmal Left Parietal Headaches:   She notes new headaches which started in 2024.  Different than the left stabbing parietal headache, but in the same location.  It is a brief non-throbbing localized pain lasting 10-15 seconds and recurring throughout day.  Not everyday.  Repeat MRA of head on 05/05/2023 revealed stable appearance of left MCA stent with no evidence of residual aneurysm filling.    Migraines: She is doing well.  She only used Ubrelvy  once or twice in past 6 months.    RLS:  LFTs normalized and now  Lyrica  150mg  BID.  Controlled.   . Memory: Memory:  She notes memory is worse.  Difficult to focus.  Loses train of thought.  Word finding issues.  Repeat neuropsychological evaluation has been scheduled and pending.     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  History of Lithium toxicity in 2020, presenting with memory deficits/word-finding difficulty, profuse sweating, left arm pain and GI issues.  She stopped Lithium.  However, she continues to have profuse  sweating, tingling itch across the face and hands (like a feather running across her skin.  She still has tingling and aching pain radiating down the left arm from shoulder down to the entire hand. No weakness.  No neck pain.  She does have history of cervical  C5-C6-C7 spine fusion 10 years ago.  01/28/2019 NCV-EMG:  normal.  02/19/2019 MRI Cervical Spine Wo:  ACDF C5-6 and C6-7 without stenosis; 2 mm anterolisthesis with central disc protrusion, uncinate spurring and moderate facet degeneration at C4-5 causing mild spinal stenosis and moderate foraminal stenosis bilaterally.  She started seeing a chiropractor which has been helpful.  If she turns her head to the left, she feels the pain and numbness down the left shoulder and to the fingertips.     Restless Leg Syndrome: She has restless leg syndrome which lasts all day.  12/30/2018 LABS:  Ferritin 65, TSH 1.00, B12 >2,000.   Migraine Without Aura: Severe diffuse pressure headache with nausea, photophobia and phonophobia.  Occurs infrequently   Possible Left-sided V1 Trigeminal Neuralgia and Right-sided Facial Numbness. In 2021, she developed a twitch in her left eye.  She later developed a paroxysmal stabbing pain starting on top left parietal region radiating down to left eye.  It would last a couple of seconds.  It occurs several times a day (6 to 20 times a day).  Some left sided facial numbness.  No associated facial numbness, facial weakness or autonomic symptoms involving the eye.  No triggers.  She subsequently developed right-sided facial numbness.     Stroke: Due to headaches and left facial numbness, she had  an MRI and MRA of the brain on 11/25/2020 which revealed mild-moderate chronic small vessel ischemic changes and remote small left cerebellar infarct as well as a 4 x 4 x 2 mm left MCA bifurcation aneurysm.  On 12/13/2020, she underwent endovascular treatment for the aneurysm.  Following the procedure, it was noted that she exhibited right  sided weakness, vision loss, some word-finding difficulty with paraphasic errors and slight hesitancy of speech.  No unilateral numbness or weakness.  MRI of brain showed scattered acute infarcts within the left MCA distribution.  MRA of head showed sequelae of interval endovascular treatment of the aneurysm with pipeline flow diverter stent without any hemodynamically significant stenosis.  Echocardiogram showed EF 60-65% but intraatrial septum was not well-visualized.  Lower extremity venous ultrasound negative for DVT.  LDL was 101, Hgb A1c 5.2 and SARS Coronavirus 2 negative.  She was already taking ASA 81mg  on day of admission.  She was discharged on ASA 81mg  and Plavix  75mg  and started on atorvastatin  40mg .  Due to heavy and large bruising/hematomas, Plavix  was reduced to 37.5mg  daily while she was in rehab.  She cannot remember much during her 3 days in the hospital.  She reports visual aura described as sparkles and pinwheels in her vision.  She reports weakness of her right ankle.  She was admitted to [facility] for suspected TIA.  She felt dizzy and massed out.  No lateralizing symptoms.  CTA head and neck revealed postsurgical changes secondary to pipeline stent of MCA bifurcation aneurysm  with unchanged persistent filling of treated aneurysm measuring 4 x 3 mm.  No emergent large vessel occlusion.  MRI of brain showed showed no acute infarct or hemorrhage.  She was observed overnight    She had follow up MRI/MRA of head on 05/02/2021 personally reviewed which still revealed 4 x 3 mm left MCA bifurcation aneurysm, confirmed (3.3-3.6 mm) on cerebral arteriogram on 05/14/2021.  Advised by Dr. Alvira Josephs to take ASA 81mg  daily and Plavix  37.5mg  every other day (P2Y12 level was 86).  Repeat MRI/MRA of brain on 10/22/2021 was stable with no acute findings and no flow related signal within the treated left MCA aneurysm.  Repeat MRA of head on 04/25/2022 was stable with patent stent within the distal  left MCA and no residual aneurysm.  She was advised by Dr. Alvira Josephs to discontinue Plavix  but to continue ASA 81mg  daily.   Subjective Memory Deficits: She continued to have short-term memory deficits.  12/30/2018 LABS:  TSH 1.00, B12 >2,000.  She underwent neuropsychological testing with Dr. Kitty Perkins on 03/25/2019, which did not demonstrate evidence of neurocognitive deficits.  Symptoms were thought to be possibly related to severe depression and anxiety and pharmacologic effect.      Past medications:  lithium; gabapentin (muscle jerks), ropinrole (lost efficacy), Cymbalta , melatonin (she feels "wired"), indomethacin  (side effects, ineffective), pramipexole  (discontinued due to augmentation), rizatriptan , baclofen   PAST MEDICAL HISTORY: Past Medical History:  Diagnosis Date   Acute pericarditis 12/2013   Angio-edema    Bipolar II disorder    Possible diagnosis   Complication of anesthesia    Effects lasted for 3 days   Dyslipidemia    Generalized anxiety disorder with panic attacks    Hx of lithium toxicity    Hx of migraines    On propranolol .   Hypertension    Hypothyroid    Major depressive disorder 04/16/2013   Obstructive sleep apnea    Currently untreatred; oral device caused TMJ; unsuccessful with CPAP  Pericardial effusion 01/03/2014   Moderate without hemodynamic compromise.   Sinus tachycardia    Urticaria     MEDICATIONS: Current Outpatient Medications on File Prior to Visit  Medication Sig Dispense Refill   ALPRAZolam  (XANAX ) 0.25 MG tablet Take 0.25 mg by mouth 4 (four) times daily as needed for anxiety.      aspirin  EC 81 MG tablet Take 81 mg by mouth daily. Swallow whole.     atorvastatin  (LIPITOR) 40 MG tablet Take 1 tablet (40 mg total) by mouth daily. 30 tablet 1   azelastine  (ASTELIN ) 0.1 % nasal spray Place 2 sprays into both nostrils 2 (two) times daily as needed. Use in each nostril as directed 30 mL 11   dicyclomine  (BENTYL ) 10 MG capsule Take 1 capsule  (10 mg total) by mouth 3 (three) times daily before meals. (Patient taking differently: Take 10 mg by mouth as needed for spasms.) 90 capsule 4   DULoxetine  (CYMBALTA ) 60 MG capsule Take 60 mg by mouth 2 (two) times daily.     Eyelid Cleansers (AVENOVA) 0.01 % SOLN Apply 1 application topically 2 (two) times daily as needed (eyelid cleanser). (Patient not taking: Reported on 06/09/2023)     fluticasone  (FLONASE ) 50 MCG/ACT nasal spray Place 2 sprays into both nostrils daily. 16 g 11   levothyroxine  (SYNTHROID ) 50 MCG tablet Take 50 mcg by mouth daily.     pantoprazole  (PROTONIX ) 40 MG tablet Take 40 mg by mouth daily.     pregabalin  (LYRICA ) 150 MG capsule Take 1 capsule (150 mg total) by mouth 2 (two) times daily. 60 capsule 5   propranolol  (INDERAL ) 60 MG tablet Take 60 mg by mouth daily.     tiZANidine (ZANAFLEX) 4 MG tablet Take 4 mg by mouth 2 (two) times daily as needed.     Ubrogepant  (UBRELVY ) 100 MG TABS Take 1 tablet (100 mg total) by mouth as needed (take 1 tab at the earlist onset of a Migraine, May repeat in 2 hour. Max 2 tabs in 24 hours). 16 tablet 5   valsartan (DIOVAN) 80 MG tablet Take 80 mg by mouth daily.     No current facility-administered medications on file prior to visit.    ALLERGIES: Allergies  Allergen Reactions   Other Anaphylaxis, Swelling and Other (See Comments)    Walnuts or any nuts   Sulfa Antibiotics Shortness Of Breath and Other (See Comments)    Swelling of lips    Sunscreens Anaphylaxis, Hives and Other (See Comments)    Titanium and zinc oxides Eye swelling    Lamictal [Lamotrigine]     Weird Ticks    Seroquel [Quetiapine] Other (See Comments)    Pychosis (foggy, uncoordinated, slurred speech)   Tape Hives    FAMILY HISTORY: Family History  Problem Relation Age of Onset   Depression Mother    Heart attack Mother    Depression Father    Depression Sister    Anxiety disorder Sister    Depression Maternal Grandmother    Stroke Maternal  Grandmother    Heart attack Maternal Grandmother    Alcohol  abuse Maternal Grandfather    Heart attack Paternal Grandfather    Stroke Paternal Grandfather    Alcohol  abuse Maternal Uncle    Alcohol  abuse Paternal Uncle       Objective:  *** General: No acute distress.  Patient appears well-groomed.   ***   Janne Members, DO  CC: Alvia Awkward, MD

## 2023-08-26 ENCOUNTER — Ambulatory Visit: Payer: PPO | Admitting: Neurology

## 2023-09-04 ENCOUNTER — Ambulatory Visit: Payer: PPO | Admitting: Cardiology

## 2023-09-29 DIAGNOSIS — J020 Streptococcal pharyngitis: Secondary | ICD-10-CM | POA: Diagnosis not present

## 2023-09-29 DIAGNOSIS — Z299 Encounter for prophylactic measures, unspecified: Secondary | ICD-10-CM | POA: Diagnosis not present

## 2023-09-29 DIAGNOSIS — R5383 Other fatigue: Secondary | ICD-10-CM | POA: Diagnosis not present

## 2023-09-29 DIAGNOSIS — J029 Acute pharyngitis, unspecified: Secondary | ICD-10-CM | POA: Diagnosis not present

## 2023-09-29 DIAGNOSIS — I10 Essential (primary) hypertension: Secondary | ICD-10-CM | POA: Diagnosis not present

## 2023-09-30 ENCOUNTER — Telehealth (HOSPITAL_COMMUNITY): Payer: Self-pay

## 2023-09-30 NOTE — Telephone Encounter (Signed)
 Received a request from [facility] for pt's records from aneurysm embo w/Dr. Dolphus. Reports printed and faxed on 6/24 at 1236 to [phone]. AB

## 2023-10-04 ENCOUNTER — Encounter (HOSPITAL_COMMUNITY): Payer: Self-pay | Admitting: Interventional Radiology

## 2023-11-06 ENCOUNTER — Ambulatory Visit (HOSPITAL_COMMUNITY)
Admission: RE | Admit: 2023-11-06 | Discharge: 2023-11-06 | Disposition: A | Discharge status: Home / Self Care | Source: Ambulatory Visit | Attending: Internal Medicine | Admitting: Internal Medicine

## 2023-11-06 ENCOUNTER — Other Ambulatory Visit (HOSPITAL_COMMUNITY): Payer: Self-pay | Admitting: Internal Medicine

## 2023-11-06 DIAGNOSIS — R519 Headache, unspecified: Secondary | ICD-10-CM | POA: Diagnosis not present

## 2023-11-06 DIAGNOSIS — I6782 Cerebral ischemia: Secondary | ICD-10-CM | POA: Diagnosis not present

## 2023-11-06 DIAGNOSIS — Z299 Encounter for prophylactic measures, unspecified: Secondary | ICD-10-CM | POA: Diagnosis not present

## 2023-11-06 DIAGNOSIS — R110 Nausea: Secondary | ICD-10-CM | POA: Diagnosis not present

## 2023-11-06 DIAGNOSIS — I10 Essential (primary) hypertension: Secondary | ICD-10-CM | POA: Diagnosis not present

## 2023-11-06 DIAGNOSIS — R52 Pain, unspecified: Secondary | ICD-10-CM | POA: Diagnosis not present

## 2023-11-07 DIAGNOSIS — Z299 Encounter for prophylactic measures, unspecified: Secondary | ICD-10-CM | POA: Diagnosis not present

## 2023-11-07 DIAGNOSIS — R42 Dizziness and giddiness: Secondary | ICD-10-CM | POA: Diagnosis not present

## 2023-11-07 DIAGNOSIS — I10 Essential (primary) hypertension: Secondary | ICD-10-CM | POA: Diagnosis not present

## 2023-11-17 DIAGNOSIS — Z0000 Encounter for general adult medical examination without abnormal findings: Secondary | ICD-10-CM | POA: Diagnosis not present

## 2023-11-17 DIAGNOSIS — E7800 Pure hypercholesterolemia, unspecified: Secondary | ICD-10-CM | POA: Diagnosis not present

## 2023-11-17 DIAGNOSIS — Z79899 Other long term (current) drug therapy: Secondary | ICD-10-CM | POA: Diagnosis not present

## 2023-11-17 DIAGNOSIS — Z299 Encounter for prophylactic measures, unspecified: Secondary | ICD-10-CM | POA: Diagnosis not present

## 2023-11-17 DIAGNOSIS — E039 Hypothyroidism, unspecified: Secondary | ICD-10-CM | POA: Diagnosis not present

## 2023-11-17 DIAGNOSIS — I10 Essential (primary) hypertension: Secondary | ICD-10-CM | POA: Diagnosis not present

## 2023-11-18 ENCOUNTER — Other Ambulatory Visit: Payer: Self-pay | Admitting: Neurology

## 2023-11-18 MED ORDER — PREGABALIN 150 MG PO CAPS: 150.0000 mg | ORAL_CAPSULE | Freq: Two times a day (BID) | ORAL | 2 refills | Status: AC

## 2023-11-18 NOTE — Telephone Encounter (Signed)
 Pt needs a refill on   Pregabalin  150 mg  called into the [facility] in [place]

## 2023-11-19 DIAGNOSIS — E039 Hypothyroidism, unspecified: Secondary | ICD-10-CM | POA: Diagnosis not present

## 2023-11-19 DIAGNOSIS — Z79899 Other long term (current) drug therapy: Secondary | ICD-10-CM | POA: Diagnosis not present

## 2023-11-19 DIAGNOSIS — R5383 Other fatigue: Secondary | ICD-10-CM | POA: Diagnosis not present

## 2023-11-19 DIAGNOSIS — E7800 Pure hypercholesterolemia, unspecified: Secondary | ICD-10-CM | POA: Diagnosis not present

## 2023-11-24 ENCOUNTER — Ambulatory Visit: Attending: Cardiology | Admitting: Cardiology

## 2023-11-24 ENCOUNTER — Encounter: Payer: Self-pay | Admitting: Cardiology

## 2023-11-24 VITALS — BP 114/68 | HR 83 | Ht 66.0 in | Wt 143.0 lb

## 2023-11-24 DIAGNOSIS — I7123 Aneurysm of the descending thoracic aorta, without rupture: Secondary | ICD-10-CM | POA: Diagnosis not present

## 2023-11-24 DIAGNOSIS — I10 Essential (primary) hypertension: Secondary | ICD-10-CM | POA: Diagnosis not present

## 2023-11-24 NOTE — Patient Instructions (Signed)
 Medication Instructions:   Continue all current medications.     Labwork:  none  Testing/Procedures:  none  Follow-Up:  6 months   Any Other Special Instructions Will Be Listed Below (If Applicable).  Please keep BP log x 2 weeks & return to office for provider review.   If you need a refill on your cardiac medications before your next appointment, please call your pharmacy.

## 2023-11-24 NOTE — Progress Notes (Signed)
 Clinical Summary Teresa Galloway is a 68 y.o.female seen today for follow up of the following medical problems.     1. HTN - she is compliant with meds - reports some SBPs to 140s at time when she checks, last several clinic visits 110s/60s to 70s     2. HLD - labs followed at [facility]         3. Prior history of Pericarditis - seen in [place] 12/2013 with chest pain, clinically suspicious for pericarditis. CT at the time showed a moderate pericardial effusion, cardiac enzymes negative. EKG non-specific ST/T changes. ESR 42, ANA negative. Transferred to [facility] for management.   - echo LVEF 55-60%, moderate effusion, no tamponade physiology at that time - started on ibuprofen  and colchicine  for pericarditis with improved symptoms and eventual resolution of symptoms   - suffered recurrence of pericarditis, restarted on ibuprofen  and colchicine . Completed 2 weeks of NSAIDs, 3 months of colchicine .      4.Aortic aneurysm - 05/2022 stable 4 cm descending aneurysm.  05/2023 descending 3.5 cm.      5. Brain aneurysm - prior stenting by Dr Dolphus  6,Migraine - on propranolol   7. Palpitations - doing well, limiting caffeine.  Past Medical History:  Diagnosis Date   Acute pericarditis 12/2013   Angio-edema    Bipolar II disorder    Possible diagnosis   Complication of anesthesia    Effects lasted for 3 days   Dyslipidemia    Generalized anxiety disorder with panic attacks    Hx of lithium toxicity    Hx of migraines    On propranolol .   Hypertension    Hypothyroid    Major depressive disorder 04/16/2013   Obstructive sleep apnea    Currently untreatred; oral device caused TMJ; unsuccessful with CPAP   Pericardial effusion 01/03/2014   Moderate without hemodynamic compromise.   Sinus tachycardia    Urticaria      Allergies  Allergen Reactions   Other Anaphylaxis, Swelling and Other (See Comments)    Walnuts or any nuts   Sulfa Antibiotics Shortness  Of Breath and Other (See Comments)    Swelling of lips    Sunscreens Anaphylaxis, Hives and Other (See Comments)    Titanium and zinc oxides Eye swelling    Lamictal [Lamotrigine]     Weird Ticks    Seroquel [Quetiapine] Other (See Comments)    Pychosis (foggy, uncoordinated, slurred speech)   Tape Hives     Current Outpatient Medications  Medication Sig Dispense Refill   ALPRAZolam  (XANAX ) 0.25 MG tablet Take 0.25 mg by mouth 4 (four) times daily as needed for anxiety.      aspirin  EC 81 MG tablet Take 81 mg by mouth daily. Swallow whole.     atorvastatin  (LIPITOR) 40 MG tablet Take 1 tablet (40 mg total) by mouth daily. 30 tablet 1   azelastine  (ASTELIN ) 0.1 % nasal spray Place 2 sprays into both nostrils 2 (two) times daily as needed. Use in each nostril as directed 30 mL 11   dicyclomine  (BENTYL ) 10 MG capsule Take 1 capsule (10 mg total) by mouth 3 (three) times daily before meals. (Patient taking differently: Take 10 mg by mouth as needed for spasms.) 90 capsule 4   DULoxetine  (CYMBALTA ) 60 MG capsule Take 60 mg by mouth 2 (two) times daily.     Eyelid Cleansers (AVENOVA) 0.01 % SOLN Apply 1 application topically 2 (two) times daily as needed (eyelid cleanser). (Patient not  taking: Reported on 06/09/2023)     fluticasone  (FLONASE ) 50 MCG/ACT nasal spray Place 2 sprays into both nostrils daily. 16 g 11   levothyroxine  (SYNTHROID ) 50 MCG tablet Take 50 mcg by mouth daily.     pantoprazole  (PROTONIX ) 40 MG tablet Take 40 mg by mouth daily.     pregabalin  (LYRICA ) 150 MG capsule Take 1 capsule (150 mg total) by mouth 2 (two) times daily. 60 capsule 2   propranolol  (INDERAL ) 60 MG tablet Take 60 mg by mouth daily.     tiZANidine (ZANAFLEX) 4 MG tablet Take 4 mg by mouth 2 (two) times daily as needed.     Ubrogepant  (UBRELVY ) 100 MG TABS Take 1 tablet (100 mg total) by mouth as needed (take 1 tab at the earlist onset of a Migraine, May repeat in 2 hour. Max 2 tabs in 24 hours). 16 tablet 5    valsartan (DIOVAN) 80 MG tablet Take 80 mg by mouth daily.     No current facility-administered medications for this visit.     Past Surgical History:  Procedure Laterality Date   BREAST SURGERY     CERVICAL FUSION     CHOLECYSTECTOMY     COLONOSCOPY WITH PROPOFOL  N/A 01/16/2018   Procedure: COLONOSCOPY WITH PROPOFOL ;  Surgeon: Golda Claudis PENNER, MD;  Location: [facility];  Service: Endoscopy;  Laterality: N/A;  10:55   IR 3D INDEPENDENT WKST  12/13/2020   IR ANGIO INTRA EXTRACRAN SEL INTERNAL CAROTID BILAT MOD SED  12/13/2020   IR ANGIO VERTEBRAL SEL VERTEBRAL UNI L MOD SED  12/13/2020   IR CT HEAD LTD  12/13/2020   IR RADIOLOGIST EVAL & MGMT  11/30/2020   IR RADIOLOGIST EVAL & MGMT  01/10/2021   IR RADIOLOGIST EVAL & MGMT  05/14/2021   IR RADIOLOGIST EVAL & MGMT  05/01/2022   IR TRANSCATH/EMBOLIZ  12/13/2020   NASAL SINUS SURGERY     POLYPECTOMY  01/16/2018   Procedure: POLYPECTOMY;  Surgeon: Golda Claudis PENNER, MD;  Location: [facility];  Service: Endoscopy;;  colon   RADIOLOGY WITH ANESTHESIA N/A 12/13/2020   Procedure: IR WITH ANESTHESIA EBOLIZATION;  Surgeon: Dolphus Carrion, MD;  Location: [facility] OR;  Service: Radiology;  Laterality: N/A;   VAGINAL HYSTERECTOMY       Allergies  Allergen Reactions   Other Anaphylaxis, Swelling and Other (See Comments)    Walnuts or any nuts   Sulfa Antibiotics Shortness Of Breath and Other (See Comments)    Swelling of lips    Sunscreens Anaphylaxis, Hives and Other (See Comments)    Titanium and zinc oxides Eye swelling    Lamictal [Lamotrigine]     Weird Ticks    Seroquel [Quetiapine] Other (See Comments)    Pychosis (foggy, uncoordinated, slurred speech)   Tape Hives      Family History  Problem Relation Age of Onset   Depression Mother    Heart attack Mother    Depression Father    Depression Sister    Anxiety disorder Sister    Depression Maternal Grandmother    Stroke Maternal Grandmother    Heart attack Maternal Grandmother     Alcohol  abuse Maternal Grandfather    Heart attack Paternal Grandfather    Stroke Paternal Grandfather    Alcohol  abuse Maternal Uncle    Alcohol  abuse Paternal Uncle      Social History Teresa Galloway reports that she quit smoking about 11 years ago. Her smoking use included cigarettes. She started smoking about  48 years ago. She has a 37.2 pack-year smoking history. She has never used smokeless tobacco. Teresa Galloway reports current alcohol  use of about 3.0 standard drinks of alcohol  per week.    Physical Examination Today's Vitals   11/24/23 1015  BP: 114/68  Pulse: 83  SpO2: 98%  Weight: 143 lb (64.9 kg)  Height: 5' 6 (1.676 m)   Body mass index is 23.08 kg/m.  Gen: resting comfortably, no acute distress HEENT: no scleral icterus, pupils equal round and reactive, no palptable cervical adenopathy,  CV: RRR, no m/rg, no vjd Resp: Clear to auscultation bilaterally GI: abdomen is soft, non-tender, non-distended, normal bowel sounds, no hepatosplenomegaly MSK: extremities are warm, no edema.  Skin: warm, no rash Neuro:  no focal deficits Psych: appropriate affect   Diagnostic Studies  01/02/14 Echo Study Conclusions  - Left ventricle: The cavity size was normal. Systolic function was normal. The estimated ejection fraction was in the range of 55% to 60%. Wall motion was normal; there were no regional wall motion abnormalities. - Pericardium, extracardiac: A moderate pericardial effusion was identified. There was no echocardiographic evidence for tamponade physiology.   02/2014 Echo Study Conclusions  - Left ventricle: The cavity size was normal. Systolic function was   normal. The estimated ejection fraction was in the range of 60%   to 65%. Wall motion was normal; there were no regional wall   motion abnormalities. Doppler parameters are consistent with   abnormal left ventricular relaxation (grade 1 diastolic   dysfunction). - Pericardium, extracardiac: A  moderate pericardial effusion was   identified circumferential to the heart. The fluid exhibited a   fibrinous appearance.There was no evidence of hemodynamic   compromise. There was no chamber collapse. Respirophasic change   in stroke volume was normal. Features were not consistent with   tamponade physiology.  Impressions:  - Compared to the prior study, there has been a slight increase in   pericardial effusion along the posterior wall.   03/2014 Echo Study Conclusions  - Procedure narrative: Transthoracic echocardiography. Image   quality was fair, with suboptimal valvular visualization. - Left ventricle: The cavity size appeared normal. Intracavitary   diameter and wall thickness were not obtained. Systolic function   was vigorous. The estimated ejection fraction was in the range of   65% to 70%. Wall motion was normal; there were no regional wall   motion abnormalities. - Pericardium, extracardiac: Not well visualized. There appeared to   be a trivial to small circumferential pericardial effusion. There   was no evidence of hemodynamic compromise, with no right atrial   or ventricular diastolic collapse. There was normal respiratory   variation of the IVC. There was suboptimal valvular visualization   for the assessment of inflow variation.   Assessment and Plan   1. Descending aortic aneurysm - mild and stable,do for repeat imaging after 05/2023 appt     2. HTN - bp well controlled. Reports occasional high bp's at home, may be affected by technique. We discussed ideal steps for checking home bp's, she will update us  in 2 weeks on home numbers      Dorn PHEBE Ross, M.D.

## 2023-12-16 DIAGNOSIS — G4730 Sleep apnea, unspecified: Secondary | ICD-10-CM | POA: Diagnosis not present

## 2023-12-16 DIAGNOSIS — R410 Disorientation, unspecified: Secondary | ICD-10-CM | POA: Diagnosis not present

## 2023-12-16 DIAGNOSIS — I10 Essential (primary) hypertension: Secondary | ICD-10-CM | POA: Diagnosis not present

## 2023-12-16 DIAGNOSIS — R5383 Other fatigue: Secondary | ICD-10-CM | POA: Diagnosis not present

## 2023-12-16 DIAGNOSIS — Z299 Encounter for prophylactic measures, unspecified: Secondary | ICD-10-CM | POA: Diagnosis not present

## 2023-12-16 DIAGNOSIS — E7800 Pure hypercholesterolemia, unspecified: Secondary | ICD-10-CM | POA: Diagnosis not present

## 2023-12-26 DIAGNOSIS — Z299 Encounter for prophylactic measures, unspecified: Secondary | ICD-10-CM | POA: Diagnosis not present

## 2023-12-26 DIAGNOSIS — J069 Acute upper respiratory infection, unspecified: Secondary | ICD-10-CM | POA: Diagnosis not present

## 2023-12-26 DIAGNOSIS — U071 COVID-19: Secondary | ICD-10-CM | POA: Diagnosis not present

## 2023-12-26 DIAGNOSIS — R062 Wheezing: Secondary | ICD-10-CM | POA: Diagnosis not present

## 2023-12-31 DIAGNOSIS — M25551 Pain in right hip: Secondary | ICD-10-CM | POA: Diagnosis not present

## 2023-12-31 DIAGNOSIS — Z299 Encounter for prophylactic measures, unspecified: Secondary | ICD-10-CM | POA: Diagnosis not present

## 2024-01-13 ENCOUNTER — Telehealth: Payer: Self-pay | Admitting: Neurology

## 2024-01-13 NOTE — Telephone Encounter (Signed)
 Pt. Wants to cncl; all appts but have a conversation with nurse or Dr. Skeet on next steps, including medical  records xfr and xfr of care.Pt has already moved

## 2024-01-14 NOTE — Telephone Encounter (Signed)
 Per patient she [place] out of [place] and wants to have a Virtual visit with Dr.Jaffe before she switches her [facility] to [place] .  Advised patient we are unable to do VV out of state.

## 2024-01-23 ENCOUNTER — Telehealth: Payer: Self-pay | Admitting: Neurology

## 2024-01-23 NOTE — Telephone Encounter (Signed)
 Pt. Called in this afternoon and she just wanted to Thank you for taking good care of her. Pt has move to [place]. Thanks

## 2024-01-28 ENCOUNTER — Institutional Professional Consult (permissible substitution): Payer: PPO | Admitting: Psychology

## 2024-01-28 ENCOUNTER — Ambulatory Visit: Payer: Self-pay

## 2024-02-04 ENCOUNTER — Encounter: Payer: PPO | Admitting: Psychology

## 2024-02-11 ENCOUNTER — Other Ambulatory Visit: Payer: Self-pay | Admitting: Neurology

## 2024-03-16 ENCOUNTER — Ambulatory Visit: Admitting: Neurology
